# Patient Record
Sex: Female | Born: 1960 | Race: Black or African American | Hispanic: No | Smoking: Current every day smoker
Health system: Southern US, Community
[De-identification: ages and names within clinical notes are randomized; demographics above are authoritative.]

## PROBLEM LIST (undated history)

## (undated) DIAGNOSIS — R002 Palpitations: Secondary | ICD-10-CM

## (undated) DIAGNOSIS — Z9221 Personal history of antineoplastic chemotherapy: Secondary | ICD-10-CM

## (undated) DIAGNOSIS — F1290 Cannabis use, unspecified, uncomplicated: Secondary | ICD-10-CM

## (undated) DIAGNOSIS — G4733 Obstructive sleep apnea (adult) (pediatric): Secondary | ICD-10-CM

## (undated) DIAGNOSIS — G5603 Carpal tunnel syndrome, bilateral upper limbs: Secondary | ICD-10-CM

## (undated) DIAGNOSIS — Z9889 Other specified postprocedural states: Secondary | ICD-10-CM

## (undated) DIAGNOSIS — I209 Angina pectoris, unspecified: Secondary | ICD-10-CM

## (undated) DIAGNOSIS — R413 Other amnesia: Secondary | ICD-10-CM

## (undated) DIAGNOSIS — G5602 Carpal tunnel syndrome, left upper limb: Secondary | ICD-10-CM

## (undated) DIAGNOSIS — G935 Compression of brain: Secondary | ICD-10-CM

## (undated) DIAGNOSIS — Z7982 Long term (current) use of aspirin: Secondary | ICD-10-CM

## (undated) DIAGNOSIS — E559 Vitamin D deficiency, unspecified: Secondary | ICD-10-CM

## (undated) DIAGNOSIS — E119 Type 2 diabetes mellitus without complications: Secondary | ICD-10-CM

## (undated) DIAGNOSIS — C801 Malignant (primary) neoplasm, unspecified: Secondary | ICD-10-CM

## (undated) DIAGNOSIS — C50919 Malignant neoplasm of unspecified site of unspecified female breast: Secondary | ICD-10-CM

## (undated) DIAGNOSIS — R519 Headache, unspecified: Secondary | ICD-10-CM

## (undated) DIAGNOSIS — I2510 Atherosclerotic heart disease of native coronary artery without angina pectoris: Secondary | ICD-10-CM

## (undated) DIAGNOSIS — I700 Atherosclerosis of aorta: Secondary | ICD-10-CM

## (undated) DIAGNOSIS — I951 Orthostatic hypotension: Secondary | ICD-10-CM

## (undated) DIAGNOSIS — D72829 Elevated white blood cell count, unspecified: Secondary | ICD-10-CM

## (undated) DIAGNOSIS — E785 Hyperlipidemia, unspecified: Secondary | ICD-10-CM

## (undated) DIAGNOSIS — A599 Trichomoniasis, unspecified: Secondary | ICD-10-CM

## (undated) DIAGNOSIS — F17200 Nicotine dependence, unspecified, uncomplicated: Secondary | ICD-10-CM

## (undated) DIAGNOSIS — R011 Cardiac murmur, unspecified: Secondary | ICD-10-CM

## (undated) DIAGNOSIS — F32A Depression, unspecified: Secondary | ICD-10-CM

## (undated) DIAGNOSIS — A048 Other specified bacterial intestinal infections: Secondary | ICD-10-CM

## (undated) DIAGNOSIS — M810 Age-related osteoporosis without current pathological fracture: Secondary | ICD-10-CM

## (undated) DIAGNOSIS — M8580 Other specified disorders of bone density and structure, unspecified site: Secondary | ICD-10-CM

## (undated) DIAGNOSIS — M1990 Unspecified osteoarthritis, unspecified site: Secondary | ICD-10-CM

## (undated) DIAGNOSIS — D582 Other hemoglobinopathies: Secondary | ICD-10-CM

## (undated) DIAGNOSIS — I10 Essential (primary) hypertension: Secondary | ICD-10-CM

## (undated) DIAGNOSIS — J449 Chronic obstructive pulmonary disease, unspecified: Secondary | ICD-10-CM

## (undated) DIAGNOSIS — M160 Bilateral primary osteoarthritis of hip: Secondary | ICD-10-CM

## (undated) DIAGNOSIS — M5416 Radiculopathy, lumbar region: Secondary | ICD-10-CM

## (undated) DIAGNOSIS — F331 Major depressive disorder, recurrent, moderate: Secondary | ICD-10-CM

## (undated) DIAGNOSIS — Z972 Presence of dental prosthetic device (complete) (partial): Secondary | ICD-10-CM

## (undated) DIAGNOSIS — R001 Bradycardia, unspecified: Secondary | ICD-10-CM

## (undated) DIAGNOSIS — C50912 Malignant neoplasm of unspecified site of left female breast: Secondary | ICD-10-CM

## (undated) DIAGNOSIS — D649 Anemia, unspecified: Secondary | ICD-10-CM

## (undated) DIAGNOSIS — J8410 Pulmonary fibrosis, unspecified: Secondary | ICD-10-CM

## (undated) DIAGNOSIS — I739 Peripheral vascular disease, unspecified: Secondary | ICD-10-CM

## (undated) DIAGNOSIS — Z8669 Personal history of other diseases of the nervous system and sense organs: Secondary | ICD-10-CM

## (undated) HISTORY — PX: ANKLE FRACTURE SURGERY: SHX122

## (undated) HISTORY — PX: EYE SURGERY: SHX253

## (undated) HISTORY — PX: WISDOM TOOTH EXTRACTION: SHX21

## (undated) HISTORY — PX: ABDOMINAL HYSTERECTOMY: SHX81

## (undated) HISTORY — PX: OTHER SURGICAL HISTORY: SHX169

## (undated) HISTORY — DX: Essential (primary) hypertension: I10

---

## 2004-06-11 ENCOUNTER — Ambulatory Visit: Payer: Self-pay | Admitting: Family Medicine

## 2004-06-18 ENCOUNTER — Ambulatory Visit: Payer: Self-pay | Admitting: Family Medicine

## 2004-07-23 ENCOUNTER — Ambulatory Visit: Payer: Self-pay | Admitting: Obstetrics and Gynecology

## 2004-07-29 ENCOUNTER — Inpatient Hospital Stay: Payer: Self-pay | Admitting: Obstetrics and Gynecology

## 2004-11-23 ENCOUNTER — Other Ambulatory Visit: Payer: Self-pay

## 2004-11-23 ENCOUNTER — Emergency Department: Payer: Self-pay | Admitting: Emergency Medicine

## 2005-04-10 ENCOUNTER — Emergency Department: Payer: Self-pay | Admitting: Emergency Medicine

## 2005-04-11 ENCOUNTER — Emergency Department: Payer: Self-pay | Admitting: General Practice

## 2005-11-13 ENCOUNTER — Ambulatory Visit: Payer: Self-pay | Admitting: Family Medicine

## 2006-05-26 DIAGNOSIS — C50919 Malignant neoplasm of unspecified site of unspecified female breast: Secondary | ICD-10-CM

## 2006-05-26 DIAGNOSIS — C50912 Malignant neoplasm of unspecified site of left female breast: Secondary | ICD-10-CM

## 2006-05-26 HISTORY — DX: Malignant neoplasm of unspecified site of unspecified female breast: C50.919

## 2006-05-26 HISTORY — DX: Malignant neoplasm of unspecified site of left female breast: C50.912

## 2006-05-26 HISTORY — PX: MASTECTOMY: SHX3

## 2006-11-25 ENCOUNTER — Ambulatory Visit: Payer: Self-pay | Admitting: Family Medicine

## 2007-01-19 ENCOUNTER — Other Ambulatory Visit: Payer: Self-pay

## 2007-01-19 ENCOUNTER — Ambulatory Visit: Payer: Self-pay | Admitting: General Surgery

## 2007-01-19 IMAGING — CR DG CHEST 2V
1 series · 2 of 2 positions shown · non-contrast
Comparison: none

REASON FOR EXAM: htn,cardiac disease
COMMENTS:

PROCEDURE:     DXR - DXR CHEST PA (OR AP) AND LATERAL  - [DATE] [DATE]
RESULT:     Comparison is made to the prior exam of [DATE]. The lung
fields are clear. The heart, mediastinal and osseous structures are normal
in appearance.

[Series 1: view not recorded · 0.17mm/px · 2 of 2 slices shown]
[im 1/2]
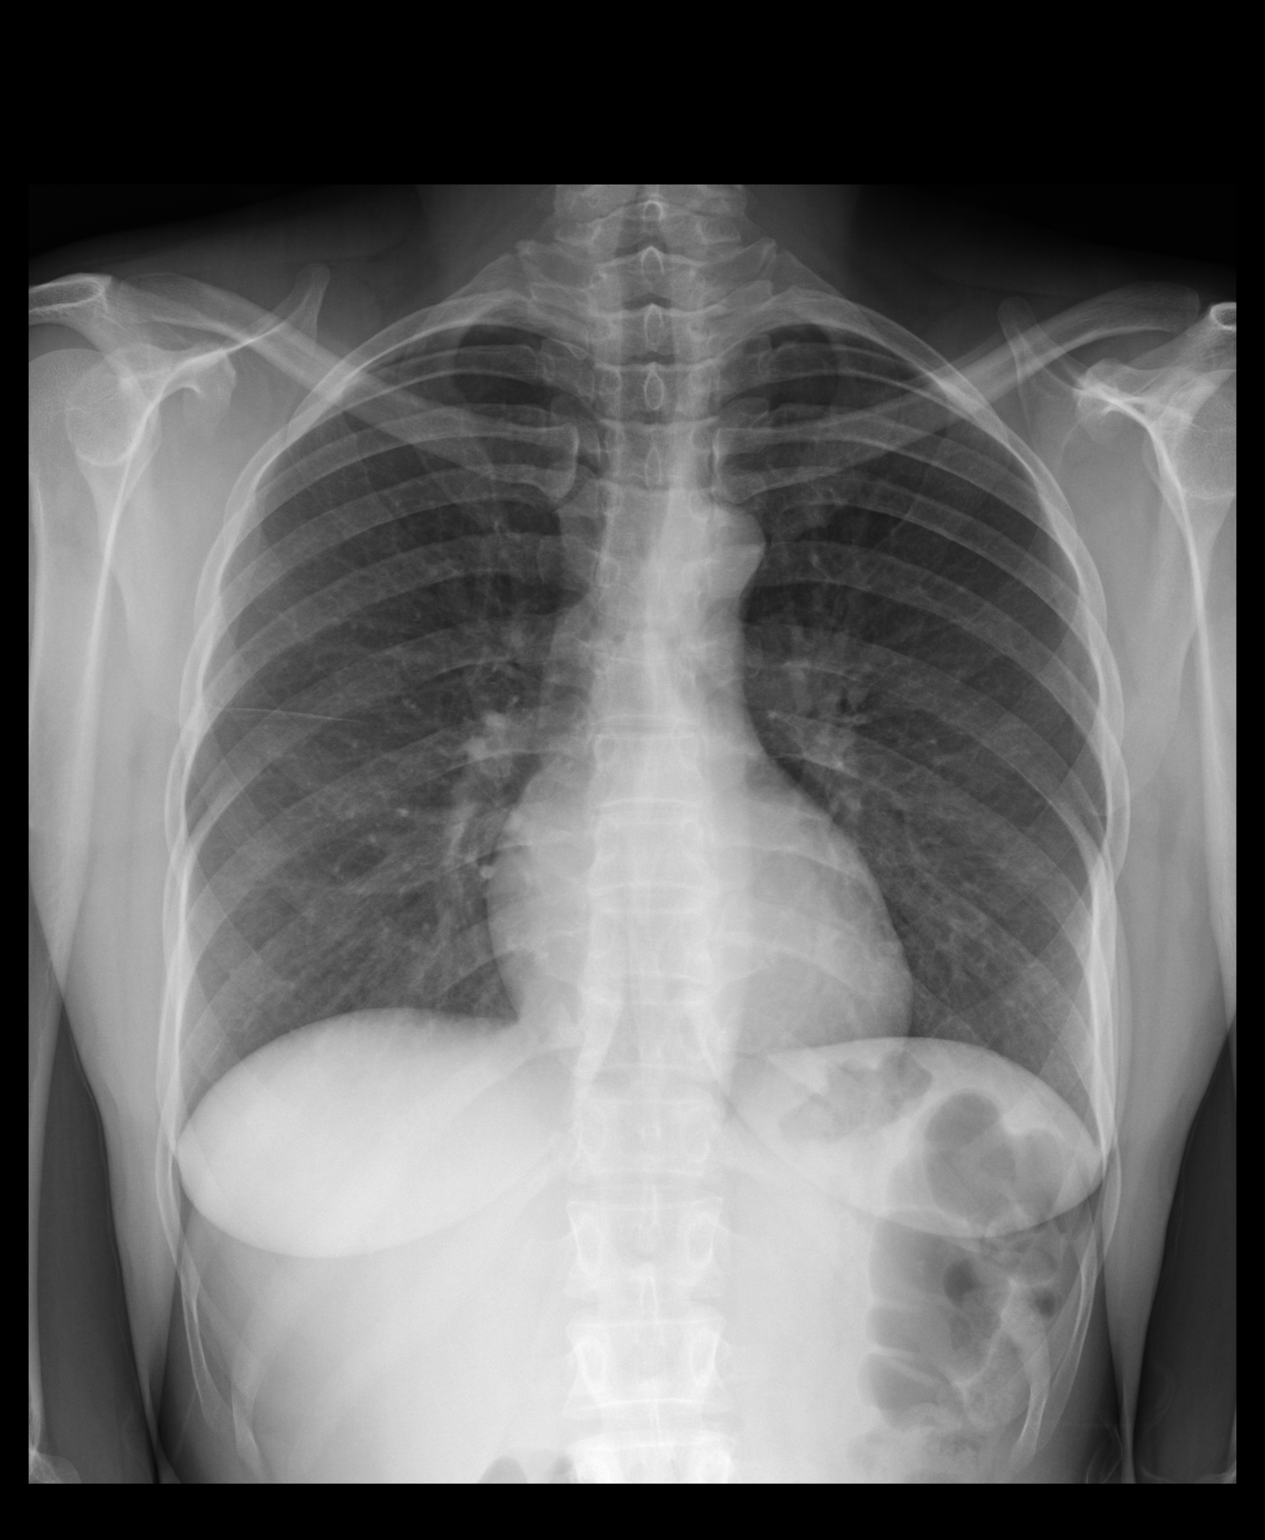
[im 2/2]
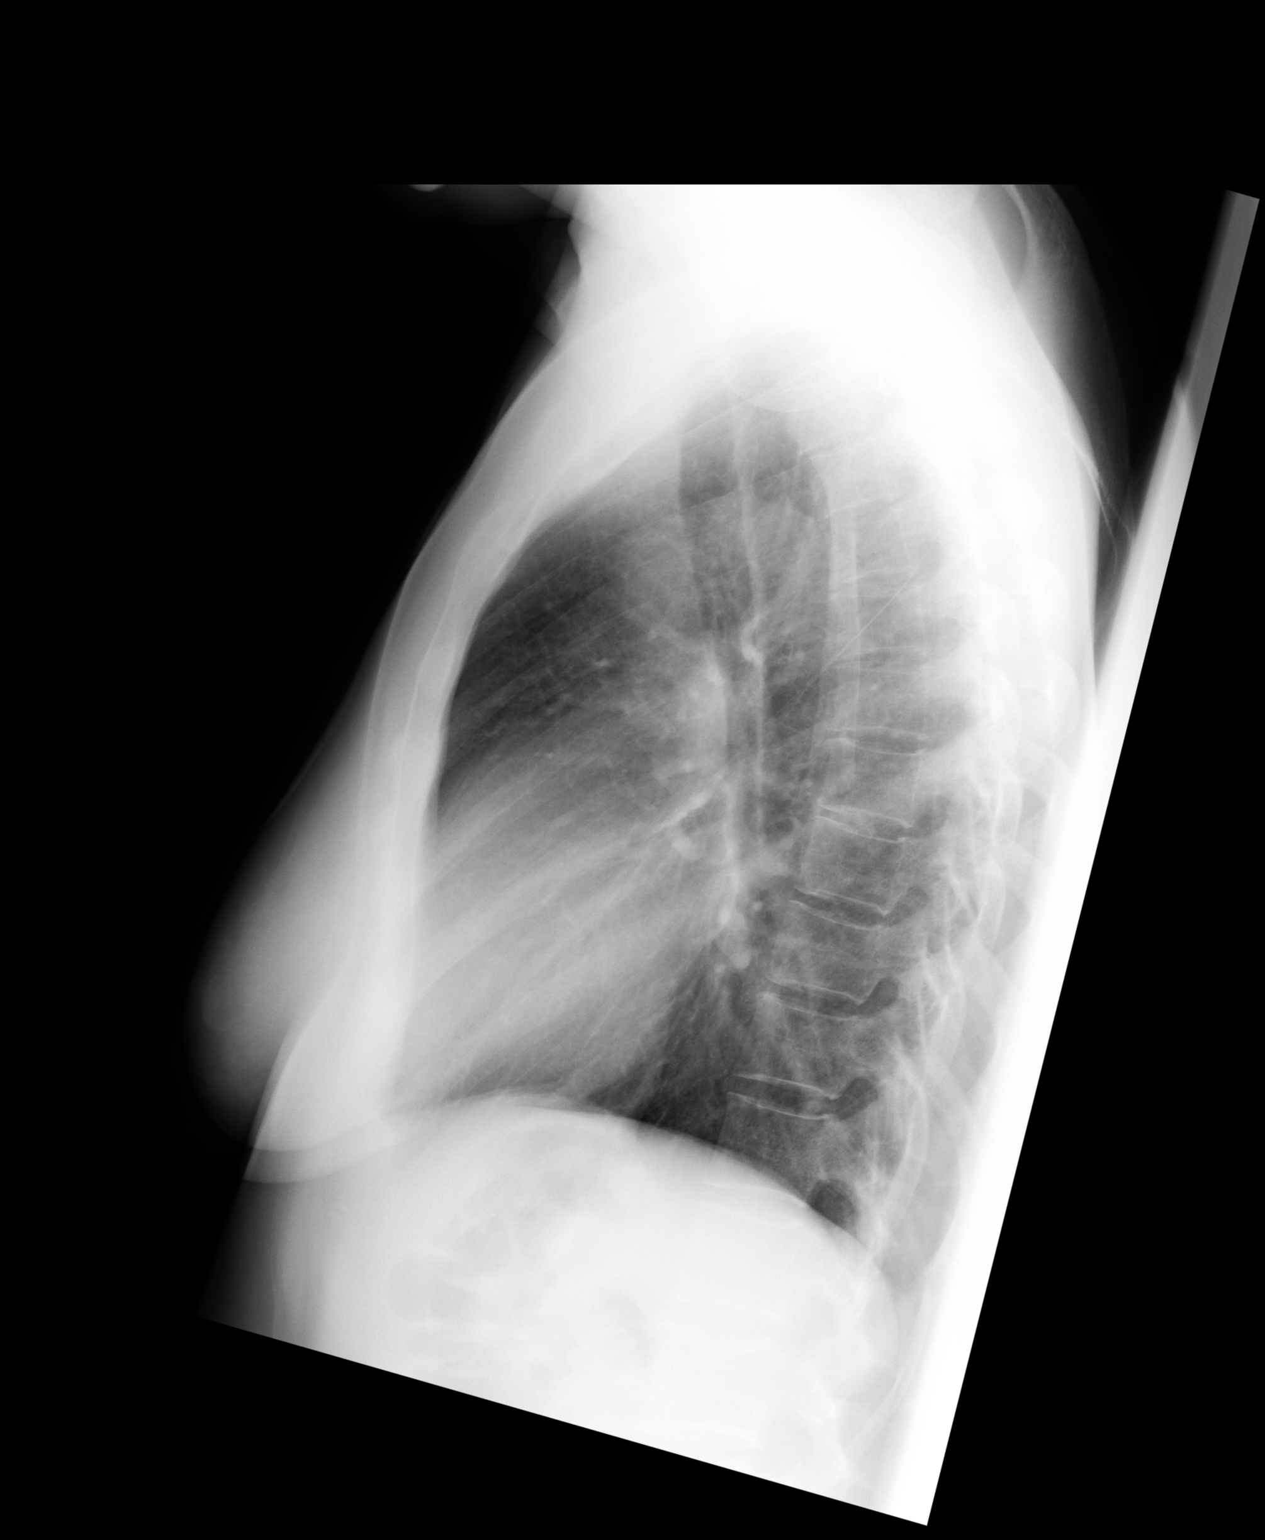

[2 of 2 positions shown; findings below may reference images not displayed]

IMPRESSION: No significant abnormalities are noted.

## 2007-01-25 ENCOUNTER — Ambulatory Visit: Payer: Self-pay | Admitting: Oncology

## 2007-01-26 ENCOUNTER — Inpatient Hospital Stay: Payer: Self-pay | Admitting: General Surgery

## 2007-02-16 ENCOUNTER — Ambulatory Visit: Payer: Self-pay | Admitting: Oncology

## 2007-02-23 ENCOUNTER — Ambulatory Visit: Payer: Self-pay | Admitting: General Surgery

## 2007-02-24 ENCOUNTER — Ambulatory Visit: Payer: Self-pay | Admitting: Oncology

## 2007-03-27 ENCOUNTER — Ambulatory Visit: Payer: Self-pay | Admitting: Oncology

## 2007-04-26 ENCOUNTER — Ambulatory Visit: Payer: Self-pay | Admitting: Oncology

## 2007-05-27 ENCOUNTER — Ambulatory Visit: Payer: Self-pay | Admitting: Oncology

## 2007-06-27 ENCOUNTER — Ambulatory Visit: Payer: Self-pay | Admitting: Oncology

## 2007-07-25 ENCOUNTER — Ambulatory Visit: Payer: Self-pay | Admitting: Oncology

## 2007-08-02 ENCOUNTER — Inpatient Hospital Stay: Payer: Self-pay | Admitting: Oncology

## 2007-08-02 ENCOUNTER — Other Ambulatory Visit: Payer: Self-pay

## 2007-08-03 ENCOUNTER — Other Ambulatory Visit: Payer: Self-pay

## 2007-08-09 ENCOUNTER — Inpatient Hospital Stay: Payer: Self-pay | Admitting: *Deleted

## 2007-08-09 ENCOUNTER — Other Ambulatory Visit: Payer: Self-pay

## 2007-08-09 DIAGNOSIS — I2111 ST elevation (STEMI) myocardial infarction involving right coronary artery: Secondary | ICD-10-CM

## 2007-08-09 HISTORY — PX: CORONARY ANGIOPLASTY WITH STENT PLACEMENT: SHX49

## 2007-08-09 HISTORY — DX: ST elevation (STEMI) myocardial infarction involving right coronary artery: I21.11

## 2007-08-25 ENCOUNTER — Ambulatory Visit: Payer: Self-pay | Admitting: Oncology

## 2007-09-24 ENCOUNTER — Ambulatory Visit: Payer: Self-pay | Admitting: Oncology

## 2007-10-25 ENCOUNTER — Ambulatory Visit: Payer: Self-pay | Admitting: Oncology

## 2007-11-02 ENCOUNTER — Ambulatory Visit: Payer: Self-pay | Admitting: Oncology

## 2007-11-24 ENCOUNTER — Ambulatory Visit: Payer: Self-pay | Admitting: Oncology

## 2007-12-01 ENCOUNTER — Ambulatory Visit: Payer: Self-pay | Admitting: General Surgery

## 2007-12-09 ENCOUNTER — Ambulatory Visit: Payer: Self-pay | Admitting: Oncology

## 2007-12-25 ENCOUNTER — Ambulatory Visit: Payer: Self-pay | Admitting: Oncology

## 2007-12-30 ENCOUNTER — Other Ambulatory Visit: Payer: Self-pay

## 2007-12-30 ENCOUNTER — Ambulatory Visit: Payer: Self-pay | Admitting: Orthopedic Surgery

## 2008-01-07 ENCOUNTER — Ambulatory Visit: Payer: Self-pay | Admitting: Orthopedic Surgery

## 2008-01-25 ENCOUNTER — Ambulatory Visit: Payer: Self-pay | Admitting: Oncology

## 2008-02-24 ENCOUNTER — Ambulatory Visit: Payer: Self-pay | Admitting: Oncology

## 2008-03-09 ENCOUNTER — Ambulatory Visit: Payer: Self-pay | Admitting: Oncology

## 2008-03-26 ENCOUNTER — Ambulatory Visit: Payer: Self-pay | Admitting: Oncology

## 2008-04-25 ENCOUNTER — Ambulatory Visit: Payer: Self-pay | Admitting: Oncology

## 2008-05-26 ENCOUNTER — Ambulatory Visit: Payer: Self-pay | Admitting: Oncology

## 2008-05-26 DIAGNOSIS — I219 Acute myocardial infarction, unspecified: Secondary | ICD-10-CM

## 2008-05-26 HISTORY — DX: Acute myocardial infarction, unspecified: I21.9

## 2008-06-08 ENCOUNTER — Ambulatory Visit: Payer: Self-pay | Admitting: Oncology

## 2008-06-26 ENCOUNTER — Ambulatory Visit: Payer: Self-pay | Admitting: Oncology

## 2008-08-24 ENCOUNTER — Ambulatory Visit: Payer: Self-pay | Admitting: Oncology

## 2008-09-07 ENCOUNTER — Ambulatory Visit: Payer: Self-pay | Admitting: Oncology

## 2008-09-23 ENCOUNTER — Ambulatory Visit: Payer: Self-pay | Admitting: Oncology

## 2008-11-08 ENCOUNTER — Ambulatory Visit: Payer: Self-pay | Admitting: Gastroenterology

## 2008-11-23 ENCOUNTER — Ambulatory Visit: Payer: Self-pay | Admitting: Oncology

## 2008-12-07 ENCOUNTER — Ambulatory Visit: Payer: Self-pay | Admitting: Oncology

## 2008-12-24 ENCOUNTER — Ambulatory Visit: Payer: Self-pay | Admitting: Oncology

## 2009-03-26 ENCOUNTER — Ambulatory Visit: Payer: Self-pay | Admitting: Oncology

## 2009-04-12 ENCOUNTER — Ambulatory Visit: Payer: Self-pay | Admitting: Oncology

## 2009-04-25 ENCOUNTER — Ambulatory Visit: Payer: Self-pay | Admitting: Oncology

## 2009-06-12 ENCOUNTER — Ambulatory Visit: Payer: Self-pay | Admitting: General Surgery

## 2009-07-03 ENCOUNTER — Ambulatory Visit: Payer: Self-pay | Admitting: General Surgery

## 2009-09-03 ENCOUNTER — Ambulatory Visit: Payer: Self-pay | Admitting: Family Medicine

## 2009-09-23 ENCOUNTER — Ambulatory Visit: Payer: Self-pay | Admitting: Oncology

## 2009-10-11 ENCOUNTER — Ambulatory Visit: Payer: Self-pay | Admitting: Oncology

## 2009-10-24 ENCOUNTER — Ambulatory Visit: Payer: Self-pay | Admitting: Oncology

## 2009-12-04 ENCOUNTER — Ambulatory Visit: Payer: Self-pay | Admitting: Oncology

## 2010-04-16 ENCOUNTER — Ambulatory Visit: Payer: Self-pay | Admitting: Oncology

## 2010-04-25 ENCOUNTER — Ambulatory Visit: Payer: Self-pay | Admitting: Oncology

## 2010-06-06 ENCOUNTER — Inpatient Hospital Stay: Payer: Self-pay | Admitting: Internal Medicine

## 2010-06-11 ENCOUNTER — Inpatient Hospital Stay: Payer: Self-pay | Admitting: Internal Medicine

## 2010-06-12 HISTORY — PX: LEFT HEART CATH AND CORONARY ANGIOGRAPHY: CATH118249

## 2010-07-04 ENCOUNTER — Ambulatory Visit: Payer: Self-pay | Admitting: Internal Medicine

## 2010-07-04 ENCOUNTER — Ambulatory Visit: Payer: Self-pay | Admitting: General Surgery

## 2010-10-15 ENCOUNTER — Ambulatory Visit: Payer: Self-pay | Admitting: Oncology

## 2010-10-25 ENCOUNTER — Ambulatory Visit: Payer: Self-pay | Admitting: Oncology

## 2010-11-01 ENCOUNTER — Emergency Department: Payer: Self-pay | Admitting: Emergency Medicine

## 2010-11-26 ENCOUNTER — Ambulatory Visit: Payer: Self-pay | Admitting: Family Medicine

## 2011-02-18 ENCOUNTER — Emergency Department: Payer: Self-pay | Admitting: Emergency Medicine

## 2011-02-27 ENCOUNTER — Ambulatory Visit: Payer: Self-pay | Admitting: Family Medicine

## 2011-04-21 ENCOUNTER — Ambulatory Visit: Payer: Self-pay | Admitting: Oncology

## 2011-04-26 ENCOUNTER — Ambulatory Visit: Payer: Self-pay | Admitting: Oncology

## 2011-05-27 ENCOUNTER — Ambulatory Visit: Payer: Self-pay | Admitting: Oncology

## 2011-07-07 ENCOUNTER — Ambulatory Visit: Payer: Self-pay | Admitting: Oncology

## 2011-10-27 ENCOUNTER — Ambulatory Visit: Payer: Self-pay | Admitting: Oncology

## 2011-10-28 LAB — CANCER ANTIGEN 27.29: CA 27.29: 18 U/mL (ref 0.0–38.6)

## 2011-11-24 ENCOUNTER — Ambulatory Visit: Payer: Self-pay | Admitting: Oncology

## 2012-07-06 ENCOUNTER — Ambulatory Visit: Payer: Self-pay | Admitting: Oncology

## 2012-08-20 ENCOUNTER — Ambulatory Visit: Payer: Self-pay | Admitting: Oncology

## 2012-08-23 LAB — CANCER ANTIGEN 27.29: CA 27.29: 18.9 U/mL (ref 0.0–38.6)

## 2012-08-24 ENCOUNTER — Ambulatory Visit: Payer: Self-pay | Admitting: Oncology

## 2012-09-23 ENCOUNTER — Ambulatory Visit: Payer: Self-pay | Admitting: Oncology

## 2012-10-24 ENCOUNTER — Ambulatory Visit: Payer: Self-pay | Admitting: Oncology

## 2012-11-01 ENCOUNTER — Ambulatory Visit: Payer: Self-pay | Admitting: Family Medicine

## 2013-04-08 ENCOUNTER — Ambulatory Visit: Payer: Self-pay | Admitting: Oncology

## 2013-04-09 LAB — CANCER ANTIGEN 27.29: CA 27.29: 17.5 U/mL (ref 0.0–38.6)

## 2013-04-25 ENCOUNTER — Ambulatory Visit: Payer: Self-pay | Admitting: Oncology

## 2013-09-27 ENCOUNTER — Ambulatory Visit: Payer: Self-pay | Admitting: Family Medicine

## 2013-11-02 ENCOUNTER — Ambulatory Visit: Payer: Self-pay | Admitting: Oncology

## 2014-04-25 ENCOUNTER — Ambulatory Visit: Payer: Self-pay | Admitting: Oncology

## 2014-05-05 LAB — CANCER ANTIGEN 27.29: CA 27.29: 21.2 U/mL (ref 0.0–38.6)

## 2014-05-26 ENCOUNTER — Ambulatory Visit: Payer: Self-pay | Admitting: Oncology

## 2014-09-08 ENCOUNTER — Other Ambulatory Visit: Payer: Self-pay | Admitting: Oncology

## 2014-09-08 DIAGNOSIS — Z853 Personal history of malignant neoplasm of breast: Secondary | ICD-10-CM

## 2015-02-28 ENCOUNTER — Ambulatory Visit
Admission: RE | Admit: 2015-02-28 | Discharge: 2015-02-28 | Disposition: A | Discharge status: Home / Self Care | Payer: Medicare Other | Source: Ambulatory Visit | Attending: Oncology | Admitting: Oncology

## 2015-02-28 ENCOUNTER — Other Ambulatory Visit: Payer: Self-pay | Admitting: Oncology

## 2015-02-28 DIAGNOSIS — Z853 Personal history of malignant neoplasm of breast: Secondary | ICD-10-CM

## 2015-02-28 DIAGNOSIS — Z1231 Encounter for screening mammogram for malignant neoplasm of breast: Secondary | ICD-10-CM | POA: Insufficient documentation

## 2015-02-28 HISTORY — DX: Malignant (primary) neoplasm, unspecified: C80.1

## 2015-02-28 HISTORY — DX: Malignant neoplasm of unspecified site of unspecified female breast: C50.919

## 2015-05-04 ENCOUNTER — Other Ambulatory Visit: Payer: Self-pay | Admitting: *Deleted

## 2015-05-04 DIAGNOSIS — C50919 Malignant neoplasm of unspecified site of unspecified female breast: Secondary | ICD-10-CM

## 2015-05-07 ENCOUNTER — Inpatient Hospital Stay: Payer: Medicare Other

## 2015-05-07 ENCOUNTER — Inpatient Hospital Stay: Payer: Medicare Other | Admitting: Oncology

## 2015-05-08 ENCOUNTER — Inpatient Hospital Stay: Payer: Medicare Other

## 2015-05-08 ENCOUNTER — Inpatient Hospital Stay: Payer: Medicare Other | Admitting: Oncology

## 2015-05-22 ENCOUNTER — Inpatient Hospital Stay: Payer: Medicare Other | Admitting: Oncology

## 2015-05-22 ENCOUNTER — Inpatient Hospital Stay: Payer: Medicare Other

## 2015-09-09 ENCOUNTER — Emergency Department
Admission: EM | Admit: 2015-09-09 | Discharge: 2015-09-09 | Disposition: A | Discharge status: Home / Self Care | Payer: Medicare Other | Attending: Emergency Medicine | Admitting: Emergency Medicine

## 2015-09-09 ENCOUNTER — Encounter: Payer: Self-pay | Admitting: Emergency Medicine

## 2015-09-09 DIAGNOSIS — H9202 Otalgia, left ear: Secondary | ICD-10-CM | POA: Diagnosis present

## 2015-09-09 DIAGNOSIS — Z853 Personal history of malignant neoplasm of breast: Secondary | ICD-10-CM | POA: Diagnosis not present

## 2015-09-09 DIAGNOSIS — H6092 Unspecified otitis externa, left ear: Secondary | ICD-10-CM | POA: Insufficient documentation

## 2015-09-09 DIAGNOSIS — F1290 Cannabis use, unspecified, uncomplicated: Secondary | ICD-10-CM | POA: Insufficient documentation

## 2015-09-09 DIAGNOSIS — H6692 Otitis media, unspecified, left ear: Secondary | ICD-10-CM | POA: Diagnosis not present

## 2015-09-09 DIAGNOSIS — F17210 Nicotine dependence, cigarettes, uncomplicated: Secondary | ICD-10-CM | POA: Insufficient documentation

## 2015-09-09 DIAGNOSIS — J029 Acute pharyngitis, unspecified: Secondary | ICD-10-CM | POA: Insufficient documentation

## 2015-09-09 LAB — POCT RAPID STREP A: Streptococcus, Group A Screen (Direct): NEGATIVE

## 2015-09-09 MED ORDER — TRAMADOL HCL 50 MG PO TABS: 50.0000 mg | ORAL_TABLET | Freq: Four times a day (QID) | ORAL | Status: DC | PRN

## 2015-09-09 MED ORDER — AMOXICILLIN 500 MG PO CAPS
500.0000 mg | ORAL_CAPSULE | Freq: Once | ORAL | Status: AC
  Administered 2015-09-09: 500 mg via ORAL

## 2015-09-09 MED ORDER — OFLOXACIN 0.3 % OT SOLN: 5.0000 [drp] | Freq: Two times a day (BID) | OTIC | Status: DC

## 2015-09-09 MED ORDER — GI COCKTAIL ~~LOC~~
30.0000 mL | Freq: Once | ORAL | Status: AC
  Administered 2015-09-09: 30 mL via ORAL
  Filled 2015-09-09: qty 30

## 2015-09-09 MED ORDER — AMOXICILLIN 500 MG PO CAPS
ORAL_CAPSULE | ORAL | Status: AC
  Filled 2015-09-09: qty 1

## 2015-09-09 MED ORDER — AMOXICILLIN 500 MG PO CAPS
ORAL_CAPSULE | ORAL | Status: AC
  Administered 2015-09-09: 500 mg via ORAL
  Filled 2015-09-09: qty 1

## 2015-09-09 MED ORDER — TRAMADOL HCL 50 MG PO TABS
50.0000 mg | ORAL_TABLET | Freq: Once | ORAL | Status: AC
Start: 2015-09-09 — End: 2015-09-09
  Administered 2015-09-09: 50 mg via ORAL
  Filled 2015-09-09: qty 1

## 2015-09-09 MED ORDER — AMOXICILLIN 500 MG PO TABS: 500.0000 mg | ORAL_TABLET | Freq: Two times a day (BID) | ORAL | Status: DC

## 2015-09-09 NOTE — ED Provider Notes (Signed)
[facility] Emergency Department Provider Note  Time seen: 9:56 PM  I have reviewed the triage vital signs and the nursing notes.   HISTORY  Chief Complaint Sore Throat; Otalgia; and Ingrown Toenail    HPI Tonya Keith is a 55 y.o. female with a past medical history of breast cancer presents the emergency department with a sore throat and left ear pain since Friday. According to the patient for the past 2 days she has had significant pain in her left ear as well as a left side of her throat. States low-grade fevers, denies vomiting. Denies headache. States she has had a mild amount of drainage from the left ear so she has been using a cotton ball in the ear for discomfort. Denies cough. Denies chest pain or abdominal pain. Patient states it's painful to swallow but no trouble breathing.     Past Medical History  Diagnosis Date  . Cancer [facility])     breast  . Breast cancer ([place]) 2008    chemo    There are no active problems to display for this patient.   Past Surgical History  Procedure Laterality Date  . Mastectomy Left 2008    No current outpatient prescriptions on file.  Allergies Pollen extract and Latex  Family History  Problem Relation Age of Onset  . Breast cancer Maternal Aunt     Social History Social History  Substance Use Topics  . Smoking status: Current Every Day Smoker -- 0.50 packs/day    Types: Cigarettes  . Smokeless tobacco: None  . Alcohol Use: Yes     Comment: occassional     Review of Systems Constitutional: Low-grade fever ENT: Left ear pain and drainage Cardiovascular: Negative for chest pain. Respiratory: Negative for shortness of breath. Negative for cough Gastrointestinal: Negative for abdominal pain Skin: Negative for rash. Neurological: Negative for headache 10-point ROS otherwise negative.  ____________________________________________   PHYSICAL EXAM:  VITAL SIGNS: ED Triage Vitals  Enc Vitals  Group     BP 09/09/15 2139 143/88 mmHg     Pulse Rate 09/09/15 2139 95     Resp 09/09/15 2139 20     Temp 09/09/15 2139 99.6 F (37.6 C)     Temp Source 09/09/15 2139 Oral     SpO2 09/09/15 2139 96 %     Weight 09/09/15 2139 112 lb (50.803 kg)     Height 09/09/15 2139 5\' 1"  (1.549 m)     Head Cir --      Peak Flow --      Pain Score 09/09/15 2140 10     Pain Loc --      Pain Edu? --      Excl. in [place]? --     Constitutional: Alert and oriented. Well appearing and in no distress. Eyes: Normal exam ENT   Head: Normocephalic and atraumatic.   Nose: No congestion/rhinnorhea.   Mouth/Throat: Mucous membranes are moist. Moderate erythema of the pharynx, no exudate or unilateral swelling, no uvular deviation. anterior cervical adenopathy left greater than right. Moderate erythema of left tympanic membrane with mild swelling of the left external auditory canal. Cardiovascular: Normal rate, regular rhythm. No murmurs, rubs, or gallops. Respiratory: Normal respiratory effort without tachypnea nor retractions. Breath sounds are clear and equal bilaterally. No wheezes/rales/rhonchi. Gastrointestinal: Soft and nontender. No distention. Musculoskeletal: Normal range of motion Neurologic:  Normal speech and language. No gross focal neurologic deficits Skin:  Skin is warm, dry and intact.  Psychiatric: Mood  and affect are normal. Speech and behavior are normal.   ____________________________________________    INITIAL IMPRESSION / ASSESSMENT AND PLAN / ED COURSE  Pertinent labs & imaging results that were available during my care of the patient were reviewed by me and considered in my medical decision making (see chart for details).  Exam consistent with pharyngitis with left otitis media/externa. We will obtain a strep swab, we'll place the patient on antibiotics amoxicillin as well as ofloxacin drops. Patient has moderate throat discomfort when swallowing, we will treat with a GI  cocktail the emergency department. I discussed supportive care with the patient including Tylenol/Motrin as well as Chloraseptic and increasing fluids. Patient agreeable.  Rapid strep negative.  ____________________________________________   FINAL CLINICAL IMPRESSION(S) / ED DIAGNOSES  Pharyngitis Left otitis media Left otitis externa   Harvest Dark, MD 09/09/15 2209

## 2015-09-09 NOTE — ED Notes (Signed)
Patient vomited Amoxicillin capsule back up.  MD ordered another capsule to try again.

## 2015-09-09 NOTE — Discharge Instructions (Signed)
Otitis Externa Otitis externa is a germ infection in the outer ear. The outer ear is the area from the eardrum to the outside of the ear. Otitis externa is sometimes called "swimmer's ear." HOME CARE  Put drops in the ear as told by your doctor.  Only take medicine as told by your doctor.  If you have diabetes, your doctor may give you more directions. Follow your doctor's directions.  Keep all doctor visits as told. To avoid another infection:  Keep your ear dry. Use the corner of a towel to dry your ear after swimming or bathing.  Avoid scratching or putting things inside your ear.  Avoid swimming in lakes, dirty water, or pools that use a chemical called chlorine poorly.  You may use ear drops after swimming. Combine equal amounts of white vinegar and alcohol in a bottle. Put 3 or 4 drops in each ear. GET HELP IF:   You have a fever.  Your ear is still red, puffy (swollen), or painful after 3 days.  You still have yellowish-white fluid (pus) coming from the ear after 3 days.  Your redness, puffiness, or pain gets worse.  You have a really bad headache.  You have redness, puffiness, pain, or tenderness behind your ear. MAKE SURE YOU:   Understand these instructions.  Will watch your condition.  Will get help right away if you are not doing well or get worse.   This information is not intended to replace advice given to you by your health care provider. Make sure you discuss any questions you have with your health care provider.   Document Released: 10/29/2007 Document Revised: 06/02/2014 Document Reviewed: 05/29/2011 Elsevier Interactive Patient Education 2016 [employer].  Otitis Media, Adult Otitis media is redness, soreness, and inflammation of the middle ear. Otitis media may be caused by allergies or, most commonly, by infection. Often it occurs as a complication of the common cold. SIGNS AND SYMPTOMS Symptoms of otitis media may  include:  Earache.  Fever.  Ringing in your ear.  Headache.  Leakage of fluid from the ear. DIAGNOSIS To diagnose otitis media, your health care provider will examine your ear with an otoscope. This is an instrument that allows your health care provider to see into your ear in order to examine your eardrum. Your health care provider also will ask you questions about your symptoms. TREATMENT  Typically, otitis media resolves on its own within 3-5 days. Your health care provider may prescribe medicine to ease your symptoms of pain. If otitis media does not resolve within 5 days or is recurrent, your health care provider may prescribe antibiotic medicines if he or she suspects that a bacterial infection is the cause. HOME CARE INSTRUCTIONS   If you were prescribed an antibiotic medicine, finish it all even if you start to feel better.  Take medicines only as directed by your health care provider.  Keep all follow-up visits as directed by your health care provider. SEEK MEDICAL CARE IF:  You have otitis media only in one ear, or bleeding from your nose, or both.  You notice a lump on your neck.  You are not getting better in 3-5 days.  You feel worse instead of better. SEEK IMMEDIATE MEDICAL CARE IF:   You have pain that is not controlled with medicine.  You have swelling, redness, or pain around your ear or stiffness in your neck.  You notice that part of your face is paralyzed.  You notice that the bone  behind your ear (mastoid) is tender when you touch it. MAKE SURE YOU:   Understand these instructions.  Will watch your condition.  Will get help right away if you are not doing well or get worse.   This information is not intended to replace advice given to you by your health care provider. Make sure you discuss any questions you have with your health care provider.   Document Released: 02/15/2004 Document Revised: 06/02/2014 Document Reviewed: 12/07/2012 Elsevier  Interactive Patient Education 2016 Elsevier Inc.  Pharyngitis Pharyngitis is redness, pain, and swelling (inflammation) of your pharynx.  CAUSES  Pharyngitis is usually caused by infection. Most of the time, these infections are from viruses (viral) and are part of a cold. However, sometimes pharyngitis is caused by bacteria (bacterial). Pharyngitis can also be caused by allergies. Viral pharyngitis may be spread from person to person by coughing, sneezing, and personal items or utensils (cups, forks, spoons, toothbrushes). Bacterial pharyngitis may be spread from person to person by more intimate contact, such as kissing.  SIGNS AND SYMPTOMS  Symptoms of pharyngitis include:   Sore throat.   Tiredness (fatigue).   Low-grade fever.   Headache.  Joint pain and muscle aches.  Skin rashes.  Swollen lymph nodes.  Plaque-like film on throat or tonsils (often seen with bacterial pharyngitis). DIAGNOSIS  Your health care provider will ask you questions about your illness and your symptoms. Your medical history, along with a physical exam, is often all that is needed to diagnose pharyngitis. Sometimes, a rapid strep test is done. Other lab tests may also be done, depending on the suspected cause.  TREATMENT  Viral pharyngitis will usually get better in 3-4 days without the use of medicine. Bacterial pharyngitis is treated with medicines that kill germs (antibiotics).  HOME CARE INSTRUCTIONS   Drink enough water and fluids to keep your urine clear or pale yellow.   Only take over-the-counter or prescription medicines as directed by your health care provider:   If you are prescribed antibiotics, make sure you finish them even if you start to feel better.   Do not take aspirin.   Get lots of rest.   Gargle with 8 oz of salt water ( tsp of salt per 1 qt of water) as often as every 1-2 hours to soothe your throat.   Throat lozenges (if you are not at risk for choking) or  sprays may be used to soothe your throat. SEEK MEDICAL CARE IF:   You have large, tender lumps in your neck.  You have a rash.  You cough up green, yellow-brown, or bloody spit. SEEK IMMEDIATE MEDICAL CARE IF:   Your neck becomes stiff.  You drool or are unable to swallow liquids.  You vomit or are unable to keep medicines or liquids down.  You have severe pain that does not go away with the use of recommended medicines.  You have trouble breathing (not caused by a stuffy nose). MAKE SURE YOU:   Understand these instructions.  Will watch your condition.  Will get help right away if you are not doing well or get worse.   This information is not intended to replace advice given to you by your health care provider. Make sure you discuss any questions you have with your health care provider.   Document Released: 05/12/2005 Document Revised: 03/02/2013 Document Reviewed: 01/17/2013 Elsevier Interactive Patient Education [employer].

## 2015-09-09 NOTE — ED Notes (Signed)
Pt in with complaints of sore throat, left ear pain since Friday; pt reports difficulty swallowing due to pain.  Pt reports nausea/vomiting x2 today.  Pt reports fever 101 last night.  Pt A/Ox4, temp 99.6, other vitals WDL.

## 2015-09-11 LAB — CULTURE, GROUP A STREP (THRC)

## 2016-01-29 ENCOUNTER — Other Ambulatory Visit: Payer: Self-pay | Admitting: Family Medicine

## 2016-01-29 DIAGNOSIS — Z1231 Encounter for screening mammogram for malignant neoplasm of breast: Secondary | ICD-10-CM

## 2016-03-05 ENCOUNTER — Ambulatory Visit
Admission: RE | Admit: 2016-03-05 | Discharge: 2016-03-05 | Disposition: A | Discharge status: Home / Self Care | Payer: Medicare Other | Source: Ambulatory Visit | Attending: Family Medicine | Admitting: Family Medicine

## 2016-03-05 ENCOUNTER — Other Ambulatory Visit: Payer: Self-pay | Admitting: Family Medicine

## 2016-03-05 DIAGNOSIS — Z1231 Encounter for screening mammogram for malignant neoplasm of breast: Secondary | ICD-10-CM

## 2016-03-05 DIAGNOSIS — Z9012 Acquired absence of left breast and nipple: Secondary | ICD-10-CM | POA: Insufficient documentation

## 2016-09-30 ENCOUNTER — Other Ambulatory Visit: Payer: Self-pay | Admitting: Family Medicine

## 2016-09-30 DIAGNOSIS — M810 Age-related osteoporosis without current pathological fracture: Secondary | ICD-10-CM

## 2016-10-31 ENCOUNTER — Ambulatory Visit
Admission: RE | Admit: 2016-10-31 | Discharge: 2016-10-31 | Disposition: A | Discharge status: Home / Self Care | Payer: Medicare Other | Source: Ambulatory Visit | Attending: Family Medicine | Admitting: Family Medicine

## 2016-10-31 ENCOUNTER — Other Ambulatory Visit: Payer: Self-pay | Admitting: Family Medicine

## 2016-10-31 DIAGNOSIS — M25561 Pain in right knee: Secondary | ICD-10-CM

## 2016-11-27 ENCOUNTER — Other Ambulatory Visit: Payer: Medicare Other

## 2017-01-28 ENCOUNTER — Other Ambulatory Visit: Payer: Self-pay | Admitting: Family Medicine

## 2017-01-28 DIAGNOSIS — Z1231 Encounter for screening mammogram for malignant neoplasm of breast: Secondary | ICD-10-CM

## 2017-03-10 ENCOUNTER — Ambulatory Visit
Admission: RE | Admit: 2017-03-10 | Discharge: 2017-03-10 | Disposition: A | Discharge status: Home / Self Care | Payer: Medicare Other | Source: Ambulatory Visit | Attending: Family Medicine | Admitting: Family Medicine

## 2017-03-10 DIAGNOSIS — Z1231 Encounter for screening mammogram for malignant neoplasm of breast: Secondary | ICD-10-CM | POA: Diagnosis not present

## 2017-11-17 ENCOUNTER — Ambulatory Visit (INDEPENDENT_AMBULATORY_CARE_PROVIDER_SITE_OTHER): Payer: Medicare HMO | Admitting: Vascular Surgery

## 2017-11-17 ENCOUNTER — Encounter (INDEPENDENT_AMBULATORY_CARE_PROVIDER_SITE_OTHER): Payer: Self-pay | Admitting: Vascular Surgery

## 2017-11-17 VITALS — BP 111/71 | HR 71 | Resp 13 | Ht 60.0 in | Wt 122.0 lb

## 2017-11-17 DIAGNOSIS — M79605 Pain in left leg: Secondary | ICD-10-CM | POA: Diagnosis not present

## 2017-11-17 DIAGNOSIS — E785 Hyperlipidemia, unspecified: Secondary | ICD-10-CM

## 2017-11-17 DIAGNOSIS — M79604 Pain in right leg: Secondary | ICD-10-CM

## 2017-11-17 DIAGNOSIS — E7800 Pure hypercholesterolemia, unspecified: Secondary | ICD-10-CM | POA: Insufficient documentation

## 2017-11-17 DIAGNOSIS — R600 Localized edema: Secondary | ICD-10-CM | POA: Diagnosis not present

## 2017-11-17 DIAGNOSIS — F17200 Nicotine dependence, unspecified, uncomplicated: Secondary | ICD-10-CM | POA: Insufficient documentation

## 2017-11-17 DIAGNOSIS — I10 Essential (primary) hypertension: Secondary | ICD-10-CM | POA: Insufficient documentation

## 2017-11-17 DIAGNOSIS — G8929 Other chronic pain: Secondary | ICD-10-CM | POA: Insufficient documentation

## 2017-11-17 DIAGNOSIS — Z720 Tobacco use: Secondary | ICD-10-CM | POA: Insufficient documentation

## 2017-11-17 NOTE — Progress Notes (Signed)
Subjective:    Patient ID: Tonya Keith, female    DOB: 08-04-1960, 57 y.o.   MRN: [id number] Chief Complaint  Patient presents with  . New Patient (Initial Visit)    Claudication   Presents as a new patient referred by Dr. Clide Deutscher for evaluation of bilateral lower extremity pain.  The patient endorses a one-time episode of bilateral calf pain associated with some swelling which occurred approximately 3 weeks ago.  The patient denies any cramping to the buttocks, the thigh or the calves.  Describes his discomfort as a "pain" in her calves with activity.  The patient also notes that her bilateral ankles were "swollen".  The patient notes that she does elevate her legs on a daily basis.  The patient does not wear medical grade 1 compression socks.  The patient denies any continued claudication-like symptoms, rest pain or ulceration to the bilateral lower extremity.  Patient denies any recent surgery or trauma to the bilateral lower extremity.  Patient notes that she is very active and exercises a lot.  Denies any erythema to the bilateral lower extremity.  Patient denies any fever, nausea vomiting.  Review of Systems  Constitutional: Negative.   HENT: Negative.   Eyes: Negative.   Respiratory: Negative.   Cardiovascular: Positive for leg swelling.       Bilateral calf pain  Gastrointestinal: Negative.   Endocrine: Negative.   Genitourinary: Negative.   Musculoskeletal: Negative.   Skin: Negative.   Allergic/Immunologic: Negative.   Neurological: Negative.   Hematological: Negative.   Psychiatric/Behavioral: Negative.       Objective:   Physical Exam  Constitutional: She is oriented to person, place, and time. She appears well-developed and well-nourished. No distress.  HENT:  Head: Normocephalic and atraumatic.  Right Ear: External ear normal.  Left Ear: External ear normal.  Eyes: Pupils are equal, round, and reactive to light. Conjunctivae and EOM are normal.  Neck: Normal range  of motion.  Cardiovascular: Normal rate, regular rhythm, normal heart sounds and intact distal pulses.  Pulses:      Radial pulses are 2+ on the right side, and 2+ on the left side.       Dorsalis pedis pulses are 1+ on the right side, and 1+ on the left side.       Posterior tibial pulses are 1+ on the right side, and 1+ on the left side.  Pulmonary/Chest: Effort normal and breath sounds normal.  Musculoskeletal: Normal range of motion. She exhibits edema (Mild nonpitting edema noted to the bilateral ankles).  Neurological: She is alert and oriented to person, place, and time.  Skin: Skin is warm and dry. She is not diaphoretic.  There is no stasis dermatitis, fibrosis, cellulitis or ulcerations noted to the bilateral lower extremity.  There are no varicosities noted.  Psychiatric: She has a normal mood and affect. Her behavior is normal. Judgment and thought content normal.  Vitals reviewed.  BP 111/71 (BP Location: Right Arm, Patient Position: Sitting)   Pulse 71   Resp 13   Ht 5' (1.524 m)   Wt 122 lb (55.3 kg)   BMI 23.83 kg/m   Past Medical History:  Diagnosis Date  . Breast cancer ([place]) 2008   chemo mastectomy left  . Cancer [facility])    breast  . Hypertension    Social History   Socioeconomic History  . Marital status: Divorced    Spouse name: Not on file  . Number of children: Not on file  .  Years of education: Not on file  . Highest education level: Not on file  Occupational History  . Not on file  Social Needs  . Financial resource strain: Not on file  . Food insecurity:    Worry: Not on file    Inability: Not on file  . Transportation needs:    Medical: Not on file    Non-medical: Not on file  Tobacco Use  . Smoking status: Current Every Day Smoker    Packs/day: 0.50    Types: Cigarettes  . Smokeless tobacco: Never Used  Substance and Sexual Activity  . Alcohol use: Yes    Comment: occassional   . Drug use: Yes    Types: Marijuana    Comment: "every  once in a while"  . Sexual activity: Not on file  Lifestyle  . Physical activity:    Days per week: Not on file    Minutes per session: Not on file  . Stress: Not on file  Relationships  . Social connections:    Talks on phone: Not on file    Gets together: Not on file    Attends religious service: Not on file    Active member of club or organization: Not on file    Attends meetings of clubs or organizations: Not on file    Relationship status: Not on file  . Intimate partner violence:    Fear of current or ex partner: Not on file    Emotionally abused: Not on file    Physically abused: Not on file    Forced sexual activity: Not on file  Other Topics Concern  . Not on file  Social History Narrative  . Not on file   Past Surgical History:  Procedure Laterality Date  . ABDOMINAL HYSTERECTOMY    . MASTECTOMY Left 2008   Family History  Problem Relation Age of Onset  . Breast cancer Maternal Aunt   . Heart disease Father   . Hypertension Father   . Cancer Sister    Allergies  Allergen Reactions  . Pollen Extract   . Latex Itching and Rash      Assessment & Plan:  Presents as a new patient referred by Dr. Clide Deutscher for evaluation of bilateral lower extremity pain.  The patient endorses a one-time episode of bilateral calf pain associated with some swelling which occurred approximately 3 weeks ago.  The patient denies any cramping to the buttocks, the thigh or the calves.  Describes his discomfort as a "pain" in her calves with activity.  The patient also notes that her bilateral ankles were "swollen".  The patient notes that she does elevate her legs on a daily basis.  The patient does not wear medical grade 1 compression socks.  The patient denies any continued claudication-like symptoms, rest pain or ulceration to the bilateral lower extremity.  Patient denies any recent surgery or trauma to the bilateral lower extremity.  Patient notes that she is very active and exercises a  lot.  Denies any erythema to the bilateral lower extremity.  Patient denies any fever, nausea vomiting.  1. Bilateral lower extremity pain - New Patient has multiple risk factors for peripheral artery disease Presents with bilateral calf pain Faint pedal pulses on exam I will have the patient undergo a bilateral ABI to assess for any contributing peripheral artery disease I have discussed with the patient at length the risk factors for and pathogenesis of atherosclerotic disease and encouraged a healthy diet, regular exercise regimen and  blood pressure / glucose control.  The patient was encouraged to call the office in the interim if he experiences any claudication like symptoms, rest pain or ulcers to his feet / toes.  - US Arterial Seg Multiple; Future  2. Bilateral lower extremity edema - New The patient was encouraged to wear graduated compression stockings (20-30 mmHg) on a daily basis. The patient was instructed to begin wearing the stockings first thing in the morning and removing them in the evening. The patient was instructed specifically not to sleep in the stockings. Prescription given.  In addition, behavioral modification including elevation during the day will be initiated. Anti-inflammatories for pain. The patient will follow up in three months to asses conservative management.  Information on chronic venous insufficiency and compression stockings was given to the patient. The patient was instructed to call the office in the interim if any worsening edema or ulcerations to the legs, feet or toes occurs. The patient expresses their understanding.  3. Tobacco abuse - Stable We had a discussion for approximately 5 minutes regarding the absolute need for smoking cessation due to the deleterious nature of tobacco on the vascular system. We discussed the tobacco use would diminish patency of any intervention, and likely significantly worsen progressio of disease. We discussed multiple  agents for quitting including replacement therapy or medications to reduce cravings such as Chantix. The patient voices their understanding of the importance of smoking cessation.  4. Essential hypertension - Stable Encouraged good control as its slows the progression of atherosclerotic disease  5. Hyperlipidemia, unspecified hyperlipidemia type - Stable Encouraged good control as its slows the progression of atherosclerotic disease  Current Outpatient Medications on File Prior to Visit  Medication Sig Dispense Refill  . amitriptyline (ELAVIL) 25 MG tablet     . amLODipine (NORVASC) 10 MG tablet     . aspirin EC 81 MG tablet Take by mouth.    . cetirizine (ZYRTEC) 10 MG tablet Take by mouth.    . cloNIDine (CATAPRES) 0.2 MG tablet     . losartan-hydrochlorothiazide (HYZAAR) 100-12.5 MG tablet Take by mouth.    . lovastatin (MEVACOR) 40 MG tablet Take by mouth.    . metoprolol succinate (TOPROL-XL) 100 MG 24 hr tablet     . ofloxacin (FLOXIN) 0.3 % otic solution Place 5 drops into the left ear 2 (two) times daily. For 7 days 5 mL 0  . exemestane (AROMASIN) 25 MG tablet Take by mouth.     No current facility-administered medications on file prior to visit.    There are no Patient Instructions on file for this visit. No follow-ups on file.  [employer] A Brailyn Killion, PA-C

## 2017-12-03 ENCOUNTER — Emergency Department: Payer: Medicare Other

## 2017-12-03 ENCOUNTER — Inpatient Hospital Stay
Admission: EM | Admit: 2017-12-03 | Discharge: 2017-12-04 | DRG: 923 | Disposition: A | Discharge status: Home / Self Care | Payer: Medicare Other | Attending: Internal Medicine | Admitting: Internal Medicine

## 2017-12-03 ENCOUNTER — Other Ambulatory Visit: Payer: Self-pay

## 2017-12-03 DIAGNOSIS — F17210 Nicotine dependence, cigarettes, uncomplicated: Secondary | ICD-10-CM | POA: Diagnosis present

## 2017-12-03 DIAGNOSIS — I10 Essential (primary) hypertension: Secondary | ICD-10-CM | POA: Diagnosis not present

## 2017-12-03 DIAGNOSIS — Z9012 Acquired absence of left breast and nipple: Secondary | ICD-10-CM

## 2017-12-03 DIAGNOSIS — T68XXXA Hypothermia, initial encounter: Principal | ICD-10-CM | POA: Diagnosis present

## 2017-12-03 DIAGNOSIS — Z91048 Other nonmedicinal substance allergy status: Secondary | ICD-10-CM

## 2017-12-03 DIAGNOSIS — C50912 Malignant neoplasm of unspecified site of left female breast: Secondary | ICD-10-CM | POA: Diagnosis not present

## 2017-12-03 DIAGNOSIS — Z7982 Long term (current) use of aspirin: Secondary | ICD-10-CM | POA: Diagnosis not present

## 2017-12-03 DIAGNOSIS — Z91040 Latex allergy status: Secondary | ICD-10-CM

## 2017-12-03 DIAGNOSIS — Z79899 Other long term (current) drug therapy: Secondary | ICD-10-CM

## 2017-12-03 DIAGNOSIS — E876 Hypokalemia: Secondary | ICD-10-CM | POA: Diagnosis present

## 2017-12-03 DIAGNOSIS — R42 Dizziness and giddiness: Secondary | ICD-10-CM | POA: Diagnosis present

## 2017-12-03 DIAGNOSIS — Z79811 Long term (current) use of aromatase inhibitors: Secondary | ICD-10-CM | POA: Diagnosis not present

## 2017-12-03 DIAGNOSIS — A419 Sepsis, unspecified organism: Secondary | ICD-10-CM

## 2017-12-03 DIAGNOSIS — Z803 Family history of malignant neoplasm of breast: Secondary | ICD-10-CM | POA: Diagnosis not present

## 2017-12-03 LAB — CBC
HEMATOCRIT: 39.7 % (ref 35.0–47.0)
HEMOGLOBIN: 13.9 g/dL (ref 12.0–16.0)
MCH: 28.4 pg (ref 26.0–34.0)
MCHC: 34.9 g/dL (ref 32.0–36.0)
MCV: 81.4 fL (ref 80.0–100.0)
Platelets: 265 10*3/uL (ref 150–440)
RBC: 4.88 MIL/uL (ref 3.80–5.20)
RDW: 15.3 % — ABNORMAL HIGH (ref 11.5–14.5)
WBC: 12.1 10*3/uL — ABNORMAL HIGH (ref 3.6–11.0)

## 2017-12-03 LAB — URINALYSIS, COMPLETE (UACMP) WITH MICROSCOPIC
Bacteria, UA: NONE SEEN
Bilirubin Urine: NEGATIVE
GLUCOSE, UA: NEGATIVE mg/dL
Hgb urine dipstick: NEGATIVE
Ketones, ur: NEGATIVE mg/dL
Leukocytes, UA: NEGATIVE
Nitrite: NEGATIVE
PH: 7 (ref 5.0–8.0)
Protein, ur: NEGATIVE mg/dL
SPECIFIC GRAVITY, URINE: 1.01 (ref 1.005–1.030)

## 2017-12-03 LAB — URINE DRUG SCREEN, QUALITATIVE (ARMC ONLY)
Amphetamines, Ur Screen: NOT DETECTED
Benzodiazepine, Ur Scrn: NOT DETECTED
COCAINE METABOLITE, UR ~~LOC~~: NOT DETECTED
Cannabinoid 50 Ng, Ur ~~LOC~~: POSITIVE — AB
MDMA (ECSTASY) UR SCREEN: NOT DETECTED
METHADONE SCREEN, URINE: NOT DETECTED
Opiate, Ur Screen: NOT DETECTED
Phencyclidine (PCP) Ur S: NOT DETECTED
TRICYCLIC, UR SCREEN: POSITIVE — AB

## 2017-12-03 LAB — COMPREHENSIVE METABOLIC PANEL
ALK PHOS: 86 U/L (ref 38–126)
ALT: 20 U/L (ref 0–44)
ANION GAP: 9 (ref 5–15)
AST: 23 U/L (ref 15–41)
Albumin: 4.1 g/dL (ref 3.5–5.0)
BUN: 12 mg/dL (ref 6–20)
CALCIUM: 9.6 mg/dL (ref 8.9–10.3)
CO2: 27 mmol/L (ref 22–32)
Chloride: 105 mmol/L (ref 98–111)
Creatinine, Ser: 0.97 mg/dL (ref 0.44–1.00)
GFR calc non Af Amer: >60 mL/min (ref >60)
GLUCOSE: 143 mg/dL — AB (ref 70–99)
Potassium: 4.1 mmol/L (ref 3.5–5.1)
Sodium: 141 mmol/L (ref 135–145)
Total Bilirubin: 0.6 mg/dL (ref 0.3–1.2)
Total Protein: 7.7 g/dL (ref 6.5–8.1)

## 2017-12-03 LAB — LACTIC ACID, PLASMA: Lactic Acid, Venous: 1.7 mmol/L (ref 0.5–1.9)

## 2017-12-03 LAB — TSH: TSH: 0.95 u[IU]/mL (ref 0.350–4.500)

## 2017-12-03 LAB — TROPONIN I

## 2017-12-03 MED ORDER — PROCHLORPERAZINE EDISYLATE 10 MG/2ML IJ SOLN
10.0000 mg | Freq: Once | INTRAMUSCULAR | Status: AC
Start: 2017-12-03 — End: 2017-12-03
  Administered 2017-12-03: 10 mg via INTRAVENOUS
  Filled 2017-12-03: qty 2

## 2017-12-03 MED ORDER — ENOXAPARIN SODIUM 40 MG/0.4ML ~~LOC~~ SOLN
40.0000 mg | SUBCUTANEOUS | Status: DC
  Administered 2017-12-03: 40 mg via SUBCUTANEOUS
  Filled 2017-12-03: qty 0.4

## 2017-12-03 MED ORDER — ONDANSETRON HCL 4 MG PO TABS
4.0000 mg | ORAL_TABLET | Freq: Four times a day (QID) | ORAL | Status: DC | PRN
  Filled 2017-12-03: qty 1

## 2017-12-03 MED ORDER — ONDANSETRON HCL 4 MG/2ML IJ SOLN: 4.0000 mg | Freq: Four times a day (QID) | INTRAMUSCULAR | Status: DC | PRN

## 2017-12-03 MED ORDER — ASPIRIN EC 81 MG PO TBEC
81.0000 mg | DELAYED_RELEASE_TABLET | Freq: Every day | ORAL | Status: DC
  Administered 2017-12-03 – 2017-12-04 (×2): 81 mg via ORAL
  Filled 2017-12-03 (×2): qty 1

## 2017-12-03 MED ORDER — ALBUTEROL SULFATE (2.5 MG/3ML) 0.083% IN NEBU: 2.5000 mg | INHALATION_SOLUTION | RESPIRATORY_TRACT | Status: DC | PRN

## 2017-12-03 MED ORDER — MECLIZINE HCL 25 MG PO TABS
25.0000 mg | ORAL_TABLET | Freq: Once | ORAL | Status: AC
Start: 2017-12-03 — End: 2017-12-03
  Administered 2017-12-03: 25 mg via ORAL
  Filled 2017-12-03: qty 1

## 2017-12-03 MED ORDER — ACETAMINOPHEN 325 MG PO TABS: 650.0000 mg | ORAL_TABLET | Freq: Four times a day (QID) | ORAL | Status: DC | PRN

## 2017-12-03 MED ORDER — VANCOMYCIN HCL IN DEXTROSE 1-5 GM/200ML-% IV SOLN
1000.0000 mg | Freq: Once | INTRAVENOUS | Status: AC
  Administered 2017-12-03: 1000 mg via INTRAVENOUS
  Filled 2017-12-03 (×2): qty 200

## 2017-12-03 MED ORDER — SODIUM CHLORIDE 0.9 % IV BOLUS
1000.0000 mL | Freq: Once | INTRAVENOUS | Status: AC
  Administered 2017-12-03: 1000 mL via INTRAVENOUS

## 2017-12-03 MED ORDER — POLYETHYLENE GLYCOL 3350 17 G PO PACK
17.0000 g | PACK | Freq: Every day | ORAL | Status: DC | PRN
  Filled 2017-12-03: qty 1

## 2017-12-03 MED ORDER — HYDRALAZINE HCL 20 MG/ML IJ SOLN
10.0000 mg | Freq: Four times a day (QID) | INTRAMUSCULAR | Status: DC | PRN
  Filled 2017-12-03: qty 0.5

## 2017-12-03 MED ORDER — IBUPROFEN 400 MG PO TABS: 400.0000 mg | ORAL_TABLET | Freq: Four times a day (QID) | ORAL | Status: DC | PRN

## 2017-12-03 MED ORDER — PIPERACILLIN-TAZOBACTAM 3.375 G IVPB 30 MIN
3.3750 g | Freq: Once | INTRAVENOUS | Status: AC
  Administered 2017-12-03: 3.375 g via INTRAVENOUS
  Filled 2017-12-03 (×2): qty 50

## 2017-12-03 MED ORDER — ACETAMINOPHEN 650 MG RE SUPP
650.0000 mg | Freq: Four times a day (QID) | RECTAL | Status: DC | PRN
  Filled 2017-12-03: qty 1

## 2017-12-03 MED ORDER — SODIUM CHLORIDE 0.9% FLUSH
3.0000 mL | Freq: Two times a day (BID) | INTRAVENOUS | Status: DC
  Administered 2017-12-03 – 2017-12-04 (×2): 3 mL via INTRAVENOUS

## 2017-12-03 MED ORDER — DIPHENHYDRAMINE HCL 50 MG/ML IJ SOLN
25.0000 mg | Freq: Once | INTRAMUSCULAR | Status: AC
  Administered 2017-12-03: 25 mg via INTRAVENOUS
  Filled 2017-12-03: qty 1

## 2017-12-03 NOTE — ED Notes (Signed)
Patient returned from radiology

## 2017-12-03 NOTE — ED Notes (Signed)
Patient transported to X-ray 

## 2017-12-03 NOTE — ED Notes (Signed)
EKG machine wasn't working in triage room. Pt taken to CT at this time. Will obtain EKG when pt returns.

## 2017-12-03 NOTE — Progress Notes (Signed)
CODE SEPSIS - PHARMACY COMMUNICATION  **Broad Spectrum Antibiotics should be administered within 1 hour of Sepsis diagnosis**  Time Code Sepsis Called/Page Received: 1814 1st code sepsis was cancelled due to negative initial work-up but subsequently called in light of further findings Antibiotics Ordered: vancomycin and Zosyn  Time of 1st antibiotic administration: 1824  Additional action taken by pharmacy: in contact with RN providing care  If necessary, Name of Provider/Nurse Contacted: Margaretmary Lombard ,PharmD Clinical Pharmacist  12/03/2017  6:37 PM

## 2017-12-03 NOTE — ED Notes (Signed)
bair hugger placed on pt at this time due to low temp

## 2017-12-03 NOTE — ED Triage Notes (Signed)
Pt states she fell on 7/4. States she hit posterior head. Denies LOC. Denies blood thinners. Alert, oriented, answering questions, speaking in clear complete sentences. States N&V, dizziness. States blurred vision, states both eyes. States "swimmy headed."

## 2017-12-03 NOTE — H&P (Signed)
[facility] [place] NAME: Mellie Buccellato    MR#:  [id number]  DATE OF BIRTH:  02-17-1961  DATE OF ADMISSION:  12/03/2017  PRIMARY CARE PHYSICIAN: [facility]   REQUESTING/REFERRING PHYSICIAN: Dr. Clearnce Hasten  CHIEF COMPLAINT:   Chief Complaint  Patient presents with  . Fall  . Head Injury    HISTORY OF PRESENT ILLNESS:  Shery Wauneka  is a 57 y.o. female with a known history of hypertension, breast cancer presents to the emergency room due to having profuse sweating, lightheaded and dizzy.  Patient here in the emergency room has been found to have rectal temperature of 93.6.  No clear etiology found.  Was given IV antibiotics and is being admitted to the hospitalist service.  She had a fall on July 4 where she hit back of the head.  Had CT scan of the head and was normal.  Today this was repeated and normal without any hemorrhage.  She has not had any recent change in medications.  Was sweating and felt cold to the daughter even out in the hot sun.  No history of thyroid problems.  PAST MEDICAL HISTORY:   Past Medical History:  Diagnosis Date  . Breast cancer ([place]) 2008   chemo mastectomy left  . Cancer ([place])    breast  . Hypertension     PAST SURGICAL HISTORY:   Past Surgical History:  Procedure Laterality Date  . ABDOMINAL HYSTERECTOMY    . MASTECTOMY Left 2008    SOCIAL HISTORY:   Social History   Tobacco Use  . Smoking status: Current Every Day Smoker    Packs/day: 0.50    Types: Cigarettes  . Smokeless tobacco: Never Used  Substance Use Topics  . Alcohol use: Yes    Comment: occassional     FAMILY HISTORY:   Family History  Problem Relation Age of Onset  . Breast cancer Maternal Aunt   . Heart disease Father   . Hypertension Father   . Cancer Sister     DRUG ALLERGIES:   Allergies  Allergen Reactions  . Pollen Extract   . Latex Itching and Rash    REVIEW OF SYSTEMS:   Review of  Systems  Constitutional: Positive for chills and malaise/fatigue. Negative for fever.  HENT: Negative for sore throat.   Eyes: Negative for blurred vision, double vision and pain.  Respiratory: Negative for cough, hemoptysis, shortness of breath and wheezing.   Cardiovascular: Negative for chest pain, palpitations, orthopnea and leg swelling.  Gastrointestinal: Negative for abdominal pain, constipation, diarrhea, heartburn, nausea and vomiting.  Genitourinary: Negative for dysuria and hematuria.  Musculoskeletal: Negative for back pain and joint pain.  Skin: Negative for rash.  Neurological: Positive for dizziness and weakness. Negative for sensory change, speech change, focal weakness and headaches.  Endo/Heme/Allergies: Does not bruise/bleed easily.  Psychiatric/Behavioral: Negative for depression. The patient is not nervous/anxious.     MEDICATIONS AT HOME:   Prior to Admission medications   Medication Sig Start Date End Date Taking? Authorizing Provider  amitriptyline (ELAVIL) 25 MG tablet  05/31/09   [provider]  amLODipine (NORVASC) 10 MG tablet  10/27/17   [provider]  aspirin EC 81 MG tablet Take by mouth.    [provider]  cetirizine (ZYRTEC) 10 MG tablet Take by mouth.    [provider]  cloNIDine (CATAPRES) 0.2 MG tablet  10/27/17   [provider]  exemestane (AROMASIN) 25  MG tablet Take by mouth.    [provider]  losartan-hydrochlorothiazide (HYZAAR) 100-12.5 MG tablet Take by mouth.    [provider]  lovastatin (MEVACOR) 40 MG tablet Take by mouth.    [provider]  metoprolol succinate (TOPROL-XL) 100 MG 24 hr tablet  10/27/17   [provider]  ofloxacin (FLOXIN) 0.3 % otic solution Place 5 drops into the left ear 2 (two) times daily. For 7 days 09/09/15   Harvest Dark, MD     VITAL SIGNS:  Blood pressure 103/70, pulse 74, temperature (!) 93.6 F (34.2 C), temperature  source Rectal, resp. rate 17, height 5' (1.524 m), weight 55.3 kg (122 lb), SpO2 97 %.  PHYSICAL EXAMINATION:  Physical Exam  GENERAL:  57 y.o.-year-old patient lying in the bed , restless EYES: Pupils equal, round, reactive to light and accommodation. No scleral icterus. Extraocular muscles intact.  HEENT: Head atraumatic, normocephalic. Oropharynx and nasopharynx clear. No oropharyngeal erythema, moist oral mucosa  NECK:  Supple, no jugular venous distention. No thyroid enlargement, no tenderness.  LUNGS: Normal breath sounds bilaterally, no wheezing, rales, rhonchi. No use of accessory muscles of respiration.  CARDIOVASCULAR: S1, S2 normal. No murmurs, rubs, or gallops.  ABDOMEN: Soft, nontender, nondistended. Bowel sounds present. No organomegaly or mass.  EXTREMITIES: No pedal edema, cyanosis, or clubbing. + 2 pedal & radial pulses b/l.   NEUROLOGIC: Cranial nerves II through XII are intact. No focal Motor or sensory deficits appreciated b/l PSYCHIATRIC: The patient is alert and oriented x 3.  Anxious SKIN: No obvious rash, lesion, or ulcer.   LABORATORY PANEL:   CBC Recent Labs  Lab 12/03/17 1512  WBC 12.1*  HGB 13.9  HCT 39.7  PLT 265   ------------------------------------------------------------------------------------------------------------------  Chemistries  Recent Labs  Lab 12/03/17 1512  NA 141  K 4.1  CL 105  CO2 27  GLUCOSE 143*  BUN 12  CREATININE 0.97  CALCIUM 9.6  AST 23  ALT 20  ALKPHOS 86  BILITOT 0.6   ------------------------------------------------------------------------------------------------------------------  Cardiac Enzymes Recent Labs  Lab 12/03/17 1512  TROPONINI <0.03   ------------------------------------------------------------------------------------------------------------------  RADIOLOGY:  Dg Chest 2 View  Result Date: 12/03/2017 CLINICAL DATA:  Nausea, vomiting and dizziness. History of breast cancer, hypertension and  smoking. EXAM: CHEST - 2 VIEW COMPARISON:  06/11/2010 FINDINGS: The heart size and mediastinal contours are within normal limits. Increased interstitial lung markings with mild peribronchial thickening consistent with smoking related bronchitic change. No alveolar consolidation or CHF. The visualized skeletal structures are unremarkable. IMPRESSION: Smoking related bronchitic change of the lungs. Electronically Signed   By: Ashley Royalty M.D.   On: 12/03/2017 17:37   Ct Head Wo Contrast  Result Date: 12/03/2017 CLINICAL DATA:  [place] on 11/26/2017, striking the back of the head. Patient denies loss of consciousness. Current symptoms include nausea and vomiting, dizziness and blurred vision. Initial encounter. Personal history of LEFT breast cancer post mastectomy in 2008. EXAM: CT HEAD WITHOUT CONTRAST TECHNIQUE: Contiguous axial images were obtained from the base of the skull through the vertex without intravenous contrast. COMPARISON:  08/05/2007 FINDINGS: Brain: Ventricular system normal in size and appearance for age. No mass lesion. No midline shift. No acute hemorrhage or hematoma. No extra-axial fluid collections. No evidence of acute infarction. No focal brain parenchymal abnormalities. Low-lying cerebellar tonsils as noted previously. Vascular: Minimal LEFT carotid siphon atherosclerosis. No hyperdense vessel. Skull: No skull fracture or other focal osseous abnormality involving the skull. Sinuses/Orbits: Visualized paranasal sinuses, bilateral mastoid air  cells and bilateral middle ear cavities well-aerated. Frontal sinuses and LEFT sphenoid sinus hypoplastic. Visualized orbits and globes normal in appearance. Other: None. IMPRESSION: No acute intracranial abnormality. Electronically Signed   By: Evangeline Dakin M.D.   On: 12/03/2017 15:37     IMPRESSION AND PLAN:   *Hypothermia.  Etiology is unclear.  Patient has normal blood pressure, heart rate.  Hemoglobin stable.  BUN and creatinine and  electrolytes normal.  Will check TSH and cortisol level.  CT scan of the head shows no acute hemorrhage.  Patient has received 1 dose of IV vancomycin and Zosyn.  Will not continue antibiotics at this time.  Cultures pending.  No signs of infection. Bair hugger placed. Telemetry monitoring  *Hypertension.  Hold medications at this time.  Ordered IV hydralazine as needed if blood pressure becomes elevated.  *DVT prophylaxis with Lovenox  All the records are reviewed and case discussed with ED provider. Management plans discussed with the patient, family and they are in agreement.  CODE STATUS: Full code  TOTAL CC TIME TAKING CARE OF THIS PATIENT: 40 minutes.   Neita Carp M.D on 12/03/2017 at 6:47 PM  Between 7am to 6pm - Pager - [phone]  After 6pm go to [email or web address] - password [email or web address] [facility]  Office  [phone]  CC: Primary care physician; [facility]  Note: This dictation was prepared with Diplomatic Services operational officer dictation along with smaller phrase technology. Any transcriptional errors that result from this process are unintentional.

## 2017-12-03 NOTE — ED Provider Notes (Signed)
[facility] Emergency Department Provider Note ____________________________________________   First MD Initiated Contact with Patient 12/03/17 1535     (approximate)  I have reviewed the triage vital signs and the nursing notes.   HISTORY  Chief Complaint Fall and Head Injury   HPI Tonya Keith is a 57 y.o. female with a history of hypertension as well as breast cancer who is presenting to the emergency department today with lightheadedness, dizziness and nausea ever since falling on July 4.  She says that she went to sit in the chair and missed and fell backward, hitting the back of her head.  However, she denies any pain.  She says that the symptoms have been worsening.  She says that when she opens her eyes the dizziness becomes worse.  Says that she has been diaphoretic today.  Denies any abdominal pain.  Says that she is also been very nauseous today as well.  History of migraine headaches but denies any head pain or neck pain at this time.  Past Medical History:  Diagnosis Date  . Breast cancer ([place]) 2008   chemo mastectomy left  . Cancer [facility])    breast  . Hypertension     Patient Active Problem List   Diagnosis Date Noted  . Bilateral lower extremity pain 11/17/2017  . Bilateral lower extremity edema 11/17/2017  . Tobacco abuse 11/17/2017  . Essential hypertension 11/17/2017  . Hyperlipidemia 11/17/2017    Past Surgical History:  Procedure Laterality Date  . ABDOMINAL HYSTERECTOMY    . MASTECTOMY Left 2008    Prior to Admission medications   Medication Sig Start Date End Date Taking? Authorizing Provider  amitriptyline (ELAVIL) 25 MG tablet  05/31/09   [provider]  amLODipine (NORVASC) 10 MG tablet  10/27/17   [provider]  aspirin EC 81 MG tablet Take by mouth.    [provider]  cetirizine (ZYRTEC) 10 MG tablet Take by mouth.    [provider]  cloNIDine (CATAPRES) 0.2 MG tablet  10/27/17    [provider]  exemestane (AROMASIN) 25 MG tablet Take by mouth.    [provider]  losartan-hydrochlorothiazide (HYZAAR) 100-12.5 MG tablet Take by mouth.    [provider]  lovastatin (MEVACOR) 40 MG tablet Take by mouth.    [provider]  metoprolol succinate (TOPROL-XL) 100 MG 24 hr tablet  10/27/17   [provider]  ofloxacin (FLOXIN) 0.3 % otic solution Place 5 drops into the left ear 2 (two) times daily. For 7 days 09/09/15   Harvest Dark, MD    Allergies Pollen extract and Latex  Family History  Problem Relation Age of Onset  . Breast cancer Maternal Aunt   . Heart disease Father   . Hypertension Father   . Cancer Sister     Social History Social History   Tobacco Use  . Smoking status: Current Every Day Smoker    Packs/day: 0.50    Types: Cigarettes  . Smokeless tobacco: Never Used  Substance Use Topics  . Alcohol use: Yes    Comment: occassional   . Drug use: Yes    Types: Marijuana    Comment: "every once in a while"    Review of Systems  Constitutional: No fever/chills Eyes: No visual changes. ENT: No sore throat. Cardiovascular: Denies chest pain. Respiratory: Denies shortness of breath. Gastrointestinal: No abdominal pain.  no vomiting.  No diarrhea.  No constipation. Genitourinary: Negative for dysuria. Musculoskeletal: Negative  for back pain. Skin: Negative for rash. Neurological: Negative for headaches, focal weakness or numbness.   ____________________________________________   PHYSICAL EXAM:  VITAL SIGNS: ED Triage Vitals  Enc Vitals Group     BP 12/03/17 1512 119/73     Pulse Rate 12/03/17 1512 (!) 55     Resp 12/03/17 1512 18     Temp 12/03/17 1636 (!) 93.7 F (34.3 C)     Temp Source 12/03/17 1636 Rectal     SpO2 12/03/17 1512 100 %     Weight 12/03/17 1512 122 lb (55.3 kg)     Height 12/03/17 1512 5' (1.524 m)     Head Circumference --      Peak Flow --      Pain Score  12/03/17 1512 10     Pain Loc --      Pain Edu? --      Excl. in [place]? --     Constitutional: Alert and oriented.  Patient appears uncomfortable.  In the fetal position on the bed.  Tearing and diaphoretic. Eyes: Conjunctivae are normal.  No nystagmus.  Pupils are equal, round and reactive to light. Head: Atraumatic. Nose: No congestion/rhinnorhea. Mouth/Throat: Mucous membranes are moist.  Neck: No stridor.  No tenderness to palpation of midline cervical spine.  No deformity or step-off. Cardiovascular: Normal rate, regular rhythm. Grossly normal heart sounds.   Respiratory: Normal respiratory effort.  No retractions. Lungs CTAB. Gastrointestinal: Soft and nontender. No distention. No CVA tenderness. Musculoskeletal: No lower extremity tenderness nor edema.  No joint effusions. Neurologic:  Normal speech and language. No gross focal neurologic deficits are appreciated. Skin:  Skin is warm, dry and intact. No rash noted. Psychiatric: Mood and affect are normal. Speech and behavior are normal.  ____________________________________________   LABS (all labs ordered are listed, but only abnormal results are displayed)  Labs Reviewed  CBC - Abnormal; Notable for the following components:      Result Value   WBC 12.1 (*)    RDW 15.3 (*)    All other components within normal limits  COMPREHENSIVE METABOLIC PANEL - Abnormal; Notable for the following components:   Glucose, Bld 143 (*)    All other components within normal limits  URINALYSIS, COMPLETE (UACMP) WITH MICROSCOPIC - Abnormal; Notable for the following components:   Color, Urine YELLOW (*)    APPearance CLEAR (*)    All other components within normal limits  CULTURE, BLOOD (ROUTINE X 2)  CULTURE, BLOOD (ROUTINE X 2)  URINE CULTURE  TROPONIN I  LACTIC ACID, PLASMA  LACTIC ACID, PLASMA  TSH  CORTISOL   ____________________________________________  EKG  ED ECG REPORT I, Doran Stabler, the attending physician,  personally viewed and interpreted this ECG.   Date: 12/03/2017  EKG Time: 1530  Rate: 62  Rhythm: normal sinus rhythm  Axis: Normal  Intervals:none  ST&T Change: No ST segment elevation or depression.  No abnormal T wave inversion.  ____________________________________________  RADIOLOGY  CT head without any acute process.  No acute process on the chest x-ray. ____________________________________________   PROCEDURES  Procedure(s) performed:   .Critical Care Performed by: Orbie Pyo, MD Authorized by: Orbie Pyo, MD   Critical care provider statement:    Critical care time (minutes):  35   Critical care time was exclusive of:  Separately billable procedures and treating other patients   Critical care was necessary to treat or prevent imminent or life-threatening deterioration of the following conditions:  Sepsis  Critical care was time spent personally by me on the following activities:  Development of treatment plan with patient or surrogate, discussions with consultants, evaluation of patient's response to treatment, examination of patient, obtaining history from patient or surrogate, ordering and performing treatments and interventions, ordering and review of laboratory studies, ordering and review of radiographic studies, pulse oximetry, re-evaluation of patient's condition and review of old charts    Critical Care performed:   ____________________________________________   INITIAL IMPRESSION / [place] / ED COURSE  Pertinent labs & imaging results that were available during my care of the patient were reviewed by me and considered in my medical decision making (see chart for details).  DDX: Sepsis, UTI, pneumonia, bacteremia, concussion, migraine, hypothermia, hypothyroidism As part of my medical decision making, I reviewed the following data within the electronic MEDICAL RECORD NUMBER Notes from prior ED  visits  ----------------------------------------- 6:23 PM on 12/03/2017 -----------------------------------------  Despite being placed on the bear hugger the patient is still with a low core temperature of 93.  TSH pending.  Possible sepsis of unknown source at this time.  Reassuring chest x-ray as well as urinalysis.  Patient be admitted to the hospital and placed on broad-spectrum antibiotics.  Initial complaint was for lightheadedness.  No headache no neck pain.  Patient ranging head neck freely without meningismus.  Signed out to Dr. Darvin Neighbours.  Patient aware of need for admission the hospital and willing to comply. ____________________________________________   FINAL CLINICAL IMPRESSION(S) / ED DIAGNOSES  Final diagnoses:  Sepsis, due to unspecified organism ([place])  Vertigo.    NEW MEDICATIONS STARTED DURING THIS VISIT:  New Prescriptions   No medications on file     Note:  This document was prepared using Dragon voice recognition software and may include unintentional dictation errors.     Orbie Pyo, MD 12/03/17 [phone]

## 2017-12-03 NOTE — ED Notes (Signed)
Admission MD at bedside.  

## 2017-12-03 NOTE — ED Notes (Signed)
Patient placed under bear hugger.  Patient tolerating this well at this time.

## 2017-12-04 DIAGNOSIS — T68XXXA Hypothermia, initial encounter: Secondary | ICD-10-CM | POA: Diagnosis not present

## 2017-12-04 DIAGNOSIS — R42 Dizziness and giddiness: Secondary | ICD-10-CM | POA: Diagnosis not present

## 2017-12-04 LAB — COMPREHENSIVE METABOLIC PANEL
ALK PHOS: 74 U/L (ref 38–126)
ALT: 16 U/L (ref 0–44)
ANION GAP: 9 (ref 5–15)
AST: 18 U/L (ref 15–41)
Albumin: 3.7 g/dL (ref 3.5–5.0)
BUN: 10 mg/dL (ref 6–20)
CALCIUM: 8.7 mg/dL — AB (ref 8.9–10.3)
CO2: 25 mmol/L (ref 22–32)
Chloride: 106 mmol/L (ref 98–111)
Creatinine, Ser: 0.75 mg/dL (ref 0.44–1.00)
GFR calc non Af Amer: >60 mL/min (ref >60)
Glucose, Bld: 100 mg/dL — ABNORMAL HIGH (ref 70–99)
Potassium: 3.2 mmol/L — ABNORMAL LOW (ref 3.5–5.1)
SODIUM: 140 mmol/L (ref 135–145)
Total Bilirubin: 0.6 mg/dL (ref 0.3–1.2)
Total Protein: 7 g/dL (ref 6.5–8.1)

## 2017-12-04 LAB — CBC
HCT: 34.9 % — ABNORMAL LOW (ref 35.0–47.0)
Hemoglobin: 12.2 g/dL (ref 12.0–16.0)
MCH: 28.6 pg (ref 26.0–34.0)
MCHC: 34.9 g/dL (ref 32.0–36.0)
MCV: 82 fL (ref 80.0–100.0)
PLATELETS: 224 10*3/uL (ref 150–440)
RBC: 4.25 MIL/uL (ref 3.80–5.20)
RDW: 15.7 % — AB (ref 11.5–14.5)
WBC: 11.3 10*3/uL — ABNORMAL HIGH (ref 3.6–11.0)

## 2017-12-04 LAB — CORTISOL: CORTISOL PLASMA: 5.7 ug/dL

## 2017-12-04 MED ORDER — POTASSIUM CHLORIDE CRYS ER 20 MEQ PO TBCR
30.0000 meq | EXTENDED_RELEASE_TABLET | Freq: Once | ORAL | Status: AC
  Administered 2017-12-04: 30 meq via ORAL
  Filled 2017-12-04: qty 2

## 2017-12-04 MED ORDER — NICOTINE 14 MG/24HR TD PT24
14.0000 mg | MEDICATED_PATCH | Freq: Every day | TRANSDERMAL | Status: DC
  Administered 2017-12-04: 11:00:00 14 mg via TRANSDERMAL
  Filled 2017-12-04: qty 1

## 2017-12-04 NOTE — Progress Notes (Signed)
,   Dizziness, sweating symptoms improved,  patient also had hypothermia, hypokalemia all that is improved, infection workup has been negative. Discharged home today instructions on the computer.

## 2017-12-04 NOTE — Plan of Care (Signed)
Pt's temperature has been WNL - no nausea/vomiting.  Gave pt 30 mEq one time of K+Cl for 3.2.  Also gave her a nicotine patch.  Pt d/ced home.  SWOT nurse reviewed d/c instructions and f/u appts. Also removed IV and telemetry.   No new medications given.  Pt is getting a ride home with family.

## 2017-12-05 LAB — HIV ANTIBODY (ROUTINE TESTING W REFLEX): HIV SCREEN 4TH GENERATION: NONREACTIVE

## 2017-12-05 NOTE — Discharge Summary (Signed)
Tonya Keith, is a 57 y.o. female  DOB [birth year]  MRN [id number].  Admission date:  12/03/2017  Admitting Physician  Hillary Bow, MD  Discharge Date:  12/04/2017   Primary MD  [facility] [place]  Recommendations for primary care physician for things to follow:   Follow-up with PCP in one week  Admission Diagnosis  Vertigo [R42] Sepsis, due to unspecified organism [facility]) [A41.9]   Discharge Diagnosis  Vertigo [R42] Sepsis, due to unspecified organism [facility]) [A41.9]   Active Problems:   Hypothermia      Past Medical History:  Diagnosis Date  . Breast cancer ([place]) 2008   chemo mastectomy left  . Cancer [facility])    breast  . Hypertension     Past Surgical History:  Procedure Laterality Date  . ABDOMINAL HYSTERECTOMY    . MASTECTOMY Left 2008       History of present illness and  Hospital Course:     Kindly see H&P for history of present illness and admission details, please review complete Labs, Consult reports and Test reports for all details in brief  HPI  from the history and physical done on the day of admission 57 year old female patient admitted for dizziness, profuse sweating, found to have a rectal temperature of 93.6 Fahrenheit on admission. Admitted for hypothermia, received IV antibiotics in the emergency room.Marland Kitchen  Hospital Course  1. Hypothermia; transient, sepsis  workup has been negative including UA, chest x-ray. Received IV fluids. Patient had a fall on July 4 at home. CT head on admission is negative. Blood cultures have been negative. No sign of infection. Hypothermia has been improved with Bair hgger, temperature improved to 98 Fahrenheit. Patient hemodynamically stable and discharged home she wanted to go home and denied any complaints.  Urine Culture showed only 10,000  colonies of gram-negative rods. Blood cultures have been negative.received a dose of vancomycin and zosyn in ER,did not get any abx after.monitored on  Telemetry monitor,  She felt much better and wanted to go home.  2. Hypokalemia, replaced, discontinue diuretics at discharge.   3. essential hypertension: controlled , continue clonidine, Norvasc, metoprolol.held BP meds at admission.  Discharge Condition:    Follow UP  [place]. Schedule an appointment as soon as possible for a visit in 1 week(s).   Specialty:  General Practice Contact information: [place] [address] [phone]             Discharge Instructions  and  Discharge Medications      Allergies as of 12/04/2017      Reactions   Pollen Extract    Latex Itching, Rash      Medication List    STOP taking these medications   losartan-hydrochlorothiazide 100-12.5 MG tablet Commonly known as:  HYZAAR     TAKE these medications   amitriptyline 25 MG tablet Commonly known as:  ELAVIL Take 25 mg by mouth at bedtime.   amLODipine 10 MG tablet Commonly known as:  NORVASC Take 10 mg by mouth daily.   aspirin EC 81 MG tablet Take 81 mg by mouth daily.   cetirizine 10 MG tablet Commonly known as:  ZYRTEC Take 10 mg by mouth daily.   cloNIDine 0.2 MG tablet Commonly known as:  CATAPRES Take 0.2 mg by mouth daily.   lovastatin 40 MG tablet Commonly known as:  MEVACOR Take 40 mg by mouth daily.   metoprolol  succinate 100 MG 24 hr tablet Commonly known as:  TOPROL-XL Take 100 mg by mouth daily.         Diet and Activity recommendation: See Discharge Instructions above   Consults obtained - none   Major procedures and Radiology Reports - PLEASE review detailed and final reports for all details, in brief -      Dg Chest 2 View  Result Date: 12/03/2017 CLINICAL DATA:  Nausea, vomiting and dizziness.  History of breast cancer, hypertension and smoking. EXAM: CHEST - 2 VIEW COMPARISON:  06/11/2010 FINDINGS: The heart size and mediastinal contours are within normal limits. Increased interstitial lung markings with mild peribronchial thickening consistent with smoking related bronchitic change. No alveolar consolidation or CHF. The visualized skeletal structures are unremarkable. IMPRESSION: Smoking related bronchitic change of the lungs. Electronically Signed   By: Ashley Royalty M.D.   On: 12/03/2017 17:37   Ct Head Wo Contrast  Result Date: 12/03/2017 CLINICAL DATA:  [place] on 11/26/2017, striking the back of the head. Patient denies loss of consciousness. Current symptoms include nausea and vomiting, dizziness and blurred vision. Initial encounter. Personal history of LEFT breast cancer post mastectomy in 2008. EXAM: CT HEAD WITHOUT CONTRAST TECHNIQUE: Contiguous axial images were obtained from the base of the skull through the vertex without intravenous contrast. COMPARISON:  08/05/2007 FINDINGS: Brain: Ventricular system normal in size and appearance for age. No mass lesion. No midline shift. No acute hemorrhage or hematoma. No extra-axial fluid collections. No evidence of acute infarction. No focal brain parenchymal abnormalities. Low-lying cerebellar tonsils as noted previously. Vascular: Minimal LEFT carotid siphon atherosclerosis. No hyperdense vessel. Skull: No skull fracture or other focal osseous abnormality involving the skull. Sinuses/Orbits: Visualized paranasal sinuses, bilateral mastoid air cells and bilateral middle ear cavities well-aerated. Frontal sinuses and LEFT sphenoid sinus hypoplastic. Visualized orbits and globes normal in appearance. Other: None. IMPRESSION: No acute intracranial abnormality. Electronically Signed   By: Evangeline Dakin M.D.   On: 12/03/2017 15:37    Micro Results     Recent Results (from the past 240 hour(s))  Blood Culture (routine x 2)     Status: None  (Preliminary result)   Collection Time: 12/03/17  4:50 PM  Result Value Ref Range Status   Specimen Description BLOOD BLOOD RIGHT WRIST  Final   Special Requests   Final    BOTTLES DRAWN AEROBIC ONLY Blood Culture adequate volume   Culture   Final    NO GROWTH 2 DAYS Performed at [facility], [address] [place], [place] [address]    Report Status PENDING  Incomplete  Urine culture     Status: Abnormal (Preliminary result)   Collection Time: 12/03/17  4:54 PM  Result Value Ref Range Status   Specimen Description   Final    URINE, CLEAN CATCH Performed at [facility], [address], [place], [place] [address]    Special Requests   Final    NONE Performed at [facility], [place], [place] [address]    Culture 10,000 COLONIES/mL GRAM NEGATIVE RODS (A)  Final   Report Status PENDING  Incomplete  Blood Culture (routine x 2)     Status: None (Preliminary result)   Collection Time: 12/03/17  4:58 PM  Result Value Ref Range Status   Specimen Description BLOOD RIGHT ANTECUBITAL  Final   Special Requests   Final    BOTTLES DRAWN AEROBIC AND ANAEROBIC Blood Culture results may not be optimal due to  an excessive volume of blood received in culture bottles   Culture   Final    NO GROWTH 2 DAYS Performed at [facility], [address] [place], [place] [address]    Report Status PENDING  Incomplete       Today   Subjective:   Tonya Keith today has no headache,no chest abdominal pain,no new weakness tingling or numbness, feels much better wants to go home today.  Objective:   Blood pressure 135/82, pulse 71, temperature 98 F (36.7 C), temperature source Oral, resp. rate 20, height 5' (1.524 m), weight 56.5 kg (124 lb 8 oz), SpO2 100 %.  No intake or output data in the 24 hours ending 12/05/17 1322  Exam Awake Alert, Oriented x 3, No new F.N deficits, Normal affect [place].AT,PERRAL Supple Neck,No JVD, No cervical  lymphadenopathy appriciated.  Symmetrical Chest wall movement, Good air movement bilaterally, CTAB RRR,No Gallops,Rubs or new Murmurs, No Parasternal Heave +ve B.Sounds, Abd Soft, Non tender, No organomegaly appriciated, No rebound -guarding or rigidity. No Cyanosis, Clubbing or edema, No new Rash or bruise  Data Review   CBC w Diff:  Lab Results  Component Value Date   WBC 11.3 (H) 12/04/2017   HGB 12.2 12/04/2017   HCT 34.9 (L) 12/04/2017   PLT 224 12/04/2017    CMP:  Lab Results  Component Value Date   NA 140 12/04/2017   K 3.2 (L) 12/04/2017   CL 106 12/04/2017   CO2 25 12/04/2017   BUN 10 12/04/2017   CREATININE 0.75 12/04/2017   PROT 7.0 12/04/2017   ALBUMIN 3.7 12/04/2017   BILITOT 0.6 12/04/2017   ALKPHOS 74 12/04/2017   AST 18 12/04/2017   ALT 16 12/04/2017  .   Total Time in preparing paper work, data evaluation and todays exam - 42 minutes  Epifanio Lesches M.D on 12/04/2017 at 1:22 PM    Note: This dictation was prepared with Dragon dictation along with smaller phrase technology. Any transcriptional errors that result from this process are unintentional.

## 2017-12-06 LAB — URINE CULTURE: Culture: 10000 — AB

## 2017-12-07 ENCOUNTER — Telehealth: Payer: Self-pay

## 2017-12-07 NOTE — Telephone Encounter (Signed)
Flagged on EMMI report for not having a follow up scheduled.  First attempt to reach patient made 12/07/17 at 4:10pm, however unable to reach patient.  Left voicemail encouraging callback. Will attempt at later time.

## 2017-12-08 LAB — CULTURE, BLOOD (ROUTINE X 2)
Culture: NO GROWTH
Culture: NO GROWTH
Special Requests: ADEQUATE

## 2017-12-08 NOTE — Telephone Encounter (Signed)
Reached upon 2nd attempt 12/08/17 at 1:52pm.  Patient mentioned she scheduled her follow up appointment with Princella Ion yesterday.  She was unable to do so Friday afternoon due to office closing at [place] she is doing well and has no questions or concerns.  I thanked her for her time and informed her that she would receive one more automated call checking on her in the next few days.

## 2018-02-19 ENCOUNTER — Other Ambulatory Visit: Payer: Self-pay | Admitting: Family Medicine

## 2018-02-19 DIAGNOSIS — Z1231 Encounter for screening mammogram for malignant neoplasm of breast: Secondary | ICD-10-CM

## 2018-02-19 DIAGNOSIS — M810 Age-related osteoporosis without current pathological fracture: Secondary | ICD-10-CM

## 2018-03-18 ENCOUNTER — Other Ambulatory Visit: Payer: Medicare HMO

## 2018-06-17 ENCOUNTER — Ambulatory Visit
Admission: RE | Admit: 2018-06-17 | Discharge: 2018-06-17 | Disposition: A | Discharge status: Home / Self Care | Payer: Medicare Other | Source: Ambulatory Visit | Attending: Family Medicine | Admitting: Family Medicine

## 2018-06-17 DIAGNOSIS — Z1231 Encounter for screening mammogram for malignant neoplasm of breast: Secondary | ICD-10-CM | POA: Diagnosis present

## 2018-06-17 DIAGNOSIS — M810 Age-related osteoporosis without current pathological fracture: Secondary | ICD-10-CM | POA: Insufficient documentation

## 2018-06-17 HISTORY — DX: Personal history of antineoplastic chemotherapy: Z92.21

## 2018-12-24 ENCOUNTER — Other Ambulatory Visit: Payer: Self-pay | Admitting: Specialist

## 2018-12-24 DIAGNOSIS — J439 Emphysema, unspecified: Secondary | ICD-10-CM

## 2018-12-24 DIAGNOSIS — R05 Cough: Secondary | ICD-10-CM

## 2018-12-24 DIAGNOSIS — R0602 Shortness of breath: Secondary | ICD-10-CM

## 2018-12-24 DIAGNOSIS — R053 Chronic cough: Secondary | ICD-10-CM

## 2018-12-31 ENCOUNTER — Other Ambulatory Visit: Payer: Self-pay

## 2018-12-31 ENCOUNTER — Ambulatory Visit
Admission: RE | Admit: 2018-12-31 | Discharge: 2018-12-31 | Disposition: A | Discharge status: Home / Self Care | Payer: Medicare Other | Source: Ambulatory Visit | Attending: Specialist | Admitting: Specialist

## 2018-12-31 DIAGNOSIS — J439 Emphysema, unspecified: Secondary | ICD-10-CM | POA: Diagnosis present

## 2018-12-31 DIAGNOSIS — R0602 Shortness of breath: Secondary | ICD-10-CM | POA: Insufficient documentation

## 2018-12-31 DIAGNOSIS — R053 Chronic cough: Secondary | ICD-10-CM

## 2018-12-31 DIAGNOSIS — R05 Cough: Secondary | ICD-10-CM | POA: Diagnosis present

## 2019-01-04 ENCOUNTER — Other Ambulatory Visit: Payer: Self-pay

## 2019-01-04 ENCOUNTER — Emergency Department: Payer: Medicare Other

## 2019-01-04 ENCOUNTER — Emergency Department
Admission: EM | Admit: 2019-01-04 | Discharge: 2019-01-04 | Disposition: A | Discharge status: Home / Self Care | Payer: Medicare Other | Attending: Emergency Medicine | Admitting: Emergency Medicine

## 2019-01-04 ENCOUNTER — Encounter: Payer: Self-pay | Admitting: Emergency Medicine

## 2019-01-04 DIAGNOSIS — F17210 Nicotine dependence, cigarettes, uncomplicated: Secondary | ICD-10-CM | POA: Insufficient documentation

## 2019-01-04 DIAGNOSIS — R1032 Left lower quadrant pain: Secondary | ICD-10-CM | POA: Insufficient documentation

## 2019-01-04 DIAGNOSIS — I10 Essential (primary) hypertension: Secondary | ICD-10-CM | POA: Insufficient documentation

## 2019-01-04 DIAGNOSIS — R109 Unspecified abdominal pain: Secondary | ICD-10-CM

## 2019-01-04 DIAGNOSIS — Z79899 Other long term (current) drug therapy: Secondary | ICD-10-CM | POA: Insufficient documentation

## 2019-01-04 DIAGNOSIS — Z7982 Long term (current) use of aspirin: Secondary | ICD-10-CM | POA: Diagnosis not present

## 2019-01-04 LAB — CBC
HCT: 35.8 % — ABNORMAL LOW (ref 36.0–46.0)
Hemoglobin: 12.8 g/dL (ref 12.0–15.0)
MCH: 28.3 pg (ref 26.0–34.0)
MCHC: 35.8 g/dL (ref 30.0–36.0)
MCV: 79.2 fL — ABNORMAL LOW (ref 80.0–100.0)
Platelets: 244 10*3/uL (ref 150–400)
RBC: 4.52 MIL/uL (ref 3.87–5.11)
RDW: 15.3 % (ref 11.5–15.5)
WBC: 11.2 10*3/uL — ABNORMAL HIGH (ref 4.0–10.5)
nRBC: 0 % (ref 0.0–0.2)

## 2019-01-04 LAB — BASIC METABOLIC PANEL
Anion gap: 7 (ref 5–15)
BUN: 14 mg/dL (ref 6–20)
CO2: 25 mmol/L (ref 22–32)
Calcium: 9 mg/dL (ref 8.9–10.3)
Chloride: 105 mmol/L (ref 98–111)
Creatinine, Ser: 1.12 mg/dL — ABNORMAL HIGH (ref 0.44–1.00)
GFR calc Af Amer: >60 mL/min (ref >60)
GFR calc non Af Amer: 53 mL/min — ABNORMAL LOW (ref >60)
Glucose, Bld: 130 mg/dL — ABNORMAL HIGH (ref 70–99)
Potassium: 4.2 mmol/L (ref 3.5–5.1)
Sodium: 137 mmol/L (ref 135–145)

## 2019-01-04 LAB — URINALYSIS, COMPLETE (UACMP) WITH MICROSCOPIC
Bacteria, UA: NONE SEEN
Bilirubin Urine: NEGATIVE
Glucose, UA: NEGATIVE mg/dL
Hgb urine dipstick: NEGATIVE
Ketones, ur: NEGATIVE mg/dL
Nitrite: NEGATIVE
Protein, ur: 30 mg/dL — AB
Specific Gravity, Urine: 1.025 (ref 1.005–1.030)
pH: 5 (ref 5.0–8.0)

## 2019-01-04 MED ORDER — CYCLOBENZAPRINE HCL 10 MG PO TABS: 10.0000 mg | ORAL_TABLET | Freq: Three times a day (TID) | ORAL | 0 refills | Status: DC | PRN

## 2019-01-04 MED ORDER — CEPHALEXIN 500 MG PO CAPS: 500.0000 mg | ORAL_CAPSULE | Freq: Two times a day (BID) | ORAL | 0 refills | Status: AC

## 2019-01-04 NOTE — ED Notes (Signed)
Patient transported to CT 

## 2019-01-04 NOTE — ED Triage Notes (Signed)
Patient reports left-sided flank pain x2 weeks. Denies urinary symptoms. Denies N/V/D.

## 2019-01-04 NOTE — ED Provider Notes (Signed)
[facility] Emergency Department Provider Note   First MD Initiated Contact with Patient 01/04/19 2024     (approximate)  I have reviewed the triage vital signs and the nursing notes.   HISTORY  Chief Complaint Flank Pain    HPI Tonya Keith is a 58 y.o. female with below list of previous medical conditions presents to the emergency department secondary to 2-week history of left flank pain with current pain score of 6 out of 10.  Patient denies any nausea no vomiting or constipation.  Patient denies any fever.  Patient denies any urinary symptoms        Past Medical History:  Diagnosis Date  . Breast cancer ([place]) 2008   chemo mastectomy left  . Cancer ([facility])    breast  . Hypertension   . Personal history of chemotherapy     Patient Active Problem List   Diagnosis Date Noted  . Hypothermia 12/03/2017  . Bilateral lower extremity pain 11/17/2017  . Bilateral lower extremity edema 11/17/2017  . Tobacco abuse 11/17/2017  . Essential hypertension 11/17/2017  . Hyperlipidemia 11/17/2017    Past Surgical History:  Procedure Laterality Date  . ABDOMINAL HYSTERECTOMY    . MASTECTOMY Left 2008    Prior to Admission medications   Medication Sig Start Date End Date Taking? Authorizing Provider  amitriptyline (ELAVIL) 25 MG tablet Take 25 mg by mouth at bedtime.  05/31/09   [provider]  amLODipine (NORVASC) 10 MG tablet Take 10 mg by mouth daily.  10/27/17   [provider]  aspirin EC 81 MG tablet Take 81 mg by mouth daily.     [provider]  cetirizine (ZYRTEC) 10 MG tablet Take 10 mg by mouth daily.     [provider]  cloNIDine (CATAPRES) 0.2 MG tablet Take 0.2 mg by mouth daily.  10/27/17   [provider]  cyclobenzaprine (FLEXERIL) 10 MG tablet Take 1 tablet (10 mg total) by mouth 3 (three) times daily as needed. 01/04/19   Gregor Hams, MD  lovastatin (MEVACOR) 40 MG tablet Take 40 mg by mouth  daily.     [provider]  metoprolol succinate (TOPROL-XL) 100 MG 24 hr tablet Take 100 mg by mouth daily.  10/27/17   [provider]    Allergies Pollen extract and Latex  Family History  Problem Relation Age of Onset  . Breast cancer Maternal Aunt   . Heart disease Father   . Hypertension Father   . Cancer Sister     Social History Social History   Tobacco Use  . Smoking status: Current Every Day Smoker    Packs/day: 0.50    Types: Cigarettes  . Smokeless tobacco: Never Used  Substance Use Topics  . Alcohol use: Yes    Comment: occassional   . Drug use: Yes    Types: Marijuana    Comment: "every once in a while"    Review of Systems Constitutional: No fever/chills Eyes: No visual changes. ENT: No sore throat. Cardiovascular: Denies chest pain. Respiratory: Denies shortness of breath. Gastrointestinal: Positive for left flank pain no nausea, no vomiting.  No diarrhea.  No constipation. Genitourinary: Negative for dysuria. Musculoskeletal: Negative for neck pain.  Negative for back pain. Integumentary: Negative for rash. Neurological: Negative for headaches, focal weakness or numbness.  ____________________________________________   PHYSICAL EXAM:  VITAL SIGNS: ED Triage Vitals  Enc Vitals Group     BP 01/04/19 1813 117/73  Pulse Rate 01/04/19 1813 69     Resp 01/04/19 1813 20     Temp 01/04/19 1813 98.4 F (36.9 C)     Temp Source 01/04/19 1813 Oral     SpO2 01/04/19 1813 96 %     Weight 01/04/19 1813 55.8 kg (123 lb)     Height 01/04/19 1813 1.549 m (5\' 1" )     Head Circumference --      Peak Flow --      Pain Score 01/04/19 1819 6     Pain Loc --      Pain Edu? --      Excl. in [place]? --     Constitutional: Alert and oriented.  Eyes: Conjunctivae are normal.  Mouth/Throat: Mucous membranes are moist. Neck: No stridor.  No meningeal signs.   Cardiovascular: Normal rate, regular rhythm. Good peripheral circulation. Grossly  normal heart sounds. Respiratory: Normal respiratory effort.  No retractions. Gastrointestinal: Soft and nontender. No distention.  Musculoskeletal: No lower extremity tenderness nor edema. No gross deformities of extremities. Neurologic:  Normal speech and language. No gross focal neurologic deficits are appreciated.  Skin:  Skin is warm, dry and intact. Psychiatric: Mood and affect are normal. Speech and behavior are normal.  ____________________________________________   LABS (all labs ordered are listed, but only abnormal results are displayed)  Labs Reviewed  URINALYSIS, COMPLETE (UACMP) WITH MICROSCOPIC - Abnormal; Notable for the following components:      Result Value   Color, Urine YELLOW (*)    APPearance HAZY (*)    Protein, ur 30 (*)    Leukocytes,Ua TRACE (*)    All other components within normal limits  CBC - Abnormal; Notable for the following components:   WBC 11.2 (*)    HCT 35.8 (*)    MCV 79.2 (*)    All other components within normal limits  BASIC METABOLIC PANEL - Abnormal; Notable for the following components:   Glucose, Bld 130 (*)    Creatinine, Ser 1.12 (*)    GFR calc non Af Amer 53 (*)    All other components within normal limits     RADIOLOGY I, [place] Bree Heinzelman, personally viewed and evaluated these images (plain radiographs) as part of my medical decision making, as well as reviewing the written report by the radiologist.  ED MD interpretation: No CT findings to account for the patient's left flank pain per radiologist.  Official radiology report(s): Ct Renal Stone Study  Result Date: 01/04/2019 CLINICAL DATA:  Left flank pain x2 weeks EXAM: CT ABDOMEN AND PELVIS WITHOUT CONTRAST TECHNIQUE: Multidetector CT imaging of the abdomen and pelvis was performed following the standard protocol without IV contrast. COMPARISON:  None. FINDINGS: Lower chest: Lung bases are clear. Hepatobiliary: Unenhanced liver is unremarkable. Gallbladder is unremarkable.  No intrahepatic or extrahepatic duct dilatation. Pancreas: Within normal limits. Spleen: Within normal limits. Adrenals/Urinary Tract: Adrenal glands are within normal limits. Kidneys are within normal limits. No renal, ureteral, or bladder calculi. No hydronephrosis. Bladder is within normal limits. Stomach/Bowel: Stomach is within normal limits. No evidence of bowel obstruction. Normal appendix (series 2/image 48). Vascular/Lymphatic: No evidence of abdominal aortic aneurysm. Atherosclerotic calcifications of the abdominal aorta and branch vessels. No suspicious abdominopelvic lymphadenopathy. Reproductive: Status post hysterectomy. Bilateral ovaries are within normal limits. Other: No abdominopelvic ascites. Musculoskeletal: Visualized osseous structures are within normal limits. IMPRESSION: No renal, ureteral, or bladder calculi.  No hydronephrosis. No CT findings to account for the patient's left flank pain. Electronically Signed  By: Julian Hy M.D.   On: 01/04/2019 21:05      Procedures   ____________________________________________   INITIAL IMPRESSION / MDM / [place] / ED COURSE  As part of my medical decision making, I reviewed the following data within the electronic MEDICAL RECORD NUMBER   58 year old female presented with above-stated history and physical exam secondary to left flank pain.  Considered possibly of ureterolithiasis diverticulitis versus musculoskeletal etiology.  CT renal performed which revealed no CT explanation for the patient's pain per radiologist.  Urinalysis revealed sterile pyuria     ____________________________________________  FINAL CLINICAL IMPRESSION(S) / ED DIAGNOSES  Final diagnoses:  Flank pain     MEDICATIONS GIVEN DURING THIS VISIT:  Medications - No data to display   ED Discharge Orders         Ordered    cyclobenzaprine (FLEXERIL) 10 MG tablet  3 times daily PRN     01/04/19 2139          *Please note:  SHAUNTELL IGLESIA was evaluated in Emergency Department on 01/04/2019 for the symptoms described in the history of present illness. She was evaluated in the context of the global COVID-19 pandemic, which necessitated consideration that the patient might be at risk for infection with the SARS-CoV-2 virus that causes COVID-19. Institutional protocols and algorithms that pertain to the evaluation of patients at risk for COVID-19 are in a state of rapid change based on information released by regulatory bodies including the CDC and federal and state organizations. These policies and algorithms were followed during the patient's care in the ED.  Some ED evaluations and interventions may be delayed as a result of limited staffing during the pandemic.*  Note:  This document was prepared using Dragon voice recognition software and may include unintentional dictation errors.   Gregor Hams, MD 01/04/19 2142

## 2019-01-04 NOTE — ED Notes (Signed)
Patient reports that she "drinks a lot of soda"

## 2019-03-06 DIAGNOSIS — J449 Chronic obstructive pulmonary disease, unspecified: Secondary | ICD-10-CM | POA: Insufficient documentation

## 2019-03-06 DIAGNOSIS — M8580 Other specified disorders of bone density and structure, unspecified site: Secondary | ICD-10-CM | POA: Insufficient documentation

## 2019-03-06 DIAGNOSIS — F331 Major depressive disorder, recurrent, moderate: Secondary | ICD-10-CM | POA: Insufficient documentation

## 2019-03-06 DIAGNOSIS — C50912 Malignant neoplasm of unspecified site of left female breast: Secondary | ICD-10-CM | POA: Insufficient documentation

## 2019-03-06 DIAGNOSIS — E559 Vitamin D deficiency, unspecified: Secondary | ICD-10-CM | POA: Insufficient documentation

## 2019-03-06 DIAGNOSIS — I2510 Atherosclerotic heart disease of native coronary artery without angina pectoris: Secondary | ICD-10-CM | POA: Insufficient documentation

## 2019-03-22 DIAGNOSIS — D582 Other hemoglobinopathies: Secondary | ICD-10-CM | POA: Insufficient documentation

## 2019-05-18 ENCOUNTER — Other Ambulatory Visit: Payer: Self-pay | Admitting: Family Medicine

## 2019-05-18 DIAGNOSIS — Z1231 Encounter for screening mammogram for malignant neoplasm of breast: Secondary | ICD-10-CM

## 2019-06-22 ENCOUNTER — Ambulatory Visit
Admission: RE | Admit: 2019-06-22 | Discharge: 2019-06-22 | Disposition: A | Discharge status: Home / Self Care | Payer: Medicare HMO | Source: Ambulatory Visit | Attending: Family Medicine | Admitting: Family Medicine

## 2019-06-22 DIAGNOSIS — Z1231 Encounter for screening mammogram for malignant neoplasm of breast: Secondary | ICD-10-CM | POA: Diagnosis present

## 2019-08-16 ENCOUNTER — Emergency Department: Payer: Medicare Other

## 2019-08-16 ENCOUNTER — Other Ambulatory Visit: Payer: Self-pay

## 2019-08-16 ENCOUNTER — Emergency Department
Admission: EM | Admit: 2019-08-16 | Discharge: 2019-08-16 | Disposition: A | Discharge status: Home / Self Care | Payer: Medicare Other | Attending: Student in an Organized Health Care Education/Training Program | Admitting: Student in an Organized Health Care Education/Training Program

## 2019-08-16 DIAGNOSIS — F17210 Nicotine dependence, cigarettes, uncomplicated: Secondary | ICD-10-CM | POA: Diagnosis not present

## 2019-08-16 DIAGNOSIS — Z79899 Other long term (current) drug therapy: Secondary | ICD-10-CM | POA: Diagnosis not present

## 2019-08-16 DIAGNOSIS — I10 Essential (primary) hypertension: Secondary | ICD-10-CM | POA: Diagnosis not present

## 2019-08-16 DIAGNOSIS — Z91040 Latex allergy status: Secondary | ICD-10-CM | POA: Insufficient documentation

## 2019-08-16 DIAGNOSIS — R0789 Other chest pain: Secondary | ICD-10-CM | POA: Insufficient documentation

## 2019-08-16 DIAGNOSIS — Z7982 Long term (current) use of aspirin: Secondary | ICD-10-CM | POA: Insufficient documentation

## 2019-08-16 DIAGNOSIS — R079 Chest pain, unspecified: Secondary | ICD-10-CM

## 2019-08-16 LAB — CBC
HCT: 41.1 % (ref 36.0–46.0)
Hemoglobin: 14.5 g/dL (ref 12.0–15.0)
MCH: 27.3 pg (ref 26.0–34.0)
MCHC: 35.3 g/dL (ref 30.0–36.0)
MCV: 77.3 fL — ABNORMAL LOW (ref 80.0–100.0)
Platelets: 276 10*3/uL (ref 150–400)
RBC: 5.32 MIL/uL — ABNORMAL HIGH (ref 3.87–5.11)
RDW: 14.1 % (ref 11.5–15.5)
WBC: 11.9 10*3/uL — ABNORMAL HIGH (ref 4.0–10.5)
nRBC: 0 % (ref 0.0–0.2)

## 2019-08-16 LAB — BASIC METABOLIC PANEL
Anion gap: 8 (ref 5–15)
BUN: 10 mg/dL (ref 8–23)
CO2: 25 mmol/L (ref 22–32)
Calcium: 9.3 mg/dL (ref 8.9–10.3)
Chloride: 106 mmol/L (ref 98–111)
Creatinine, Ser: 0.62 mg/dL (ref 0.44–1.00)
GFR calc Af Amer: >60 mL/min (ref >60)
GFR calc non Af Amer: >60 mL/min (ref >60)
Glucose, Bld: 126 mg/dL — ABNORMAL HIGH (ref 70–99)
Potassium: 4.2 mmol/L (ref 3.5–5.1)
Sodium: 139 mmol/L (ref 135–145)

## 2019-08-16 LAB — TROPONIN I (HIGH SENSITIVITY)
Troponin I (High Sensitivity): <2 ng/L (ref <18)
Troponin I (High Sensitivity): <2 ng/L (ref <18)

## 2019-08-16 LAB — FIBRIN DERIVATIVES D-DIMER (ARMC ONLY): Fibrin derivatives D-dimer (ARMC): 503.85 ng/mL (FEU) — ABNORMAL HIGH (ref 0.00–499.00)

## 2019-08-16 MED ORDER — IOHEXOL 350 MG/ML SOLN
75.0000 mL | Freq: Once | INTRAVENOUS | Status: AC | PRN
  Administered 2019-08-16: 75 mL via INTRAVENOUS
  Filled 2019-08-16: qty 75

## 2019-08-16 MED ORDER — ONDANSETRON HCL 4 MG/2ML IJ SOLN
4.0000 mg | Freq: Once | INTRAMUSCULAR | Status: DC
  Filled 2019-08-16: qty 2

## 2019-08-16 MED ORDER — SODIUM CHLORIDE 0.9% FLUSH: 3.0000 mL | Freq: Once | INTRAVENOUS | Status: DC

## 2019-08-16 MED ORDER — MORPHINE SULFATE (PF) 4 MG/ML IV SOLN
4.0000 mg | INTRAVENOUS | Status: DC | PRN
  Filled 2019-08-16: qty 1

## 2019-08-16 NOTE — ED Provider Notes (Signed)
[facility] Emergency Department Provider Note    First MD Initiated Contact with Patient 08/16/19 1624     (approximate)  I have reviewed the triage vital signs and the nursing notes.   HISTORY  Chief Complaint Chest Pain    HPI Tonya Keith is a 59 y.o. female the below listed past medical history presents to the ER for sharp stabbing intermittent anterior lateral chest pain.  No associated diaphoresis.  Some discomfort with taking deep inspiration.  Does have a history of COPD has not had any worsening shortness of breath or cough.  No measured fevers.  States that last episode of pain was shortly prior to arrival.  States that last several minutes to an hour in nature.  Does not feel any palpitations.  No nausea or vomiting.    Past Medical History:  Diagnosis Date  . Breast cancer ([place]) 2008   chemo mastectomy left  . Cancer ([facility])    breast  . Hypertension   . Personal history of chemotherapy    Family History  Problem Relation Age of Onset  . Breast cancer Maternal Aunt   . Heart disease Father   . Hypertension Father   . Cancer Sister    Past Surgical History:  Procedure Laterality Date  . ABDOMINAL HYSTERECTOMY    . MASTECTOMY Left 2008   Patient Active Problem List   Diagnosis Date Noted  . Hypothermia 12/03/2017  . Bilateral lower extremity pain 11/17/2017  . Bilateral lower extremity edema 11/17/2017  . Tobacco abuse 11/17/2017  . Essential hypertension 11/17/2017  . Hyperlipidemia 11/17/2017      Prior to Admission medications   Medication Sig Start Date End Date Taking? Authorizing Provider  amitriptyline (ELAVIL) 25 MG tablet Take 25 mg by mouth at bedtime.  05/31/09   [provider]  amLODipine (NORVASC) 10 MG tablet Take 10 mg by mouth daily.  10/27/17   [provider]  aspirin EC 81 MG tablet Take 81 mg by mouth daily.     [provider]  cetirizine (ZYRTEC) 10 MG tablet Take 10 mg by mouth  daily.     [provider]  cloNIDine (CATAPRES) 0.2 MG tablet Take 0.2 mg by mouth daily.  10/27/17   [provider]  cyclobenzaprine (FLEXERIL) 10 MG tablet Take 1 tablet (10 mg total) by mouth 3 (three) times daily as needed. 01/04/19   Gregor Hams, MD  lovastatin (MEVACOR) 40 MG tablet Take 40 mg by mouth daily.     [provider]  metoprolol succinate (TOPROL-XL) 100 MG 24 hr tablet Take 100 mg by mouth daily.  10/27/17   [provider]    Allergies Pollen extract and Latex    Social History Social History   Tobacco Use  . Smoking status: Current Every Day Smoker    Packs/day: 0.50    Types: Cigarettes  . Smokeless tobacco: Never Used  Substance Use Topics  . Alcohol use: Yes    Comment: occassional   . Drug use: Yes    Types: Marijuana    Comment: "every once in a while"    Review of Systems Patient denies headaches, rhinorrhea, blurry vision, numbness, shortness of breath, chest pain, edema, cough, abdominal pain, nausea, vomiting, diarrhea, dysuria, fevers, rashes or hallucinations unless otherwise stated above in HPI. ____________________________________________   PHYSICAL EXAM:  VITAL SIGNS: Vitals:   08/16/19 1315 08/16/19 1644  BP: 122/70 (!) 141/72  Pulse: 60 (!) 59  Resp: 16 13  Temp: 98.4 F (36.9 C)   SpO2: 99% 99%    Constitutional: Alert and oriented.  Eyes: Conjunctivae are normal.  Head: Atraumatic. Nose: No congestion/rhinnorhea. Mouth/Throat: Mucous membranes are moist.   Neck: No stridor. Painless ROM.  Cardiovascular: Normal rate, regular rhythm. Grossly normal heart sounds.  Good peripheral circulation. Respiratory: Normal respiratory effort.  No retractions. Lungs CTAB. Gastrointestinal: Soft and nontender. No distention. No abdominal bruits. No CVA tenderness. Genitourinary:  Musculoskeletal: No lower extremity tenderness nor edema.  No joint effusions. Neurologic:  Normal speech and language.  No gross focal neurologic deficits are appreciated. No facial droop Skin:  Skin is warm, dry and intact. No rash noted. Psychiatric: Mood and affect are normal. Speech and behavior are normal.  ____________________________________________   LABS (all labs ordered are listed, but only abnormal results are displayed)  Results for orders placed or performed during the hospital encounter of 08/16/19 (from the past 24 hour(s))  Basic metabolic panel     Status: Abnormal   Collection Time: 08/16/19  1:17 PM  Result Value Ref Range   Sodium 139 135 - 145 mmol/L   Potassium 4.2 3.5 - 5.1 mmol/L   Chloride 106 98 - 111 mmol/L   CO2 25 22 - 32 mmol/L   Glucose, Bld 126 (H) 70 - 99 mg/dL   BUN 10 8 - 23 mg/dL   Creatinine, Ser 0.62 0.44 - 1.00 mg/dL   Calcium 9.3 8.9 - 10.3 mg/dL   GFR calc non Af Amer >60 >60 mL/min   GFR calc Af Amer >60 >60 mL/min   Anion gap 8 5 - 15  CBC     Status: Abnormal   Collection Time: 08/16/19  1:17 PM  Result Value Ref Range   WBC 11.9 (H) 4.0 - 10.5 K/uL   RBC 5.32 (H) 3.87 - 5.11 MIL/uL   Hemoglobin 14.5 12.0 - 15.0 g/dL   HCT 41.1 36.0 - 46.0 %   MCV 77.3 (L) 80.0 - 100.0 fL   MCH 27.3 26.0 - 34.0 pg   MCHC 35.3 30.0 - 36.0 g/dL   RDW 14.1 11.5 - 15.5 %   Platelets 276 150 - 400 K/uL   nRBC 0.0 0.0 - 0.2 %  Troponin I (High Sensitivity)     Status: None   Collection Time: 08/16/19  1:17 PM  Result Value Ref Range   Troponin I (High Sensitivity) <2 <18 ng/L  Fibrin derivatives D-Dimer ([facility] only)     Status: Abnormal   Collection Time: 08/16/19  4:48 PM  Result Value Ref Range   Fibrin derivatives D-dimer ([facility]) 503.85 (H) 0.00 - 499.00 ng/mL (FEU)  Troponin I (High Sensitivity)     Status: None   Collection Time: 08/16/19  5:15 PM  Result Value Ref Range   Troponin I (High Sensitivity) <2 <18 ng/L   ____________________________________________  EKG My review and personal interpretation at Time: 14:05   Indication: chest pain  Rate: 60   Rhythm: sinus Axis: normal Other: lbbb, ocasional pvc, no stemi ____________________________________________  RADIOLOGY  I personally reviewed all radiographic images ordered to evaluate for the above acute complaints and reviewed radiology reports and findings.  These findings were personally discussed with the patient.  Please see medical record for radiology report.  ____________________________________________   PROCEDURES  Procedure(s) performed:  Procedures    Critical Care performed: no ____________________________________________   INITIAL IMPRESSION / ASSESSMENT AND PLAN / ED COURSE  Pertinent labs & imaging results that  were available during my care of the patient were reviewed by me and considered in my medical decision making (see chart for details).   DDX: ACS, pericarditis, esophagitis, boerhaaves, pe, dissection, pna, bronchitis, costochondritis   Renesmae A Mans is a 59 y.o. who presents to the ED with symptoms as described above.  Does have been having intermittent chest discomfort for the past several days.  Initial enzyme is negative and patient does have known coronary disease.  Do not see any evidence of acute ischemia on her ekg and she is currently pain-free given her history D-dimer ordered to evaluate further stratify for PE.  D-dimer is elevated therefore CT imaging ordered.  CTa without evidence of PE.  No wheezing on exam.  Will observe for serial enzyme.  Clinical Course as of Aug 15 2117  Tue Aug 16, 2019  [id number] Patient reassessed.  She feels well.  Denies any persistent discomfort.  Work-up has been reassuring.  At this mildly she is cleared and stable and appropriate for outpatient follow-up.   [PR]    Clinical Course User Index [PR] Merlyn Lot, MD    The patient was evaluated in Emergency Department today for the symptoms described in the history of present illness. He/she was evaluated in the context of the global COVID-19 pandemic, which  necessitated consideration that the patient might be at risk for infection with the SARS-CoV-2 virus that causes COVID-19. Institutional protocols and algorithms that pertain to the evaluation of patients at risk for COVID-19 are in a state of rapid change based on information released by regulatory bodies including the CDC and federal and state organizations. These policies and algorithms were followed during the patient's care in the ED.  As part of my medical decision making, I reviewed the following data within the [facility] notes reviewed and incorporated, Labs reviewed, notes from prior ED visits and [place] Controlled Substance Database   ____________________________________________   FINAL CLINICAL IMPRESSION(S) / ED DIAGNOSES  Final diagnoses:  Chest pain, unspecified type      NEW MEDICATIONS STARTED DURING THIS VISIT:  Discharge Medication List as of 08/16/2019  6:36 PM       Note:  This document was prepared using Dragon voice recognition software and may include unintentional dictation errors.    Merlyn Lot, MD 08/16/19 2119

## 2019-08-16 NOTE — ED Triage Notes (Signed)
Pt in via EMS from home with stabbing like chest pain for the last few days. Pt with hx of MI, HTN. Pt reported to EMS that pain was non radiating. #20g to right AC, Pt had a mastectomy to right side.  bp 129/77, CBG 204, 20RR, 99%RA

## 2019-08-16 NOTE — ED Triage Notes (Signed)
Pt comes via [facility] from home with c/o chest pain left sided that started a couple of weeks ago. Pt states it has since gotten worse. Pt states some SOB as well.  Pt denies any N/V/D. Pt states headache.

## 2019-08-16 NOTE — Discharge Instructions (Addendum)
  IMPRESSION:  1. Negative for acute pulmonary embolus.  2. Chronic Emphysema (ICD10-J43.9) with two tiny new right upper  lobe lung nodules since a CT in August. If the patient is no longer  undergoing regular cancer restaging by CT then repeat Non-contrast  chest CT can be considered in 12 months.  This recommendation follows the consensus statement: Guidelines for  Management of Incidental Pulmonary Nodules Detected on CT Images:  From the Fleischner Society 2017; Radiology 2017; 284:228-243.  3. Calcified coronary artery and Aortic Atherosclerosis  (ICD10-I70.0).        Electronically Signed    By: Genevie Ann M.D.    On: 08/16/2019 18:25

## 2019-08-23 ENCOUNTER — Other Ambulatory Visit
Admission: RE | Admit: 2019-08-23 | Discharge: 2019-08-23 | Disposition: A | Discharge status: Home / Self Care | Payer: Medicare Other | Source: Ambulatory Visit | Attending: Student | Admitting: Student

## 2019-08-23 DIAGNOSIS — Z79899 Other long term (current) drug therapy: Secondary | ICD-10-CM | POA: Diagnosis present

## 2019-08-23 DIAGNOSIS — Z01812 Encounter for preprocedural laboratory examination: Secondary | ICD-10-CM | POA: Insufficient documentation

## 2019-08-23 LAB — BRAIN NATRIURETIC PEPTIDE: B Natriuretic Peptide: 46 pg/mL (ref 0.0–100.0)

## 2019-08-28 DIAGNOSIS — G4709 Other insomnia: Secondary | ICD-10-CM | POA: Insufficient documentation

## 2019-09-02 ENCOUNTER — Other Ambulatory Visit
Admission: RE | Admit: 2019-09-02 | Discharge: 2019-09-02 | Disposition: A | Discharge status: Home / Self Care | Payer: Medicare Other | Source: Ambulatory Visit | Attending: Internal Medicine | Admitting: Internal Medicine

## 2019-09-02 DIAGNOSIS — Z20822 Contact with and (suspected) exposure to covid-19: Secondary | ICD-10-CM | POA: Insufficient documentation

## 2019-09-02 DIAGNOSIS — Z01812 Encounter for preprocedural laboratory examination: Secondary | ICD-10-CM | POA: Insufficient documentation

## 2019-09-02 LAB — SARS CORONAVIRUS 2 (TAT 6-24 HRS): SARS Coronavirus 2: NEGATIVE

## 2019-09-05 ENCOUNTER — Other Ambulatory Visit: Payer: Medicare Other

## 2019-09-07 ENCOUNTER — Encounter: Payer: Self-pay | Admitting: Internal Medicine

## 2019-09-07 ENCOUNTER — Encounter
Admission: RE | Disposition: A | Discharge status: Home / Self Care | Payer: Self-pay | Source: Home / Self Care | Attending: Internal Medicine

## 2019-09-07 ENCOUNTER — Ambulatory Visit
Admission: RE | Admit: 2019-09-07 | Discharge: 2019-09-07 | Disposition: A | Discharge status: Home / Self Care | Payer: Medicare Other | Attending: Internal Medicine | Admitting: Internal Medicine

## 2019-09-07 ENCOUNTER — Other Ambulatory Visit: Payer: Self-pay

## 2019-09-07 DIAGNOSIS — E785 Hyperlipidemia, unspecified: Secondary | ICD-10-CM | POA: Diagnosis not present

## 2019-09-07 DIAGNOSIS — Z955 Presence of coronary angioplasty implant and graft: Secondary | ICD-10-CM | POA: Diagnosis not present

## 2019-09-07 DIAGNOSIS — I25119 Atherosclerotic heart disease of native coronary artery with unspecified angina pectoris: Secondary | ICD-10-CM | POA: Diagnosis present

## 2019-09-07 DIAGNOSIS — Z7982 Long term (current) use of aspirin: Secondary | ICD-10-CM | POA: Diagnosis not present

## 2019-09-07 DIAGNOSIS — F329 Major depressive disorder, single episode, unspecified: Secondary | ICD-10-CM | POA: Insufficient documentation

## 2019-09-07 DIAGNOSIS — I10 Essential (primary) hypertension: Secondary | ICD-10-CM | POA: Insufficient documentation

## 2019-09-07 DIAGNOSIS — E7800 Pure hypercholesterolemia, unspecified: Secondary | ICD-10-CM | POA: Insufficient documentation

## 2019-09-07 DIAGNOSIS — Z853 Personal history of malignant neoplasm of breast: Secondary | ICD-10-CM | POA: Diagnosis not present

## 2019-09-07 DIAGNOSIS — E119 Type 2 diabetes mellitus without complications: Secondary | ICD-10-CM | POA: Insufficient documentation

## 2019-09-07 DIAGNOSIS — F17210 Nicotine dependence, cigarettes, uncomplicated: Secondary | ICD-10-CM | POA: Diagnosis not present

## 2019-09-07 DIAGNOSIS — I209 Angina pectoris, unspecified: Secondary | ICD-10-CM

## 2019-09-07 DIAGNOSIS — J449 Chronic obstructive pulmonary disease, unspecified: Secondary | ICD-10-CM | POA: Insufficient documentation

## 2019-09-07 DIAGNOSIS — Z79899 Other long term (current) drug therapy: Secondary | ICD-10-CM | POA: Diagnosis not present

## 2019-09-07 DIAGNOSIS — I252 Old myocardial infarction: Secondary | ICD-10-CM | POA: Insufficient documentation

## 2019-09-07 HISTORY — DX: Atherosclerotic heart disease of native coronary artery without angina pectoris: I25.10

## 2019-09-07 HISTORY — DX: Angina pectoris, unspecified: I20.9

## 2019-09-07 HISTORY — PX: LEFT HEART CATH AND CORONARY ANGIOGRAPHY: CATH118249

## 2019-09-07 HISTORY — DX: Other specified postprocedural states: Z98.890

## 2019-09-07 SURGERY — LEFT HEART CATH AND CORONARY ANGIOGRAPHY
Anesthesia: Moderate Sedation | Laterality: Left

## 2019-09-07 MED ORDER — MIDAZOLAM HCL 2 MG/2ML IJ SOLN
INTRAMUSCULAR | Status: AC
  Filled 2019-09-07: qty 2

## 2019-09-07 MED ORDER — SODIUM CHLORIDE 0.9% FLUSH: 3.0000 mL | INTRAVENOUS | Status: DC | PRN

## 2019-09-07 MED ORDER — SODIUM CHLORIDE 0.9 % IV SOLN: 250.0000 mL | INTRAVENOUS | Status: DC | PRN

## 2019-09-07 MED ORDER — SODIUM CHLORIDE 0.9 % WEIGHT BASED INFUSION: 1.0000 mL/kg/h | INTRAVENOUS | Status: DC

## 2019-09-07 MED ORDER — ASPIRIN 81 MG PO CHEW
CHEWABLE_TABLET | ORAL | Status: AC
  Filled 2019-09-07: qty 1

## 2019-09-07 MED ORDER — SODIUM CHLORIDE 0.9 % WEIGHT BASED INFUSION
3.0000 mL/kg/h | INTRAVENOUS | Status: AC
  Administered 2019-09-07: 11:00:00 3 mL/kg/h via INTRAVENOUS

## 2019-09-07 MED ORDER — ACETAMINOPHEN 325 MG PO TABS: 650.0000 mg | ORAL_TABLET | ORAL | Status: DC | PRN

## 2019-09-07 MED ORDER — SODIUM CHLORIDE 0.9% FLUSH: 3.0000 mL | Freq: Two times a day (BID) | INTRAVENOUS | Status: DC

## 2019-09-07 MED ORDER — ASPIRIN 81 MG PO CHEW
81.0000 mg | CHEWABLE_TABLET | ORAL | Status: AC
  Administered 2019-09-07: 81 mg via ORAL

## 2019-09-07 MED ORDER — HEPARIN (PORCINE) IN NACL 1000-0.9 UT/500ML-% IV SOLN
INTRAVENOUS | Status: AC
  Filled 2019-09-07: qty 1000

## 2019-09-07 MED ORDER — HEPARIN (PORCINE) IN NACL 1000-0.9 UT/500ML-% IV SOLN
INTRAVENOUS | Status: DC | PRN
  Administered 2019-09-07: 500 mL

## 2019-09-07 MED ORDER — FENTANYL CITRATE (PF) 100 MCG/2ML IJ SOLN
INTRAMUSCULAR | Status: AC
  Filled 2019-09-07: qty 2

## 2019-09-07 MED ORDER — FENTANYL CITRATE (PF) 100 MCG/2ML IJ SOLN
INTRAMUSCULAR | Status: DC | PRN
  Administered 2019-09-07 (×2): 25 ug via INTRAVENOUS

## 2019-09-07 MED ORDER — LABETALOL HCL 5 MG/ML IV SOLN: 10.0000 mg | INTRAVENOUS | Status: DC | PRN

## 2019-09-07 MED ORDER — ONDANSETRON HCL 4 MG/2ML IJ SOLN: 4.0000 mg | Freq: Four times a day (QID) | INTRAMUSCULAR | Status: DC | PRN

## 2019-09-07 MED ORDER — HYDRALAZINE HCL 20 MG/ML IJ SOLN: 10.0000 mg | INTRAMUSCULAR | Status: DC | PRN

## 2019-09-07 MED ORDER — IOHEXOL 300 MG/ML  SOLN
INTRAMUSCULAR | Status: DC | PRN
  Administered 2019-09-07: 70 mL

## 2019-09-07 MED ORDER — MIDAZOLAM HCL 2 MG/2ML IJ SOLN
INTRAMUSCULAR | Status: DC | PRN
  Administered 2019-09-07 (×2): 0.5 mg via INTRAVENOUS

## 2019-09-07 SURGICAL SUPPLY — 9 items
CATH INFINITI 5FR ANG PIGTAIL (CATHETERS) IMPLANT
CATH INFINITI 5FR JL4 (CATHETERS) ×3 IMPLANT
CATH INFINITI JR4 5F (CATHETERS) ×3 IMPLANT
DEVICE CLOSURE MYNXGRIP 5F (Vascular Products) ×3 IMPLANT
KIT MANI 3VAL PERCEP (MISCELLANEOUS) ×3 IMPLANT
NEEDLE PERC 18GX7CM (NEEDLE) ×3 IMPLANT
PACK CARDIAC CATH (CUSTOM PROCEDURE TRAY) ×3 IMPLANT
SHEATH AVANTI 5FR X 11CM (SHEATH) ×3 IMPLANT
WIRE GUIDERIGHT .035X150 (WIRE) ×3 IMPLANT

## 2019-09-08 ENCOUNTER — Encounter: Payer: Self-pay | Admitting: Cardiology

## 2019-10-13 ENCOUNTER — Other Ambulatory Visit: Payer: Self-pay

## 2019-10-13 ENCOUNTER — Ambulatory Visit
Admission: RE | Admit: 2019-10-13 | Discharge: 2019-10-13 | Disposition: A | Discharge status: Home / Self Care | Payer: Medicare Other | Attending: Family Medicine | Admitting: Family Medicine

## 2019-10-13 ENCOUNTER — Other Ambulatory Visit: Payer: Self-pay | Admitting: Family Medicine

## 2019-10-13 ENCOUNTER — Ambulatory Visit
Admission: RE | Admit: 2019-10-13 | Discharge: 2019-10-13 | Disposition: A | Discharge status: Home / Self Care | Payer: Medicare Other | Source: Ambulatory Visit | Attending: Family Medicine | Admitting: Family Medicine

## 2019-10-13 DIAGNOSIS — G8929 Other chronic pain: Secondary | ICD-10-CM | POA: Diagnosis present

## 2019-10-13 DIAGNOSIS — M25562 Pain in left knee: Secondary | ICD-10-CM

## 2019-10-13 DIAGNOSIS — M25561 Pain in right knee: Secondary | ICD-10-CM | POA: Insufficient documentation

## 2019-11-09 ENCOUNTER — Other Ambulatory Visit: Payer: Self-pay | Admitting: Family Medicine

## 2019-11-09 DIAGNOSIS — M79604 Pain in right leg: Secondary | ICD-10-CM

## 2019-11-16 ENCOUNTER — Ambulatory Visit: Admission: RE | Admit: 2019-11-16 | Payer: Medicare Other | Source: Ambulatory Visit

## 2019-11-22 ENCOUNTER — Ambulatory Visit: Payer: Medicare Other

## 2019-12-01 ENCOUNTER — Ambulatory Visit: Payer: Medicare Other

## 2019-12-09 ENCOUNTER — Other Ambulatory Visit: Payer: Self-pay

## 2019-12-09 ENCOUNTER — Ambulatory Visit
Admission: RE | Admit: 2019-12-09 | Discharge: 2019-12-09 | Disposition: A | Discharge status: Home / Self Care | Payer: Medicare Other | Source: Ambulatory Visit | Attending: Family Medicine | Admitting: Family Medicine

## 2019-12-09 DIAGNOSIS — M79604 Pain in right leg: Secondary | ICD-10-CM | POA: Insufficient documentation

## 2019-12-09 DIAGNOSIS — M79605 Pain in left leg: Secondary | ICD-10-CM | POA: Diagnosis present

## 2020-01-13 ENCOUNTER — Emergency Department
Admission: EM | Admit: 2020-01-13 | Discharge: 2020-01-13 | Disposition: A | Discharge status: Home / Self Care | Payer: Medicare Other | Attending: Emergency Medicine | Admitting: Emergency Medicine

## 2020-01-13 ENCOUNTER — Encounter: Payer: Self-pay | Admitting: Emergency Medicine

## 2020-01-13 ENCOUNTER — Emergency Department: Payer: Medicare Other

## 2020-01-13 ENCOUNTER — Other Ambulatory Visit: Payer: Self-pay

## 2020-01-13 DIAGNOSIS — I2510 Atherosclerotic heart disease of native coronary artery without angina pectoris: Secondary | ICD-10-CM | POA: Insufficient documentation

## 2020-01-13 DIAGNOSIS — R42 Dizziness and giddiness: Secondary | ICD-10-CM

## 2020-01-13 DIAGNOSIS — F17210 Nicotine dependence, cigarettes, uncomplicated: Secondary | ICD-10-CM | POA: Diagnosis not present

## 2020-01-13 DIAGNOSIS — I10 Essential (primary) hypertension: Secondary | ICD-10-CM | POA: Diagnosis not present

## 2020-01-13 DIAGNOSIS — H53149 Visual discomfort, unspecified: Secondary | ICD-10-CM | POA: Diagnosis not present

## 2020-01-13 DIAGNOSIS — Z79899 Other long term (current) drug therapy: Secondary | ICD-10-CM | POA: Diagnosis not present

## 2020-01-13 DIAGNOSIS — G43009 Migraine without aura, not intractable, without status migrainosus: Secondary | ICD-10-CM | POA: Diagnosis not present

## 2020-01-13 DIAGNOSIS — Z7982 Long term (current) use of aspirin: Secondary | ICD-10-CM | POA: Insufficient documentation

## 2020-01-13 DIAGNOSIS — Z91040 Latex allergy status: Secondary | ICD-10-CM | POA: Insufficient documentation

## 2020-01-13 DIAGNOSIS — Z853 Personal history of malignant neoplasm of breast: Secondary | ICD-10-CM | POA: Insufficient documentation

## 2020-01-13 LAB — COMPREHENSIVE METABOLIC PANEL
ALT: 16 U/L (ref 0–44)
AST: 20 U/L (ref 15–41)
Albumin: 4.6 g/dL (ref 3.5–5.0)
Alkaline Phosphatase: 78 U/L (ref 38–126)
Anion gap: 10 (ref 5–15)
BUN: 16 mg/dL (ref 8–23)
CO2: 25 mmol/L (ref 22–32)
Calcium: 9.1 mg/dL (ref 8.9–10.3)
Chloride: 104 mmol/L (ref 98–111)
Creatinine, Ser: 0.85 mg/dL (ref 0.44–1.00)
GFR calc Af Amer: >60 mL/min (ref >60)
GFR calc non Af Amer: >60 mL/min (ref >60)
Glucose, Bld: 167 mg/dL — ABNORMAL HIGH (ref 70–99)
Potassium: 3.9 mmol/L (ref 3.5–5.1)
Sodium: 139 mmol/L (ref 135–145)
Total Bilirubin: 0.7 mg/dL (ref 0.3–1.2)
Total Protein: 8.3 g/dL — ABNORMAL HIGH (ref 6.5–8.1)

## 2020-01-13 LAB — DIFFERENTIAL
Abs Immature Granulocytes: 0.03 10*3/uL (ref 0.00–0.07)
Basophils Absolute: 0.1 10*3/uL (ref 0.0–0.1)
Basophils Relative: 1 %
Eosinophils Absolute: 0.2 10*3/uL (ref 0.0–0.5)
Eosinophils Relative: 1 %
Immature Granulocytes: 0 %
Lymphocytes Relative: 28 %
Lymphs Abs: 3.8 10*3/uL (ref 0.7–4.0)
Monocytes Absolute: 0.8 10*3/uL (ref 0.1–1.0)
Monocytes Relative: 6 %
Neutro Abs: 8.8 10*3/uL — ABNORMAL HIGH (ref 1.7–7.7)
Neutrophils Relative %: 64 %

## 2020-01-13 LAB — CBC
HCT: 40.1 % (ref 36.0–46.0)
Hemoglobin: 14.3 g/dL (ref 12.0–15.0)
MCH: 27.3 pg (ref 26.0–34.0)
MCHC: 35.7 g/dL (ref 30.0–36.0)
MCV: 76.7 fL — ABNORMAL LOW (ref 80.0–100.0)
Platelets: 238 10*3/uL (ref 150–400)
RBC: 5.23 MIL/uL — ABNORMAL HIGH (ref 3.87–5.11)
RDW: 14.2 % (ref 11.5–15.5)
WBC: 13.7 10*3/uL — ABNORMAL HIGH (ref 4.0–10.5)
nRBC: 0 % (ref 0.0–0.2)

## 2020-01-13 LAB — APTT: aPTT: 28 seconds (ref 24–36)

## 2020-01-13 LAB — PROTIME-INR
INR: 0.9 (ref 0.8–1.2)
Prothrombin Time: 11.4 seconds (ref 11.4–15.2)

## 2020-01-13 MED ORDER — DROPERIDOL 2.5 MG/ML IJ SOLN: 2.5000 mg | Freq: Once | INTRAMUSCULAR | Status: DC

## 2020-01-13 MED ORDER — LACTATED RINGERS IV BOLUS
1000.0000 mL | Freq: Once | INTRAVENOUS | Status: AC
  Administered 2020-01-13: 1000 mL via INTRAVENOUS

## 2020-01-13 MED ORDER — ACETAMINOPHEN 500 MG PO TABS
1000.0000 mg | ORAL_TABLET | Freq: Once | ORAL | Status: AC
  Administered 2020-01-13: 1000 mg via ORAL
  Filled 2020-01-13: qty 2

## 2020-01-13 MED ORDER — DROPERIDOL 2.5 MG/ML IJ SOLN
2.5000 mg | Freq: Once | INTRAMUSCULAR | Status: AC
  Administered 2020-01-13: 2.5 mg via INTRAVENOUS
  Filled 2020-01-13: qty 2

## 2020-01-13 MED ORDER — IBUPROFEN 400 MG PO TABS
400.0000 mg | ORAL_TABLET | Freq: Once | ORAL | Status: AC
  Administered 2020-01-13: 400 mg via ORAL
  Filled 2020-01-13: qty 1

## 2020-01-13 MED ORDER — MECLIZINE HCL 25 MG PO TABS
25.0000 mg | ORAL_TABLET | Freq: Once | ORAL | Status: AC
  Administered 2020-01-13: 25 mg via ORAL
  Filled 2020-01-13: qty 1

## 2020-01-13 NOTE — ED Notes (Signed)
Pt reports has felt swimmy headed all day since she woke up. Pt reports was worse this am but is not as bad now.

## 2020-01-13 NOTE — ED Notes (Signed)
See triage note, pt reports headache and dizziness that started this morning. Alert and oriented. Clear speech. NAD noted

## 2020-01-13 NOTE — ED Triage Notes (Addendum)
Pt presents to ED via [facility] with c/o dizziness and "swimmy headed". Pt states symptoms started this morning. Pt states hx of same. Pt also c/o posterior HA at this time. Pt A&O x4. Pt states awoke with these symptoms, LKW, last night before going to bed.

## 2020-01-13 NOTE — Discharge Instructions (Signed)
Please take Tylenol and ibuprofen/Advil for your pain.  It is safe to take them together, or to alternate them every few hours.  Take up to 1000mg  of Tylenol at a time, up to 4 times per day.  Do not take more than 4000 mg of Tylenol in 24 hours.  For ibuprofen, take 400-600 mg, 4-5 times per day.  If you develop any worsening symptoms despite the above medications, any headaches with fevers or passing out, or strokelike symptoms, please return to the ED.

## 2020-01-13 NOTE — ED Notes (Signed)
Pt ambulatory to restroom at this time, steady gait, NAD noted 

## 2020-01-13 NOTE — ED Triage Notes (Signed)
Feeling lightheaded and nausea today. hisory of blockage.  131/74.  ekg unremark.  Sinus at 60 .  Good sat.  Weakness.

## 2020-01-13 NOTE — ED Provider Notes (Signed)
[facility] Emergency Department Provider Note ____________________________________________   First MD Initiated Contact with Patient 01/13/20 1548     (approximate)  I have reviewed the triage vital signs and the nursing notes.  HISTORY  Chief Complaint Dizziness   HPI Tonya Keith is a 59 y.o. femalewho presents to the ED for evaluation of headache and dizziness.   Chart review indicates history of HTN, HLD, GERD, CAD Patient self-reports a history of migrainous headaches.  Patient reports an occipital headache that has been present since this morning.  Patient reports waking up at her normal time, using the restroom and noticed occipital headache and "dizziness."  She reports a constant 6/10 aching to her occiput with associated bitemporal aching of 4/10 intensity, photophobia and nausea.  She reports 3 episodes of nonbloody nonbilious emesis associated with this.  When asked about her dizziness, she denies presyncope, lightheadedness, vertigo or spinning sensation.  She discusses her occipital headache and pain when discussing her dizziness.  She did not take any medications prior to arrival to assist with this pain.  Patient denies any recent illnesses, fevers, syncope, head trauma, vision changes, chest pain, shortness of breath, abdominal pain or diarrhea.  No dysuria.    Past Medical History:  Diagnosis Date  . Anginal pain ([place])   . Breast cancer ([place]) 2008   chemo mastectomy left  . Cancer [facility])    breast  . Coronary artery disease   . History of left heart catheterization    w/ pci to RCA  . Hypertension   . Personal history of chemotherapy     Patient Active Problem List   Diagnosis Date Noted  . Hypothermia 12/03/2017  . Bilateral lower extremity pain 11/17/2017  . Bilateral lower extremity edema 11/17/2017  . Tobacco abuse 11/17/2017  . Essential hypertension 11/17/2017  . Hyperlipidemia 11/17/2017    Past Surgical History:   Procedure Laterality Date  . ABDOMINAL HYSTERECTOMY    . LEFT HEART CATH AND CORONARY ANGIOGRAPHY Left 09/07/2019   Procedure: LEFT HEART CATH AND CORONARY ANGIOGRAPHY;  Surgeon: Yolonda Kida, MD;  Location: [facility] [facility];  Service: Cardiovascular;  Laterality: Left;  Marland Kitchen MASTECTOMY Left 2008    Prior to Admission medications   Medication Sig Start Date End Date Taking? Authorizing Provider  albuterol (VENTOLIN HFA) 108 (90 Base) MCG/ACT inhaler Inhale 2 puffs into the lungs every 6 (six) hours as needed for wheezing or shortness of breath.    [provider]  amLODipine (NORVASC) 10 MG tablet Take 10 mg by mouth daily.  10/27/17   [provider]  aspirin EC 81 MG tablet Take 81 mg by mouth daily.     [provider]  cetirizine (ZYRTEC) 10 MG tablet Take 10 mg by mouth daily.     [provider]  cloNIDine (CATAPRES) 0.2 MG tablet Take 0.2 mg by mouth 2 (two) times daily.  10/27/17   [provider]  escitalopram (LEXAPRO) 20 MG tablet Take 20 mg by mouth daily.    [provider]  ibuprofen (ADVIL) 200 MG tablet Take 200-400 mg by mouth every 6 (six) hours as needed for headache or moderate pain.    [provider]  ipratropium (ATROVENT) 0.03 % nasal spray Place 2 sprays into both nostrils 2 (two) times daily.    [provider]  isosorbide mononitrate (IMDUR) 30 MG 24 hr tablet Take 30 mg by mouth daily.    [provider]  lovastatin (  MEVACOR) 40 MG tablet Take 40 mg by mouth daily.     [provider]  metoprolol succinate (TOPROL-XL) 100 MG 24 hr tablet Take 100 mg by mouth daily.  10/27/17   [provider]  nicotine (NICODERM CQ - DOSED IN MG/24 HOURS) 21 mg/24hr patch Place 21 mg onto the skin daily.    [provider]  omeprazole (PRILOSEC) 20 MG capsule Take 20 mg by mouth daily.    [provider]  ramelteon (ROZEREM) 8 MG tablet Take 8 mg by mouth at bedtime.  08/25/19   [provider]  Vitamin D, Ergocalciferol, (DRISDOL) 1.25 MG (50000 UNIT) CAPS capsule Take 50,000 Units by mouth every Wednesday.    [provider]    Allergies Pollen extract and Latex  Family History  Problem Relation Age of Onset  . Breast cancer Maternal Aunt   . Heart disease Father   . Hypertension Father   . Cancer Sister     Social History Social History   Tobacco Use  . Smoking status: Current Every Day Smoker    Packs/day: 0.50    Types: Cigarettes  . Smokeless tobacco: Never Used  Substance Use Topics  . Alcohol use: Yes    Comment: occassional   . Drug use: Yes    Types: Marijuana    Comment: "every once in a while"    Review of Systems  Constitutional: No fever/chills Eyes: No visual changes. ENT: No sore throat. Cardiovascular: Denies chest pain. Respiratory: Denies shortness of breath. Gastrointestinal: No abdominal pain.    No diarrhea.  No constipation.  Positive for nausea and vomiting. Genitourinary: Negative for dysuria. Musculoskeletal: Negative for back pain. Skin: Negative for rash. Neurological: Negative for , focal weakness or numbness.  Positive for headaches.   ____________________________________________   PHYSICAL EXAM:  VITAL SIGNS: Vitals:   01/13/20 1510 01/13/20 1802  BP: 112/69 121/71  Pulse: 78 66  Resp: 18 20  Temp: 97.6 F (36.4 C) 97.9 F (36.6 C)  SpO2: 98% 97%      Constitutional: Alert and oriented. Well appearing and in no acute distress.  Covered initiate and laying on her left side when I enter the room.  She quickly removes the sheet and is conversational full sentences when I enter the room.  She is able to get up out of the bed independently and ambulate with a normal gait without assistance. Eyes: Conjunctivae are normal. PERRL. EOMI. Head: Atraumatic. Nose: No congestion/rhinnorhea. Mouth/Throat: Mucous membranes are moist.  Oropharynx non-erythematous. Neck: No stridor.  No cervical spine tenderness to palpation. Cardiovascular: Normal rate, regular rhythm. Grossly normal heart sounds.  Good peripheral circulation. Respiratory: Normal respiratory effort.  No retractions. Lungs CTAB. Gastrointestinal: Soft , nondistended, nontender to palpation. No abdominal bruits. No CVA tenderness. Musculoskeletal: No lower extremity tenderness nor edema.  No joint effusions. No signs of acute trauma. Neurologic:  Normal speech and language. No gross focal neurologic deficits are appreciated. No gait instability noted. Cranial nerves II through XII intact 5/5 strength and sensation in all 4 extremities Skin:  Skin is warm, dry and intact. No rash noted. Psychiatric: Mood and affect are normal. Speech and behavior are normal.  ____________________________________________   LABS (all labs ordered are listed, but only abnormal results are displayed)  Labs Reviewed  CBC - Abnormal; Notable for the following components:      Result Value   WBC 13.7 (*)    RBC 5.23 (*)    MCV 76.7 (*)  All other components within normal limits  DIFFERENTIAL - Abnormal; Notable for the following components:   Neutro Abs 8.8 (*)    All other components within normal limits  COMPREHENSIVE METABOLIC PANEL - Abnormal; Notable for the following components:   Glucose, Bld 167 (*)    Total Protein 8.3 (*)    All other components within normal limits  PROTIME-INR  APTT  I-STAT CREATININE, ED  CBG MONITORING, ED   ____________________________________________  12 Lead EKG  Sinus rhythm, rate of 61 bpm.  Normal axis and intervals.  No evidence of acute ischemia. ____________________________________________  RADIOLOGY  ED MD interpretation: CT head reviewed and without evidence of acute intracranial pathology  Official radiology report(s): CT HEAD WO CONTRAST  Result Date: 01/13/2020 CLINICAL DATA:  59 year old female with history of dizziness. Recent posterior headache. EXAM: CT  HEAD WITHOUT CONTRAST TECHNIQUE: Contiguous axial images were obtained from the base of the skull through the vertex without intravenous contrast. COMPARISON:  Head CT 12/03/2017. FINDINGS: Brain: No evidence of acute infarction, hemorrhage, hydrocephalus, extra-axial collection or mass lesion/mass effect. Vascular: No hyperdense vessel or unexpected calcification. Skull: Normal. Negative for fracture or focal lesion. Sinuses/Orbits: No acute finding. Other: None. IMPRESSION: 1. No acute intracranial abnormalities. The appearance of the brain is normal. Electronically Signed   By: Vinnie Langton M.D.   On: 01/13/2020 12:05    ____________________________________________   PROCEDURES and INTERVENTIONS  Procedure(s) performed (including Critical Care):  Procedures  Medications  meclizine (ANTIVERT) tablet 25 mg (25 mg Oral Given 01/13/20 1611)  acetaminophen (TYLENOL) tablet 1,000 mg (1,000 mg Oral Given 01/13/20 1658)  ibuprofen (ADVIL) tablet 400 mg (400 mg Oral Given 01/13/20 1658)  droperidol (INAPSINE) 2.5 MG/ML injection 2.5 mg (2.5 mg Intravenous Given 01/13/20 1659)  lactated ringers bolus 1,000 mL (0 mLs Intravenous Stopped 01/13/20 1803)    ____________________________________________   MDM / ED COURSE  59 year old woman with history of migrainous headaches, presenting with headache and dizziness, most consistent with migraine and amenable to outpatient management.  Normal vital signs on room air.  Reassuring examination without evidence of neurologic deficits, distress, trauma, meningismus or central pathology.  Patient is well-appearing, ambulatory and in no distress.  She is reporting a occipital/bitemporal aching headache with photophobia and nausea consistent with her previous migraines.  Blood work reassuring without evidence of significant acute derangements.  Of note, she does have a mild leukocytosis, but review of previous lab values indicates she typically has a small  leukocytosis with WBC between 11-13.  CT imaging of her head obtained and has no evidence of mass, ICH or CVA as the source of her symptoms.  Patient reports resolution of symptoms after single migraine cocktail.  She continues to look well and has a normal neurologic examination upon reassessment.  I see no evidence of additional pathology to warrant MRI or hospital admission.  We discussed outpatient management of headaches, and return precautions for the ED.  Patient medically stable for discharge home.  Clinical Course as of Jan 13 1939  Fri Jan 13, 2020  1738 Reassessed after migraine cocktail.  Patient reports resolution of symptoms.  We discussed outpatient management of headaches.  We discussed return precautions for the ED.  Patient expresses understanding and agreement.  Requesting discharge.   [DS]    Clinical Course User Index [DS] Vladimir Crofts, MD     ____________________________________________   FINAL CLINICAL IMPRESSION(S) / ED DIAGNOSES  Final diagnoses:  Migraine without aura and without status migrainosus, not intractable  Dizziness     ED Discharge Orders    None       Keigen Caddell Tamala Julian   Note:  This document was prepared using Dragon voice recognition software and may include unintentional dictation errors.   Vladimir Crofts, MD 01/13/20 [phone]

## 2020-03-01 ENCOUNTER — Other Ambulatory Visit: Payer: Self-pay | Admitting: Obstetrics and Gynecology

## 2020-03-01 DIAGNOSIS — Z1231 Encounter for screening mammogram for malignant neoplasm of breast: Secondary | ICD-10-CM

## 2020-03-13 ENCOUNTER — Other Ambulatory Visit (HOSPITAL_COMMUNITY): Payer: Self-pay | Admitting: Neurology

## 2020-03-13 ENCOUNTER — Other Ambulatory Visit: Payer: Self-pay | Admitting: Neurology

## 2020-03-13 DIAGNOSIS — R9089 Other abnormal findings on diagnostic imaging of central nervous system: Secondary | ICD-10-CM

## 2020-03-13 DIAGNOSIS — G501 Atypical facial pain: Secondary | ICD-10-CM

## 2020-03-30 ENCOUNTER — Other Ambulatory Visit: Payer: Self-pay

## 2020-03-30 ENCOUNTER — Ambulatory Visit
Admission: RE | Admit: 2020-03-30 | Discharge: 2020-03-30 | Disposition: A | Discharge status: Home / Self Care | Payer: Medicare Other | Source: Ambulatory Visit | Attending: Neurology | Admitting: Neurology

## 2020-03-30 DIAGNOSIS — G501 Atypical facial pain: Secondary | ICD-10-CM

## 2020-03-30 DIAGNOSIS — R9089 Other abnormal findings on diagnostic imaging of central nervous system: Secondary | ICD-10-CM | POA: Insufficient documentation

## 2020-03-30 MED ORDER — GADOBUTROL 1 MMOL/ML IV SOLN
5.0000 mL | Freq: Once | INTRAVENOUS | Status: AC | PRN
  Administered 2020-03-30: 5 mL via INTRAVENOUS

## 2020-06-22 ENCOUNTER — Other Ambulatory Visit: Payer: Self-pay

## 2020-06-22 ENCOUNTER — Ambulatory Visit
Admission: RE | Admit: 2020-06-22 | Discharge: 2020-06-22 | Disposition: A | Discharge status: Home / Self Care | Payer: Medicare Other | Source: Ambulatory Visit | Attending: Obstetrics and Gynecology | Admitting: Obstetrics and Gynecology

## 2020-06-22 DIAGNOSIS — Z1231 Encounter for screening mammogram for malignant neoplasm of breast: Secondary | ICD-10-CM | POA: Insufficient documentation

## 2020-09-18 ENCOUNTER — Other Ambulatory Visit: Payer: Self-pay | Admitting: Family Medicine

## 2020-09-18 DIAGNOSIS — Z780 Asymptomatic menopausal state: Secondary | ICD-10-CM

## 2020-09-24 ENCOUNTER — Other Ambulatory Visit: Payer: Self-pay

## 2020-09-24 ENCOUNTER — Encounter: Payer: Self-pay | Admitting: Otolaryngology

## 2020-09-24 NOTE — Anesthesia Preprocedure Evaluation (Addendum)
Anesthesia Evaluation  Patient identified by MRN, date of birth, ID band Patient awake    Reviewed: Allergy & Precautions, NPO status , Patient's Chart, lab work & pertinent test results, reviewed documented beta blocker date and time   History of Anesthesia Complications Negative for: history of anesthetic complications  Airway Mallampati: III  TM Distance: >3 FB Neck ROM: Full    Dental  (+) Upper Dentures,    Pulmonary COPD, Current Smoker and Patient abstained from smoking.,    breath sounds clear to auscultation       Cardiovascular hypertension, + angina (Stable) + CAD, + Past MI, + Cardiac Stents and + DOE   Rhythm:Regular Rate:Normal  Cardiac cath 08/2019: Normal left ventricular function Single-vessel coronary disease of around 70% proximal circumflex Widely patent mid RCA stent Recommend medical therapy for now  09/03/2020: 72 hour holter monitor normal   Neuro/Psych    GI/Hepatic neg GERD  ,  Endo/Other    Renal/GU      Musculoskeletal   Abdominal   Peds  Hematology  (+) anemia ,   Anesthesia Other Findings Cardiology visit 06/18/20 Plan  1 coronary artery disease previous myocardial infarction with stable angina continue current medical therapy including Imdur and metoprolol 2 history of myocardial infarction with known coronary disease stable anginal continue current medical therapy including statin beta-blocker aspirin 3 smoking by history advised patient refrain from tobacco abuse 4 GERD recommend omeprazole therapy for reflux type symptoms 5 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 continue inhalers as necessary follow-up with primary physician 6 hypertension reasonably controlled still clonidine Imdur metoprolol amlodipine 7 history of breast cancer in the past still followed by oncology 8 have the patient follow-up in 4 months    Reproductive/Obstetrics                            Anesthesia Physical Anesthesia Plan  ASA: III  Anesthesia Plan: General   Post-op Pain Management:    Induction: Intravenous  PONV Risk Score and Plan: 3 and Treatment may vary due to age or medical condition and Ondansetron  Airway Management Planned: LMA  Additional Equipment:   Intra-op Plan:   Post-operative Plan: Extubation in OR  Informed Consent: I have reviewed the patients History and Physical, chart, labs and discussed the procedure including the risks, benefits and alternatives for the proposed anesthesia with the patient or authorized representative who has indicated his/her understanding and acceptance.       Plan Discussed with: CRNA and Anesthesiologist  Anesthesia Plan Comments:         Anesthesia Quick Evaluation

## 2020-10-01 NOTE — Discharge Instructions (Signed)

## 2020-10-02 ENCOUNTER — Other Ambulatory Visit: Payer: Self-pay

## 2020-10-02 ENCOUNTER — Encounter: Payer: Self-pay | Admitting: Otolaryngology

## 2020-10-02 ENCOUNTER — Ambulatory Visit: Payer: Medicare Other | Admitting: Anesthesiology

## 2020-10-02 ENCOUNTER — Ambulatory Visit
Admission: RE | Admit: 2020-10-02 | Discharge: 2020-10-02 | Disposition: A | Discharge status: Home / Self Care | Payer: Medicare Other | Source: Ambulatory Visit | Attending: Otolaryngology | Admitting: Otolaryngology

## 2020-10-02 ENCOUNTER — Encounter
Admission: RE | Disposition: A | Discharge status: Home / Self Care | Payer: Self-pay | Source: Ambulatory Visit | Attending: Otolaryngology

## 2020-10-02 DIAGNOSIS — F17200 Nicotine dependence, unspecified, uncomplicated: Secondary | ICD-10-CM | POA: Insufficient documentation

## 2020-10-02 DIAGNOSIS — Z7982 Long term (current) use of aspirin: Secondary | ICD-10-CM | POA: Insufficient documentation

## 2020-10-02 DIAGNOSIS — Z791 Long term (current) use of non-steroidal anti-inflammatories (NSAID): Secondary | ICD-10-CM | POA: Insufficient documentation

## 2020-10-02 DIAGNOSIS — Z79899 Other long term (current) drug therapy: Secondary | ICD-10-CM | POA: Insufficient documentation

## 2020-10-02 DIAGNOSIS — K130 Diseases of lips: Secondary | ICD-10-CM | POA: Insufficient documentation

## 2020-10-02 DIAGNOSIS — C003 Malignant neoplasm of upper lip, inner aspect: Secondary | ICD-10-CM | POA: Diagnosis not present

## 2020-10-02 DIAGNOSIS — Z853 Personal history of malignant neoplasm of breast: Secondary | ICD-10-CM | POA: Insufficient documentation

## 2020-10-02 HISTORY — PX: MINOR EXCISION OF ORAL LESION: SHX6466

## 2020-10-02 HISTORY — DX: Personal history of other diseases of the nervous system and sense organs: Z86.69

## 2020-10-02 HISTORY — DX: Anemia, unspecified: D64.9

## 2020-10-02 HISTORY — DX: Presence of dental prosthetic device (complete) (partial): Z97.2

## 2020-10-02 HISTORY — DX: Chronic obstructive pulmonary disease, unspecified: J44.9

## 2020-10-02 SURGERY — MINOR EXCISION OF ORAL LESION
Anesthesia: General

## 2020-10-02 MED ORDER — PROMETHAZINE HCL 25 MG/ML IJ SOLN: 6.2500 mg | INTRAMUSCULAR | Status: DC | PRN

## 2020-10-02 MED ORDER — FENTANYL CITRATE (PF) 100 MCG/2ML IJ SOLN: 25.0000 ug | INTRAMUSCULAR | Status: DC | PRN

## 2020-10-02 MED ORDER — LIDOCAINE HCL (CARDIAC) PF 100 MG/5ML IV SOSY
PREFILLED_SYRINGE | INTRAVENOUS | Status: DC | PRN
  Administered 2020-10-02: 40 mg via INTRATRACHEAL

## 2020-10-02 MED ORDER — FENTANYL CITRATE (PF) 100 MCG/2ML IJ SOLN
INTRAMUSCULAR | Status: DC | PRN
  Administered 2020-10-02: 25 ug via INTRAVENOUS

## 2020-10-02 MED ORDER — GLYCOPYRROLATE 0.2 MG/ML IJ SOLN
INTRAMUSCULAR | Status: DC | PRN
  Administered 2020-10-02: .1 mg via INTRAVENOUS

## 2020-10-02 MED ORDER — EPHEDRINE SULFATE 50 MG/ML IJ SOLN
INTRAMUSCULAR | Status: DC | PRN
  Administered 2020-10-02 (×3): 5 mg via INTRAVENOUS

## 2020-10-02 MED ORDER — LACTATED RINGERS IV SOLN: INTRAVENOUS | Status: DC

## 2020-10-02 MED ORDER — ONDANSETRON HCL 4 MG/2ML IJ SOLN
INTRAMUSCULAR | Status: DC | PRN
  Administered 2020-10-02: 4 mg via INTRAVENOUS

## 2020-10-02 MED ORDER — LIDOCAINE-EPINEPHRINE (PF) 1 %-1:200000 IJ SOLN
INTRAMUSCULAR | Status: DC | PRN
  Administered 2020-10-02: 1 mL

## 2020-10-02 MED ORDER — PROPOFOL 10 MG/ML IV BOLUS
INTRAVENOUS | Status: DC | PRN
  Administered 2020-10-02: 80 mg via INTRAVENOUS

## 2020-10-02 MED ORDER — MIDAZOLAM HCL 5 MG/5ML IJ SOLN
INTRAMUSCULAR | Status: DC | PRN
  Administered 2020-10-02: 1 mg via INTRAVENOUS

## 2020-10-02 MED ORDER — OXYCODONE HCL 5 MG PO TABS: 5.0000 mg | ORAL_TABLET | Freq: Once | ORAL | Status: DC | PRN

## 2020-10-02 MED ORDER — OXYCODONE HCL 5 MG/5ML PO SOLN
5.0000 mg | Freq: Once | ORAL | Status: DC | PRN
Start: 2020-10-02 — End: 2020-10-02

## 2020-10-02 MED ORDER — MEPERIDINE HCL 25 MG/ML IJ SOLN: 6.2500 mg | INTRAMUSCULAR | Status: DC | PRN

## 2020-10-02 MED ORDER — DEXAMETHASONE SODIUM PHOSPHATE 4 MG/ML IJ SOLN
INTRAMUSCULAR | Status: DC | PRN
  Administered 2020-10-02: 8 mg via INTRAVENOUS

## 2020-10-02 MED ORDER — AMOXICILLIN 875 MG PO TABS: 875.0000 mg | ORAL_TABLET | Freq: Two times a day (BID) | ORAL | 0 refills | Status: DC

## 2020-10-02 SURGICAL SUPPLY — 16 items
BLADE SURG 15 STRL LF DISP TIS (BLADE) ×1 IMPLANT
BLADE SURG 15 STRL SS (BLADE) ×2
CANISTER SUCT 1200ML W/VALVE (MISCELLANEOUS) ×2 IMPLANT
COAG SUCT 10F 3.5MM HAND CTRL (MISCELLANEOUS) ×2 IMPLANT
ELECT CAUTERY BLADE TIP 2.5 (TIP) ×2
ELECTRODE CAUTERY BLDE TIP 2.5 (TIP) ×1 IMPLANT
GAUZE SPONGE 4X4 12PLY STRL (GAUZE/BANDAGES/DRESSINGS) ×2 IMPLANT
GLOVE SURG ENC MOIS LTX SZ7.5 (GLOVE) ×2 IMPLANT
GOWN STRL REUS W/ TWL LRG LVL3 (GOWN DISPOSABLE) ×2 IMPLANT
GOWN STRL REUS W/TWL LRG LVL3 (GOWN DISPOSABLE) ×4
NS IRRIG 500ML POUR BTL (IV SOLUTION) ×2 IMPLANT
PACK ENT CUSTOM (PACKS) ×2 IMPLANT
SUCTION FRAZIER HANDLE 10FR (MISCELLANEOUS) ×1
SUCTION TUBE FRAZIER 10FR DISP (MISCELLANEOUS) ×1 IMPLANT
SUT CHROMIC 5 0 RB 1 27 (SUTURE) ×2 IMPLANT
SYR EAR/ULCER 2OZ (SYRINGE) ×2 IMPLANT

## 2020-10-02 NOTE — Transfer of Care (Signed)
Immediate Anesthesia Transfer of Care Note  Patient: Tonya Keith  Procedure(s) Performed: excision of right upper lip mass, intraoral (N/A )  Patient Location: PACU  Anesthesia Type: General  Level of Consciousness: awake, alert  and patient cooperative  Airway and Oxygen Therapy: Patient Spontanous Breathing and Patient connected to supplemental oxygen  Post-op Assessment: Post-op Vital signs reviewed, Patient's Cardiovascular Status Stable, Respiratory Function Stable, Patent Airway and No signs of Nausea or vomiting  Post-op Vital Signs: Reviewed and stable  Complications: No complications documented.

## 2020-10-02 NOTE — Op Note (Signed)
10/02/2020  9:58 AM    Cindy Hazy  [id number]   Pre-Op Diagnosis:  tender mass inside right upper lip, embedded foreign body  Post-op Diagnosis: SAME  Procedure: Excision of submucosal right upper lip mass, removal of embedded foreign body, with closure  Surgeon: Riley Nearing., MD  Anesthesia:  General endotracheal  EBL:  Less than 25 cc  Complications:  None  Findings: 1 cm submucosal mass consistent with lymph node. Small embedded piece of calcified material under the gingiva right upper premaxilla area.   Procedure: The patient was taken to the Operating Room and placed in the supine position.  After induction of general endotracheal anesthesia, the patient was draped in the usual fashion with the eyes protected.  The mucosa over the mass in the right upper lip near the gingivo-buccal sulcus was injected with 1% lidocaine with epinephrine 1:200,000. Tenotomies were then used to dissect around the submucosal mass, which appeared to be a lymph node. The mass was resected completely and sent as specimen. The wound bed was then inspected for the foreign material that had been noted on CT. This was difficult to locate, but eventually was found in the premaxillary soft tissues immediately adjacent to the gingival ride, and appeared to be calcified material, possibly a retained piece of bone or tooth from prior dental extraction. This was removed and sent as a separate specimen.   The wound was irrigated with saline and closed with 5-0 chromic gut suture in an interrupted fashion. Bleeding was minimal. The patient was then returned to the anesthesiologist for awakening, and was taken to the Recovery Room in stable condition.  Cultures:  None.  Specimens:  right upper lip submucosal mass, foreign material   Disposition:   PACU to home  Plan: Soft, bland diet and push fluids. Take Amoxil as prescribed. Tylenol and ice as needed for pain/swelling Follow-up in 2 weeks.  Riley Nearing 10/02/2020 9:58 AM

## 2020-10-02 NOTE — H&P (Signed)
History and physical reviewed and will be scanned in later. No change in medical status reported by the patient or family, appears stable for surgery. All questions regarding the procedure answered, and patient (or family if a child) expressed understanding of the procedure. ? ?Tonya Keith S Yanissa Michalsky ?@TODAY@ ?

## 2020-10-02 NOTE — Anesthesia Postprocedure Evaluation (Signed)
Anesthesia Post Note  Patient: Tonya Keith  Procedure(s) Performed: excision of right upper lip mass, intraoral (N/A )     Patient location during evaluation: PACU Anesthesia Type: General Level of consciousness: awake and alert Pain management: pain level controlled Vital Signs Assessment: post-procedure vital signs reviewed and stable Respiratory status: spontaneous breathing, nonlabored ventilation, respiratory function stable and patient connected to nasal cannula oxygen Cardiovascular status: blood pressure returned to baseline and stable Postop Assessment: no apparent nausea or vomiting Anesthetic complications: no   No complications documented.  Yona Stansbury A  Ames Hoban

## 2020-10-02 NOTE — Anesthesia Procedure Notes (Signed)
Procedure Name: LMA Insertion Date/Time: 10/02/2020 8:58 AM Performed by: Mayme Genta, CRNA Pre-anesthesia Checklist: Patient identified, Emergency Drugs available, Suction available, Timeout performed and Patient being monitored Patient Re-evaluated:Patient Re-evaluated prior to induction Oxygen Delivery Method: Circle system utilized Preoxygenation: Pre-oxygenation with 100% oxygen Induction Type: IV induction LMA: LMA inserted LMA Size: 3.0 Number of attempts: 1 Placement Confirmation: positive ETCO2 and breath sounds checked- equal and bilateral Tube secured with: Tape

## 2020-10-03 ENCOUNTER — Encounter: Payer: Self-pay | Admitting: Otolaryngology

## 2020-11-09 ENCOUNTER — Other Ambulatory Visit: Payer: Self-pay | Admitting: Pathology

## 2020-11-09 LAB — SURGICAL PATHOLOGY

## 2020-11-15 ENCOUNTER — Other Ambulatory Visit: Payer: Medicare Other

## 2020-11-16 NOTE — Progress Notes (Signed)
Tumor Board Documentation  Tonya Keith was presented by Dr Richardson Landry at our Tumor Board on 11/15/2020, which included representatives from radiation oncology, internal medicine, navigation, pathology, radiology, medical oncology, surgical, pharmacy, genetics, research, palliative care, pulmonology.  Tonya Keith currently presents as an external consult, for [place], for new positive pathology with history of the following treatments: surgical intervention(s).  Additionally, we reviewed previous medical and familial history, history of present illness, and recent lab results along with all available histopathologic and imaging studies. The tumor board considered available treatment options and made the following recommendations: Active surveillance Can offer Targeted Therapy at later date as indicated  The following procedures/referrals were also placed: No orders of the defined types were placed in this encounter.   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  Tumor board is a meeting of clinicians from various specialty areas who evaluate and discuss patients for whom a multidisciplinary approach is being considered. Final determinations in the plan of care are those of the provider(s). The responsibility for follow up of recommendations given during tumor board is that of the provider.   Today's extended care, comprehensive team conference, Tonya Keith was not present for the discussion and was not examined.   Multidisciplinary Tumor Board is a multidisciplinary case peer review process.  Decisions discussed in the Multidisciplinary Tumor Board reflect the opinions of the specialists present at the conference without having examined the patient.  Ultimately, treatment and diagnostic decisions rest with the primary provider(s)  and the patient.

## 2020-12-03 NOTE — Anesthesia Preprocedure Evaluation (Addendum)
Anesthesia Evaluation  Patient identified by MRN, date of birth, ID band Patient awake    Reviewed: Allergy & Precautions, NPO status , Patient's Chart, lab work & pertinent test results  History of Anesthesia Complications Negative for: history of anesthetic complications  Airway Mallampati: III   Neck ROM: Full    Dental   Missing all upper teeth, lower right tooth chipped:   Pulmonary COPD, Current Smoker (3 cigarettes per day, attempting to quit)Patient did not abstain from smoking.,    Pulmonary exam normal breath sounds clear to auscultation       Cardiovascular hypertension, + CAD (s/p MI and stents)  Normal cardiovascular exam Rhythm:Regular Rate:Normal     Neuro/Psych  Headaches, Marijuana use, last intake 12/03/20    GI/Hepatic GERD  ,  Endo/Other  negative endocrine ROS  Renal/GU negative Renal ROS     Musculoskeletal   Abdominal   Peds  Hematology  (+) Blood dyscrasia, anemia , Breast CA   Anesthesia Other Findings Cardiology note 10/17/20:  Plan   1. Chronic dyspnea with worsening, likely due to underlying COPD and current tobacco use, fairly stable -We will schedule echocardiogram for further evaluation of LV function EF. -Recommend continuing inhaler therapy.  2. Angina at rest, intermittent, recurrent -Most recent cardiac catheterization from 08/2019 unremarkable for proximal circumflex lesion but intervention was deferred and medical therapy was recommended -Patient unable to tolerate Imdur at this time. -We will schedule ETT/Myoview for further evaluation of possible myocardial ischemia.  3. CAD with stable angina, stable, single-vessel coronary artery disease noted in small circumflex confirmed by cardiac cath -We will continue continue aspirin, metoprolol and lovastatin therapy. -We will proceed with scheduling noninvasive cardiac diagnostic testing.  4. MI s/p PCI with DES stent to RCA  (2010) stent confirmed to be widely patent by Cardiac Cath -We will continue aspirin therapy.  5. Murmur, chronic, stable -previous Echocardiogram reveals mild to moderate MR. -We we will proceed with repeat echocardiogram for further evaluation of LV function and EF.  6. Dizziness, recurrent, stable -Most recent Holter monitor was benign. -We will obtain orthostatic blood pressure on today. -We will obtain CMP on today for further evaluation of electrolytes. -Education reinforced to the patient on the importance of finding a safe place to rest in the event that dizziness occurs in order to prevent any falls. She verbalized understanding of all information. -Recommend maintaining adequate water intake daily to prevent dehydration and eating at least 3 meals daily with snacks in between to prevent hypoglycemia.  7. Migraines, resolved at this time with the holding of isosorbide therapy -We will discontinue isosorbide at this time.  -Patient previously referred to neurology.  8. Hypertension, reasonably controlled, patient normotensive today.  -We will continue management with amlodipine, clonidine and metoprolol therapy.  -Recommend following DASH diet and monitoring blood pressure daily at home.   9. Hypercholesterolemia, reasonably controlled -We will continue management with lovastatin -Recommend yearly lipid profile with regularly scheduled labs via PCP.  10. COPD, reasonably stable -Management per pulmonary with Flonase and Spiriva therapy; recommend continuing. -Recommend following-up with pulmonology as scheduled  11. Mechanical fall per patient report, fairly stable -Recommend following fall precautions.  12. Bilateral ankle edema per patient report, intermittent, fairly stable  --Recommend decreasing sodium intake, elevating the BLE and utilizing compression socks.  13. Hyperkalemia, potassium level from 07/18/2020 was elevated at 5.6, patient remains asymptomatic -We will  schedule repeat CMP on today to evaluate potassium levels.  Return in about 4 months (around  02/17/2021). Discussed return precautions with patient for acute and chronic cardiovascular issues.   Cardiovascular Studies:  72 hour Holter: (09/03/2020) Indication is dizziness vertigo Patient wore the Holter from August 23 January 19, 2020 This is a 3-day Holter  Total beats 179,094 Minimum rate 53 maximum 87 average 65 Rare PACs Rare PVCs No runs No pauses No high-grade block No ST segment changes No diary submitted No evidence of atrial fibrillation  Conclusion Benign Holter  Cardiac Catheterization: (08/2019) Narrative  Prox Cx lesion is 70% stenosed.   Widely patent right coronary artery stent   No significant disease in LAD   Preserved left ventricular function around 55%   Prox RCA to Mid RCA lesion is 10% stenosed.   Conclusion  Normal left ventricular function  Single-vessel coronary disease of around 70% proximal circumflex  Widely patent mid RCA stent  Recommend medical therapy for now  Echocardiogram: (11/2018) INTERPRETATION NORMAL LEFT VENTRICULAR SYSTOLIC FUNCTION NORMAL RIGHT VENTRICULAR SYSTOLIC FUNCTION MILD VALVULAR REGURGITATION (See above) NO VALVULAR STENOSIS MILD to MODERATE MR MILD TR, PR EF 50%  NM Myocardial perfusion SPECT multiple (stress and rest): (11/2018) IMPRESSION: Normal myocardial perfusion scan no evidence of stress-induced myocardial ischemia ejection fraction 57% conclusion negative scan  Reproductive/Obstetrics                            Anesthesia Physical Anesthesia Plan  ASA: 3  Anesthesia Plan: General   Post-op Pain Management:    Induction: Intravenous  PONV Risk Score and Plan: 2 and Ondansetron, Dexamethasone and Treatment may vary due to age or medical condition  Airway Management Planned: LMA  Additional Equipment:   Intra-op Plan:   Post-operative Plan: Extubation in  OR  Informed Consent: I have reviewed the patients History and Physical, chart, labs and discussed the procedure including the risks, benefits and alternatives for the proposed anesthesia with the patient or authorized representative who has indicated his/her understanding and acceptance.       Plan Discussed with: CRNA  Anesthesia Plan Comments:        Anesthesia Quick Evaluation

## 2020-12-04 ENCOUNTER — Other Ambulatory Visit: Payer: Self-pay

## 2020-12-04 ENCOUNTER — Encounter: Payer: Self-pay | Admitting: Otolaryngology

## 2020-12-04 ENCOUNTER — Ambulatory Visit: Payer: Medicare Other | Admitting: Anesthesiology

## 2020-12-04 ENCOUNTER — Ambulatory Visit
Admission: RE | Admit: 2020-12-04 | Discharge: 2020-12-04 | Disposition: A | Discharge status: Home / Self Care | Payer: Medicare Other | Attending: Otolaryngology | Admitting: Otolaryngology

## 2020-12-04 ENCOUNTER — Encounter
Admission: RE | Disposition: A | Discharge status: Home / Self Care | Payer: Self-pay | Source: Home / Self Care | Attending: Otolaryngology

## 2020-12-04 DIAGNOSIS — I2510 Atherosclerotic heart disease of native coronary artery without angina pectoris: Secondary | ICD-10-CM | POA: Insufficient documentation

## 2020-12-04 DIAGNOSIS — Z79899 Other long term (current) drug therapy: Secondary | ICD-10-CM | POA: Diagnosis not present

## 2020-12-04 DIAGNOSIS — F17210 Nicotine dependence, cigarettes, uncomplicated: Secondary | ICD-10-CM | POA: Insufficient documentation

## 2020-12-04 DIAGNOSIS — J449 Chronic obstructive pulmonary disease, unspecified: Secondary | ICD-10-CM | POA: Diagnosis not present

## 2020-12-04 DIAGNOSIS — C003 Malignant neoplasm of upper lip, inner aspect: Secondary | ICD-10-CM | POA: Diagnosis present

## 2020-12-04 HISTORY — PX: LESION EXCISION WITH COMPLEX REPAIR: SHX6700

## 2020-12-04 SURGERY — LESION EXCISION WITH COMPLEX REPAIR
Anesthesia: General | Site: Mouth | Laterality: Right

## 2020-12-04 MED ORDER — DEXAMETHASONE SODIUM PHOSPHATE 4 MG/ML IJ SOLN
INTRAMUSCULAR | Status: DC | PRN
  Administered 2020-12-04: 8 mg via INTRAVENOUS

## 2020-12-04 MED ORDER — LIDOCAINE-EPINEPHRINE 1 %-1:100000 IJ SOLN
INTRAMUSCULAR | Status: DC | PRN
  Administered 2020-12-04: 2 mL

## 2020-12-04 MED ORDER — MIDAZOLAM HCL 5 MG/5ML IJ SOLN
INTRAMUSCULAR | Status: DC | PRN
  Administered 2020-12-04: 1 mg via INTRAVENOUS

## 2020-12-04 MED ORDER — LACTATED RINGERS IV SOLN: INTRAVENOUS | Status: DC

## 2020-12-04 MED ORDER — GLYCOPYRROLATE 0.2 MG/ML IJ SOLN
INTRAMUSCULAR | Status: DC | PRN
  Administered 2020-12-04: .1 mg via INTRAVENOUS

## 2020-12-04 MED ORDER — LIDOCAINE HCL (CARDIAC) PF 100 MG/5ML IV SOSY
PREFILLED_SYRINGE | INTRAVENOUS | Status: DC | PRN
  Administered 2020-12-04: 20 mg via INTRATRACHEAL

## 2020-12-04 MED ORDER — PROPOFOL 10 MG/ML IV BOLUS
INTRAVENOUS | Status: DC | PRN
  Administered 2020-12-04: 80 mg via INTRAVENOUS

## 2020-12-04 MED ORDER — AMOXICILLIN-POT CLAVULANATE 875-125 MG PO TABS: 1.0000 | ORAL_TABLET | Freq: Two times a day (BID) | ORAL | 0 refills | Status: DC

## 2020-12-04 MED ORDER — ONDANSETRON HCL 4 MG/2ML IJ SOLN
INTRAMUSCULAR | Status: DC | PRN
  Administered 2020-12-04: 4 mg via INTRAVENOUS

## 2020-12-04 MED ORDER — HYDROCODONE-ACETAMINOPHEN 5-325 MG PO TABS: 1.0000 | ORAL_TABLET | Freq: Four times a day (QID) | ORAL | 0 refills | Status: DC | PRN

## 2020-12-04 MED ORDER — FENTANYL CITRATE (PF) 100 MCG/2ML IJ SOLN
INTRAMUSCULAR | Status: DC | PRN
  Administered 2020-12-04: 25 ug via INTRAVENOUS

## 2020-12-04 MED ORDER — EPHEDRINE SULFATE 50 MG/ML IJ SOLN
INTRAMUSCULAR | Status: DC | PRN
  Administered 2020-12-04: 10 mg via INTRAVENOUS
  Administered 2020-12-04 (×2): 5 mg via INTRAVENOUS

## 2020-12-04 SURGICAL SUPPLY — 22 items
BLADE SURG 15 STRL LF DISP TIS (BLADE) ×1 IMPLANT
BLADE SURG 15 STRL SS (BLADE) ×2
CANISTER SUCT 1200ML W/VALVE (MISCELLANEOUS) ×2 IMPLANT
CORD BIP STRL DISP 12FT (MISCELLANEOUS) ×2 IMPLANT
DECANTER SPIKE VIAL GLASS SM (MISCELLANEOUS) ×2 IMPLANT
GAUZE SPONGE 4X4 12PLY STRL (GAUZE/BANDAGES/DRESSINGS) ×2 IMPLANT
GLOVE SURG ENC MOIS LTX SZ7.5 (GLOVE) ×2 IMPLANT
GOWN STRL REUS W/ TWL LRG LVL3 (GOWN DISPOSABLE) ×2 IMPLANT
GOWN STRL REUS W/TWL LRG LVL3 (GOWN DISPOSABLE) ×4
KIT TURNOVER KIT A (KITS) ×2 IMPLANT
NEEDLE HYPO 25GX1X1/2 BEV (NEEDLE) ×2 IMPLANT
PACK HEAD/NECK (MISCELLANEOUS) ×2 IMPLANT
SPONGE KITTNER 5P (MISCELLANEOUS) ×2 IMPLANT
SUCTION FRAZIER HANDLE 10FR (MISCELLANEOUS) ×1
SUCTION TUBE FRAZIER 10FR DISP (MISCELLANEOUS) ×1 IMPLANT
SUT CHROMIC 4 0 PS 2 18 (SUTURE) ×2 IMPLANT
SUT ETHILON 4-0 (SUTURE) ×2
SUT ETHILON 4-0 FS2 18XMFL BLK (SUTURE) ×1
SUT VIC AB 4-0 RB1 27 (SUTURE) ×2
SUT VIC AB 4-0 RB1 27X BRD (SUTURE) ×1 IMPLANT
SUTURE ETHLN 4-0 FS2 18XMF BLK (SUTURE) ×1 IMPLANT
SYR 10ML LL (SYRINGE) ×2 IMPLANT

## 2020-12-04 NOTE — Anesthesia Procedure Notes (Signed)
Procedure Name: LMA Insertion Date/Time: 12/04/2020 9:50 AM Performed by: Mayme Genta, CRNA Pre-anesthesia Checklist: Patient identified, Emergency Drugs available, Suction available, Timeout performed and Patient being monitored Patient Re-evaluated:Patient Re-evaluated prior to induction Oxygen Delivery Method: Circle system utilized Preoxygenation: Pre-oxygenation with 100% oxygen Induction Type: IV induction LMA: LMA inserted LMA Size: 3.0 Number of attempts: 1 Placement Confirmation: positive ETCO2 and breath sounds checked- equal and bilateral Tube secured with: Tape

## 2020-12-04 NOTE — Op Note (Signed)
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  After induction of general endotracheal anesthesia, the patient was prepped and draped in the usual sterile fashion. A proper time-out was performed, confirming the operative site and procedure.  1% lidocaine with epinephrine 1-100,000 was injected to the mucosa of the right upper lip intra-orally. An [employer] retractor was inserted into the oral cavity to retract the lip. The area of prior excision was palpated with scar tissue palpable deep the the scar line. The scar was excised in an elipse using the 15 blade and tenotomies then used to excise the underlying scar tissue with an envelope of normal fatty tissue, with roughly a 96mm margin.   AThe wound was irrigated with saline and closed with 4-0 Vicryl suture for the deep closure followed by 5-0 chromic gut suture for the mucosa.  The patient was then returned to the anesthesiologist in good condition for awakening. The patient was awakened and taken to the recovery room in good condition.   Disposition:   PACU then discharged home  Plan: Follow-up in 2 weeks for wound check.  Tylenol with hydrocodone as needed for pain.  May apply ice to area as needed for swelling.  Riley Nearing 12/04/2020 10:37 AM  12/04/2020  10:40 AM

## 2020-12-04 NOTE — H&P (Signed)
History and physical reviewed and will be scanned in later. No change in medical status reported by the patient or family, appears stable for surgery. All questions regarding the procedure answered, and patient (or family if a child) expressed understanding of the procedure. ? ?Tonya Keith S Tonya Keith ?@TODAY@ ?

## 2020-12-04 NOTE — Transfer of Care (Signed)
Immediate Anesthesia Transfer of Care Note  Patient: Tonya Keith  Procedure(s) Performed: Excision right upper lip intraoral lesion (Right: Mouth)  Patient Location: PACU  Anesthesia Type: General  Level of Consciousness: awake, alert  and patient cooperative  Airway and Oxygen Therapy: Patient Spontanous Breathing and Patient connected to supplemental oxygen  Post-op Assessment: Post-op Vital signs reviewed, Patient's Cardiovascular Status Stable, Respiratory Function Stable, Patent Airway and No signs of Nausea or vomiting  Post-op Vital Signs: Reviewed and stable  Complications: No notable events documented.

## 2020-12-04 NOTE — Anesthesia Postprocedure Evaluation (Signed)
Anesthesia Post Note  Patient: Tonya Keith  Procedure(s) Performed: Excision right upper lip intraoral lesion (Right: Mouth)     Patient location during evaluation: PACU Anesthesia Type: General Level of consciousness: awake and alert, oriented and patient cooperative Pain management: pain level controlled Vital Signs Assessment: post-procedure vital signs reviewed and stable Respiratory status: spontaneous breathing, nonlabored ventilation and respiratory function stable Cardiovascular status: blood pressure returned to baseline and stable Postop Assessment: adequate PO intake Anesthetic complications: no   No notable events documented.  Darrin Nipper

## 2020-12-06 ENCOUNTER — Encounter: Payer: Self-pay | Admitting: Otolaryngology

## 2020-12-06 LAB — SURGICAL PATHOLOGY

## 2021-01-24 ENCOUNTER — Other Ambulatory Visit: Payer: Self-pay | Admitting: Neurology

## 2021-01-24 DIAGNOSIS — G43019 Migraine without aura, intractable, without status migrainosus: Secondary | ICD-10-CM

## 2021-01-31 ENCOUNTER — Other Ambulatory Visit: Payer: Self-pay

## 2021-01-31 ENCOUNTER — Ambulatory Visit
Admission: RE | Admit: 2021-01-31 | Discharge: 2021-01-31 | Disposition: A | Discharge status: Home / Self Care | Payer: Medicare Other | Source: Ambulatory Visit | Attending: Neurology | Admitting: Neurology

## 2021-01-31 DIAGNOSIS — G43019 Migraine without aura, intractable, without status migrainosus: Secondary | ICD-10-CM | POA: Insufficient documentation

## 2021-01-31 MED ORDER — GADOBUTROL 1 MMOL/ML IV SOLN
4.0000 mL | Freq: Once | INTRAVENOUS | Status: AC | PRN
  Administered 2021-01-31: 4 mL via INTRAVENOUS

## 2021-04-08 ENCOUNTER — Other Ambulatory Visit (HOSPITAL_COMMUNITY): Payer: Self-pay | Admitting: Otolaryngology

## 2021-04-08 ENCOUNTER — Other Ambulatory Visit: Payer: Self-pay | Admitting: Otolaryngology

## 2021-04-08 DIAGNOSIS — G501 Atypical facial pain: Secondary | ICD-10-CM

## 2021-04-23 ENCOUNTER — Ambulatory Visit: Admission: RE | Admit: 2021-04-23 | Payer: Medicare Other | Source: Ambulatory Visit

## 2021-05-06 ENCOUNTER — Ambulatory Visit
Admission: RE | Admit: 2021-05-06 | Discharge: 2021-05-06 | Disposition: A | Discharge status: Home / Self Care | Payer: Medicare Other | Source: Ambulatory Visit | Attending: Otolaryngology | Admitting: Otolaryngology

## 2021-05-06 ENCOUNTER — Other Ambulatory Visit: Payer: Self-pay

## 2021-05-06 DIAGNOSIS — Z85818 Personal history of malignant neoplasm of other sites of lip, oral cavity, and pharynx: Secondary | ICD-10-CM | POA: Diagnosis not present

## 2021-05-06 DIAGNOSIS — R519 Headache, unspecified: Secondary | ICD-10-CM | POA: Diagnosis not present

## 2021-05-06 DIAGNOSIS — G501 Atypical facial pain: Secondary | ICD-10-CM | POA: Insufficient documentation

## 2021-05-06 DIAGNOSIS — Z8500 Personal history of malignant neoplasm of unspecified digestive organ: Secondary | ICD-10-CM | POA: Insufficient documentation

## 2021-05-06 MED ORDER — GADOBUTROL 1 MMOL/ML IV SOLN
4.0000 mL | Freq: Once | INTRAVENOUS | Status: AC | PRN
  Administered 2021-05-06: 4 mL via INTRAVENOUS

## 2021-08-07 ENCOUNTER — Other Ambulatory Visit: Payer: Self-pay | Admitting: Obstetrics and Gynecology

## 2021-08-07 DIAGNOSIS — Z1231 Encounter for screening mammogram for malignant neoplasm of breast: Secondary | ICD-10-CM

## 2021-09-13 ENCOUNTER — Ambulatory Visit
Admission: RE | Admit: 2021-09-13 | Discharge: 2021-09-13 | Disposition: A | Discharge status: Home / Self Care | Payer: Medicare Other | Source: Ambulatory Visit | Attending: Obstetrics and Gynecology | Admitting: Obstetrics and Gynecology

## 2021-09-13 DIAGNOSIS — Z1231 Encounter for screening mammogram for malignant neoplasm of breast: Secondary | ICD-10-CM | POA: Diagnosis present

## 2022-02-04 ENCOUNTER — Other Ambulatory Visit: Payer: Self-pay | Admitting: Family Medicine

## 2022-02-04 DIAGNOSIS — Z780 Asymptomatic menopausal state: Secondary | ICD-10-CM

## 2022-02-04 DIAGNOSIS — E119 Type 2 diabetes mellitus without complications: Secondary | ICD-10-CM | POA: Insufficient documentation

## 2022-02-05 ENCOUNTER — Other Ambulatory Visit: Payer: Self-pay | Admitting: Family Medicine

## 2022-02-05 DIAGNOSIS — R229 Localized swelling, mass and lump, unspecified: Secondary | ICD-10-CM

## 2022-02-10 ENCOUNTER — Ambulatory Visit: Admission: RE | Admit: 2022-02-10 | Payer: Medicare Other | Source: Ambulatory Visit

## 2022-02-12 ENCOUNTER — Ambulatory Visit
Admission: RE | Admit: 2022-02-12 | Discharge: 2022-02-12 | Disposition: A | Discharge status: Home / Self Care | Payer: Medicare Other | Source: Ambulatory Visit | Attending: Family Medicine | Admitting: Family Medicine

## 2022-02-12 DIAGNOSIS — R229 Localized swelling, mass and lump, unspecified: Secondary | ICD-10-CM | POA: Insufficient documentation

## 2022-02-24 ENCOUNTER — Other Ambulatory Visit (HOSPITAL_COMMUNITY): Payer: Self-pay | Admitting: Student

## 2022-02-24 ENCOUNTER — Other Ambulatory Visit: Payer: Self-pay | Admitting: Student

## 2022-02-24 DIAGNOSIS — R413 Other amnesia: Secondary | ICD-10-CM

## 2022-02-24 DIAGNOSIS — G43119 Migraine with aura, intractable, without status migrainosus: Secondary | ICD-10-CM

## 2022-03-14 ENCOUNTER — Ambulatory Visit (HOSPITAL_COMMUNITY)
Admission: RE | Admit: 2022-03-14 | Discharge: 2022-03-14 | Disposition: A | Discharge status: Home / Self Care | Payer: Medicare Other | Source: Ambulatory Visit | Attending: Student | Admitting: Student

## 2022-03-14 DIAGNOSIS — G43119 Migraine with aura, intractable, without status migrainosus: Secondary | ICD-10-CM | POA: Insufficient documentation

## 2022-03-14 DIAGNOSIS — R413 Other amnesia: Secondary | ICD-10-CM | POA: Insufficient documentation

## 2022-03-27 ENCOUNTER — Other Ambulatory Visit: Payer: Self-pay | Admitting: Student

## 2022-03-27 DIAGNOSIS — M542 Cervicalgia: Secondary | ICD-10-CM

## 2022-04-07 ENCOUNTER — Ambulatory Visit
Admission: RE | Admit: 2022-04-07 | Discharge: 2022-04-07 | Disposition: A | Discharge status: Home / Self Care | Payer: Medicare Other | Source: Ambulatory Visit | Attending: Student | Admitting: Student

## 2022-04-07 DIAGNOSIS — M542 Cervicalgia: Secondary | ICD-10-CM | POA: Diagnosis not present

## 2022-04-08 ENCOUNTER — Encounter: Payer: Self-pay | Admitting: Physical Therapy

## 2022-04-08 ENCOUNTER — Ambulatory Visit: Payer: Medicare Other | Attending: Neurology | Admitting: Speech Pathology

## 2022-04-08 ENCOUNTER — Ambulatory Visit: Payer: Medicare Other | Admitting: Physical Therapy

## 2022-04-08 DIAGNOSIS — R2681 Unsteadiness on feet: Secondary | ICD-10-CM

## 2022-04-08 DIAGNOSIS — M6281 Muscle weakness (generalized): Secondary | ICD-10-CM | POA: Diagnosis present

## 2022-04-08 DIAGNOSIS — R4189 Other symptoms and signs involving cognitive functions and awareness: Secondary | ICD-10-CM | POA: Diagnosis present

## 2022-04-08 DIAGNOSIS — R413 Other amnesia: Secondary | ICD-10-CM | POA: Insufficient documentation

## 2022-04-08 DIAGNOSIS — R2689 Other abnormalities of gait and mobility: Secondary | ICD-10-CM

## 2022-04-08 DIAGNOSIS — R41841 Cognitive communication deficit: Secondary | ICD-10-CM | POA: Insufficient documentation

## 2022-04-08 NOTE — Therapy (Signed)
OUTPATIENT PHYSICAL THERAPY NEURO EVALUATION   Patient Name: Tonya Keith MRN: [id number] DOB:Sep 25, 1960, 61 y.o., female Today's Date: 04/08/2022   PCP: Sharyne Peach, MD  REFERRING PROVIDER: Vladimir Crofts, MD    PT End of Session - 04/08/22 1653     Visit Number 1    Number of Visits 16    Date for PT Re-Evaluation 06/03/22    Progress Note Due on Visit 10    PT Start Time 8546    PT Stop Time 1600    PT Time Calculation (min) 45 min    Equipment Utilized During Treatment Gait belt    Activity Tolerance Patient tolerated treatment well;Patient limited by pain             Past Medical History:  Diagnosis Date   Anemia    Anginal pain ([place])    Breast cancer ([place]) 2008   chemo mastectomy left   Cancer ([place])    breast   COPD (chronic obstructive pulmonary disease) ([place])    Coronary artery disease    History of left heart catheterization    w/ pci to RCA   Hx of migraine headaches    Hypertension    Myocardial infarction [facility]) 2010   Personal history of chemotherapy    Wears dentures    full upper (currently unable to wear)   Past Surgical History:  Procedure Laterality Date   ABDOMINAL HYSTERECTOMY     LEFT HEART CATH AND CORONARY ANGIOGRAPHY Left 09/07/2019   Procedure: LEFT HEART CATH AND CORONARY ANGIOGRAPHY;  Surgeon: Yolonda Kida, MD;  Location: [place] [facility];  Service: Cardiovascular;  Laterality: Left;   LESION EXCISION WITH COMPLEX REPAIR Right 12/04/2020   Procedure: Excision right upper lip intraoral lesion;  Surgeon: Clyde Canterbury, MD;  Location: [place];  Service: ENT;  Laterality: Right;  Latex   MASTECTOMY Left 2008   MINOR EXCISION OF ORAL LESION N/A 10/02/2020   Procedure: excision of right upper lip mass, intraoral;  Surgeon: Clyde Canterbury, MD;  Location: [place];  Service: ENT;  Laterality: N/A;  Latex   Patient Active Problem List   Diagnosis Date Noted   Hypothermia 12/03/2017   Bilateral lower  extremity pain 11/17/2017   Bilateral lower extremity edema 11/17/2017   Tobacco abuse 11/17/2017   Essential hypertension 11/17/2017   Hyperlipidemia 11/17/2017    ONSET DATE: 10/24/20  REFERRING DIAG: R26.89 (ICD-10-CM) - Imbalance   THERAPY DIAG:  Unsteadiness on feet  Other abnormalities of gait and mobility  Muscle weakness (generalized)  Rationale for Evaluation and Treatment: Rehabilitation  SUBJECTIVE:  SUBJECTIVE STATEMENT: Pt reports imbalance that has started in the middle part of last year. Pt reports she has had 1 fall within the last 12 months where she fell and hit her head on the wall. She used to do a lot of walking but now she is afraid she will lose her balance and fall in the road if she attempts to go for a walk. Pt want to ensure she has the strength to take care of herself as she gets older. Pt wants to improve her balance with therapy and also wants to improve her strength so she can get stronger.  Pt accompanied by: self  PERTINENT HISTORY: Pt reports imbalance that has started in the middle part of last year. Pt reports she has had 1 fall within the last 12 months where she fell and hit her head on the wall. She used to do a lot of walking but now she is afraid she will lose her balance and fall in the road if she attempts to go for a walk. Pt want to ensure she has the strength to take care of herself as she gets older. Pt wants to improve her balance with therapy and also wants to improve her strength so she can get stronger.   PAIN:  Are you having pain? Yes: NPRS scale: 7/10 Pain location: legs R > L with hip being primary pain site, pt does report generalized pain in her Les bilaterally  Pain description: Achy, burning Aggravating factors: sleeping, walking prolonged  distances, standing too long Relieving factors: rest, movement, elevation   PRECAUTIONS: Fall  WEIGHT BEARING RESTRICTIONS: No  FALLS: Has patient fallen in last 6 months? Yes. Number of falls 1  LIVING ENVIRONMENT: Lives with: lives with their family Lives in: House/apartment Stairs: No Has following equipment at home: None  PLOF: Independent  PATIENT GOALS: improve balance, mobility and strength   OBJECTIVE:   DIAGNOSTIC FINDINGS:  MRI Brain impression. Age appropriate volume loss without lobar predominance.   COGNITION: Overall cognitive status: History of cognitive impairments - at baseline- memory therapy with [facility] currently   SENSATION: WFL  COORDINATION: Pin with heel to shin usign R LE, no s/s of incoordination  EDEMA:  None   MPOSTURE: No Significant postural limitations  LOWER EXTREMITY ROM:   R hip limited external rotaion although no measured, other joints WNL in LE   Active  Right Eval Left Eval  Hip flexion    Hip extension    Hip abduction    Hip adduction    Hip internal rotation    Hip external rotation    Knee flexion    Knee extension    Ankle dorsiflexion    Ankle plantarflexion    Ankle inversion    Ankle eversion     (Blank rows = not tested)  LOWER EXTREMITY MMT:    MMT Right Eval Left Eval  Hip flexion 3+* 4  Hip extension    Hip abduction 3+ 4  Hip adduction 3+ 4  Hip internal rotation    Hip external rotation    Knee flexion 4- 4  Knee extension 3+* 4  Ankle dorsiflexion 3+ 4  Ankle plantarflexion 4+ 4+  Ankle inversion    Ankle eversion    (Blank rows = not tested)  BED MOBILITY:  Pt has pain in her right hip with pain mobility.   TRANSFERS: Assistive device utilized: None  Sit to stand: Modified independence Stand to sit: Complete Independence Chair to chair: Modified  independence Floor:  not tested   GAIT: Gait pattern: decreased step length- Left, decreased stance time- Right, scissoring, and  antalgic Distance walked: 180 feet  Assistive device utilized: None Level of assistance: Modified independence Comments: intermittent scissoring gait, fatigued quickly with limitation by R hip pain.   FUNCTIONAL TESTS:  5 times sit to stand: 27.98 sec  6 minute walk test: unable to copmlete secondary to hip pain; 2 minute walk test 190 feet  10 meter walk test: .603 Berg Balance Scale: 41  PATIENT SURVEYS:  FOTO 43 risk adjusted goal of 52   TODAY'S TREATMENT:                                                                                                                              DATE: Eval Only     PATIENT EDUCATION: Education details: POC Person educated: Patient Education method: Explanation Education comprehension: verbalized understanding  HOME EXERCISE PROGRAM: Begin Visit 2   GOALS: Goals reviewed with patient? Yes  SHORT TERM GOALS: Target date: 05/06/2022     Patient will be independent in home exercise program to improve strength/mobility for better functional independence with ADLs. Baseline: No HEP currently  Goal status: INITIAL  LONG TERM GOALS: Target date: 07/01/2022    1.  Patient (> 32 years old) will complete five times sit to stand test in < 15 seconds indicating an increased LE strength and improved balance. Baseline: 27.98sec Goal status: INITIAL  2.  Patient will increase FOTO score to equal to or greater than  52   to demonstrate statistically significant improvement in mobility and quality of life.  Baseline: 43 Goal status: INITIAL   3.  Patient will increase Berg Balance score by > 6 points to demonstrate decreased fall risk during functional activities. Baseline: 41 Goal status: INITIAL  4.   Patient will increase 10 meter walk test to >.36ms as to improve gait speed for better community ambulation and to reduce fall risk. Baseline: .603 m/s Goal status: INITIAL  5.   Patient will increase 2 minute walk test distance to >260  feet for progression to community ambulator and improve gait ability Baseline: 190 ft Goal status: INITIAL   ASSESSMENT:  CLINICAL IMPRESSION: Patient is a 61y.o. Female  who was seen today for physical therapy evaluation and treatment for imbalance.  Patient presents with deficits in lower extremity strength with increased deficits noted on the right lower extremity compared to the left.  Patient also has noted generalized lower extremity weakness as evidenced by 5 times sit to stand edition her 5 times sit to stand score also places her at increased risk for falling.  Patient also presents with increased risk of falls as evidenced by BMerrilee Janskybalance scale and her 10 m walk test.  In addition patient's overall mobility is impaired as evidenced by decreased distance with 2-minute walk test.  Patient will benefit from physical therapy interventions to improve her lower extremity  strength, improve her balance, decrease her risk of falls.  OBJECTIVE IMPAIRMENTS: Abnormal gait, decreased activity tolerance, decreased balance, decreased endurance, decreased mobility, difficulty walking, decreased strength, and hypomobility.   ACTIVITY LIMITATIONS: standing, squatting, bed mobility, and locomotion level  PARTICIPATION LIMITATIONS: cleaning, laundry, shopping, community activity, and yard work  PERSONAL FACTORS: 3+ comorbidities: HTN, MI, Bilateral LE pain  are also affecting patient's functional outcome.   REHAB POTENTIAL: Good  CLINICAL DECISION MAKING: Stable/uncomplicated  EVALUATION COMPLEXITY: Low  PLAN:  PT FREQUENCY: 1-2x/week  PT DURATION: 8 weeks  PLANNED INTERVENTIONS: Therapeutic exercises, Therapeutic activity, Neuromuscular re-education, Balance training, Gait training, Patient/Family education, Self Care, Joint mobilization, and Stair training  PLAN FOR NEXT SESSION: Begin POC, provide and educate regarding HEP and importance of completion    Particia Lather,  PT 04/08/2022, 4:54 PM

## 2022-04-09 NOTE — Therapy (Signed)
OUTPATIENT SPEECH LANGUAGE PATHOLOGY  COGNITION EVALUATION   Patient Name: Tonya Keith MRN: [id number] DOB:16-Aug-1960, 61 y.o., female Today's Date: 04/09/2022  PCP: Salome Holmes, MD REFERRING PROVIDER: Jennings Books, MD   End of Session - 04/09/22 1234     Visit Number 1    Number of Visits 17    Date for SLP Re-Evaluation 06/03/22    Authorization Type [employer] Medicare    Authorization Time Period 04/08/2022 thru 06/03/2022    Progress Note Due on Visit 10    SLP Start Time 1345    SLP Stop Time  1445    SLP Time Calculation (min) 60 min    Activity Tolerance Patient tolerated treatment well             Past Medical History:  Diagnosis Date   Anemia    Anginal pain ([place])    Breast cancer ([place]) 2008   chemo mastectomy left   Cancer ([place])    breast   COPD (chronic obstructive pulmonary disease) ([place])    Coronary artery disease    History of left heart catheterization    w/ pci to RCA   Hx of migraine headaches    Hypertension    Myocardial infarction [facility]) 2010   Personal history of chemotherapy    Wears dentures    full upper (currently unable to wear)   Past Surgical History:  Procedure Laterality Date   ABDOMINAL HYSTERECTOMY     LEFT HEART CATH AND CORONARY ANGIOGRAPHY Left 09/07/2019   Procedure: LEFT HEART CATH AND CORONARY ANGIOGRAPHY;  Surgeon: Yolonda Kida, MD;  Location: [place] [facility];  Service: Cardiovascular;  Laterality: Left;   LESION EXCISION WITH COMPLEX REPAIR Right 12/04/2020   Procedure: Excision right upper lip intraoral lesion;  Surgeon: Clyde Canterbury, MD;  Location: [place];  Service: ENT;  Laterality: Right;  Latex   MASTECTOMY Left 2008   MINOR EXCISION OF ORAL LESION N/A 10/02/2020   Procedure: excision of right upper lip mass, intraoral;  Surgeon: Clyde Canterbury, MD;  Location: [place];  Service: ENT;  Laterality: N/A;  Latex   Patient Active Problem List   Diagnosis Date Noted   Hypothermia  12/03/2017   Bilateral lower extremity pain 11/17/2017   Bilateral lower extremity edema 11/17/2017   Tobacco abuse 11/17/2017   Essential hypertension 11/17/2017   Hyperlipidemia 11/17/2017    ONSET DATE: referral date 02/18/2022; memory loss dating back to 2022  REFERRING DIAG: R41.3 (ICD-10-CM) - Memory loss or impairment   THERAPY DIAG:  Cognitive communication deficit  Cognitive impairment  R41.3 (ICD-10-CM) - Memory loss or impairment   Rationale for Evaluation and Treatment Rehabilitation  SUBJECTIVE:   SUBJECTIVE STATEMENT: "I loss my train of thought" Pt accompanied by: self  PERTINENT HISTORY:  Memory loss -  Onset has been gradual since 2022 Headaches - in a patient with history of migraines since early age years with increased headache in past several years + occasional pulsatile tinnitus, valsalva induced headache + partially empty sella -concerning for component of intracranial hypertension   DIAGNOSTIC FINDINGS:   MRV Head Without Contrast 03/14/2022 -  1. Age appropriate volume loss without lobar predominance. See  neuroquant analysis for additional findings.  2. Partially empty sella and mild tonsillar ectopia measuring up to  7 mm. Correlate with symptoms of idiopathic intracranial  hypertension.  3. Left sphenoid sinusitis.  4. No evidence of dural venous sinus thromobosis.   Cerebellar Tonsillar Ectopia 3m   SLUMS -  02/13/2022 was 20/30 Difficulty with mental math problem solving; clock drawing and attention (digits backwards)  PAIN:  Are you having pain? No   FALLS: Has patient fallen in last 6 months?  No  LIVING ENVIRONMENT: Lives with: lives alone Lives in: House/apartment  PLOF:  Level of assistance: Independent with ADLs, Independent with IADLs Employment: On disability   PATIENT GOALS pt unable to state at this time  OBJECTIVE:   COGNITIVE COMMUNICATION Overall cognitive status: No family/caregiver present to determine  baseline cognitive functioning During this assessment pt obtained a score of 14 out of 30 on the SLUMS. This is a decline of 6 points with significant difficulty with clock drawing (although pt reports this to be baseline), recall of digits backwards, generative naming as well as answering comprehension questions after listening to a short story.   ORAL MOTOR EXAMINATION Facial : WFL Lingual: WFL Velum: WFL Mandible: WFL Cough: WFL Voice: WFL   STANDARDIZED ASSESSMENTS: The "Alice Mental Status" (SLUMS) Examination was administered. Pt scored 14/30, raising concern for the presence of a neurocognitive disorder. Further testing would be beneficial, as deficits of attention, memory, oriantion, problem solving, and executive functions identified today may negatively impact pt safety with independent living.       PATIENT REPORTED OUTCOME MEASURES (PROM): Informal - pt reports overall dissatisfaction with memory abilities  TODAY'S TREATMENT:  N/A    PATIENT EDUCATION: Education details: results of this evaluation and ST POC Person educated: Patient Education method: Explanation Education comprehension: verbalized understanding and needs further education     GOALS: Goals reviewed with patient? Yes  SHORT TERM GOALS: Target date: 10 sessions  Given a functional problem (ie., social problem, multi-step math word problem, logic puzzle) or scenario from daily life, patient will demonstrate problem solving with > 80% accuracy and rare Min A.  Baseline: Goal status: INITIAL  Pt will use strategies to improve memory for important information with 75% acc. with minimal assistance (ie., white board, daily planner/calendar, Apps on phone).  Baseline: Goal status: INITIAL   LONG TERM GOALS: Target date: 06/03/2022 Given a functional problem a scenario from daily life, patient will demonstrate error awareness and correct errors independently with at least 90% acc.    Baseline:  Goal status: INITIAL  2.  Pt will use strategies to improve memory for important information with 90% acc. Independently(ie., white board, daily planner/calendar, Apps on phone).  Baseline:  Goal status: INITIAL   ASSESSMENT:  CLINICAL IMPRESSION: Patient is a 61 y.o. female who was seen today for a formal cognition evaluation d/t concern for memory loss.  At baseline, pt reports completion of 11th grade with self-reported difficulty in school with math and telling time.  During this assessment, it was difficult to distinguish between educational level, socioeconomic opportunities and cognitive deficits. For example, pt doesn't have a car, so opportunities to interact with others is very limited. She doesn't grocery shop for herself and also reports difficulty affording groceries so she doesn't have the opportunity to perform cognitive tasks surrounding these events. She reports using a written list to keep track of doctor's appts.   She can be in the middle of a conversation and forget what she is talking about. She has trouble understanding conversations. She has difficulty understanding instructions. She still manages her own finances. She still drives. She manages her own medications. She does not require any assistance with Activities of Daily Living. She does notice any mood changes. She has a hearing impairment. Denies confusion.  Denies falls, head injury.    OBJECTIVE IMPAIRMENTS include {SLPOBJIMP:27107}. These impairments are limiting patient from {SLPLIMIT:27108}. Factors affecting potential to achieve goals and functional outcome are {SLP factors:25450}.. Patient will benefit from skilled SLP services to address above impairments and improve overall function.  REHAB POTENTIAL: {rehabpotential:25112}  PLAN: SLP FREQUENCY: {rehab frequency:25116}  SLP DURATION: {rehab duration:25117}  PLANNED INTERVENTIONS: {SLP treatment/interventions:25449}   Lindora Alviar B. Rutherford Nail,  M.S., CCC-SLP, Mining engineer Certified Brain Injury [place]  [place] Office [phone] Ascom [phone] Fax [phone]

## 2022-04-14 NOTE — Therapy (Incomplete)
OUTPATIENT PHYSICAL THERAPY NEURO EVALUATION   Patient Name: Tonya Keith MRN: [id number] DOB:07/27/1960, 61 y.o., female Today's Date: 04/14/2022   PCP: Sharyne Peach, MD  REFERRING PROVIDER: Vladimir Crofts, MD      Past Medical History:  Diagnosis Date   Anemia    Anginal pain ([place])    Breast cancer [facility]) 2008   chemo mastectomy left   Cancer [facility])    breast   COPD (chronic obstructive pulmonary disease) ([place])    Coronary artery disease    History of left heart catheterization    w/ pci to RCA   Hx of migraine headaches    Hypertension    Myocardial infarction [facility]) 2010   Personal history of chemotherapy    Wears dentures    full upper (currently unable to wear)   Past Surgical History:  Procedure Laterality Date   ABDOMINAL HYSTERECTOMY     LEFT HEART CATH AND CORONARY ANGIOGRAPHY Left 09/07/2019   Procedure: LEFT HEART CATH AND CORONARY ANGIOGRAPHY;  Surgeon: Yolonda Kida, MD;  Location: [place] [facility];  Service: Cardiovascular;  Laterality: Left;   LESION EXCISION WITH COMPLEX REPAIR Right 12/04/2020   Procedure: Excision right upper lip intraoral lesion;  Surgeon: Clyde Canterbury, MD;  Location: [place];  Service: ENT;  Laterality: Right;  Latex   MASTECTOMY Left 2008   MINOR EXCISION OF ORAL LESION N/A 10/02/2020   Procedure: excision of right upper lip mass, intraoral;  Surgeon: Clyde Canterbury, MD;  Location: [place];  Service: ENT;  Laterality: N/A;  Latex   Patient Active Problem List   Diagnosis Date Noted   Hypothermia 12/03/2017   Bilateral lower extremity pain 11/17/2017   Bilateral lower extremity edema 11/17/2017   Tobacco abuse 11/17/2017   Essential hypertension 11/17/2017   Hyperlipidemia 11/17/2017    ONSET DATE: 10/24/20  REFERRING DIAG: R26.89 (ICD-10-CM) - Imbalance   THERAPY DIAG:  No diagnosis found.  Rationale for Evaluation and Treatment: Rehabilitation  SUBJECTIVE:                                                                                                                                                                                              SUBJECTIVE STATEMENT: *** Pt accompanied by: self  PERTINENT HISTORY: Pt reports imbalance that has started in the middle part of last year. Pt reports she has had 1 fall within the last 12 months where she fell and hit her head on the wall. She used to do a lot of walking but now she is afraid she will lose her balance and fall in the  road if she attempts to go for a walk. Pt want to ensure she has the strength to take care of herself as she gets older. Pt wants to improve her balance with therapy and also wants to improve her strength so she can get stronger.   PAIN:  Are you having pain? Yes: NPRS scale: 7/10 Pain location: legs R > L with hip being primary pain site, pt does report generalized pain in her Les bilaterally  Pain description: Achy, burning Aggravating factors: sleeping, walking prolonged distances, standing too long Relieving factors: rest, movement, elevation   PRECAUTIONS: Fall  WEIGHT BEARING RESTRICTIONS: No  FALLS: Has patient fallen in last 6 months? Yes. Number of falls 1  LIVING ENVIRONMENT: Lives with: lives with their family Lives in: House/apartment Stairs: No Has following equipment at home: None  PLOF: Independent  PATIENT GOALS: improve balance, mobility and strength   OBJECTIVE:   DIAGNOSTIC FINDINGS:  MRI Brain impression. Age appropriate volume loss without lobar predominance.   COGNITION: Overall cognitive status: History of cognitive impairments - at baseline- memory therapy with [facility] currently   SENSATION: WFL  COORDINATION: Pin with heel to shin usign R LE, no s/s of incoordination   MPOSTURE: No Significant postural limitations  LOWER EXTREMITY ROM:   R hip limited external rotaion although no measured, other joints WNL in LE   LOWER EXTREMITY MMT:    MMT Right Eval  Left Eval  Hip flexion 3+* 4  Hip extension    Hip abduction 3+ 4  Hip adduction 3+ 4  Hip internal rotation    Hip external rotation    Knee flexion 4- 4  Knee extension 3+* 4  Ankle dorsiflexion 3+ 4  Ankle plantarflexion 4+ 4+  Ankle inversion    Ankle eversion    (Blank rows = not tested)  BED MOBILITY:  Pt has pain in her right hip with pain mobility.   TRANSFERS: Assistive device utilized: None  Sit to stand: Modified independence Stand to sit: Complete Independence Chair to chair: Modified independence Floor:  not tested   GAIT: Gait pattern: decreased step length- Left, decreased stance time- Right, scissoring, and antalgic Distance walked: 180 feet  Assistive device utilized: None Level of assistance: Modified independence Comments: intermittent scissoring gait, fatigued quickly with limitation by R hip pain.   FUNCTIONAL TESTS:  5 times sit to stand: 27.98 sec  6 minute walk test: unable to copmlete secondary to hip pain; 2 minute walk test 190 feet  10 meter walk test: .603 Berg Balance Scale: 41  PATIENT SURVEYS:  FOTO 43 risk adjusted goal of 52   TODAY'S TREATMENT:                                                                                                                              DATE: Eval Only     PATIENT EDUCATION: Education details: POC Person educated: Patient Education method: Explanation Education comprehension: verbalized understanding  HOME EXERCISE PROGRAM: Begin Visit 2   GOALS: Goals reviewed with patient? Yes  SHORT TERM GOALS: Target date: 05/06/2022     Patient will be independent in home exercise program to improve strength/mobility for better functional independence with ADLs. Baseline: No HEP currently  Goal status: INITIAL  LONG TERM GOALS: Target date: 07/01/2022    1.  Patient (> 21 years old) will complete five times sit to stand test in < 15 seconds indicating an increased LE strength and improved  balance. Baseline: 27.98sec Goal status: INITIAL  2.  Patient will increase FOTO score to equal to or greater than  52   to demonstrate statistically significant improvement in mobility and quality of life.  Baseline: 43 Goal status: INITIAL   3.  Patient will increase Berg Balance score by > 6 points to demonstrate decreased fall risk during functional activities. Baseline: 41 Goal status: INITIAL  4.   Patient will increase 10 meter walk test to >.37ms as to improve gait speed for better community ambulation and to reduce fall risk. Baseline: .603 m/s Goal status: INITIAL  5.   Patient will increase 2 minute walk test distance to >260 feet for progression to community ambulator and improve gait ability Baseline: 190 ft Goal status: INITIAL   ASSESSMENT:  CLINICAL IMPRESSION: *** Patient is a 61y.o. Female  who was seen today for physical therapy evaluation and treatment for imbalance.  Patient presents with deficits in lower extremity strength with increased deficits noted on the right lower extremity compared to the left.  Patient also has noted generalized lower extremity weakness as evidenced by 5 times sit to stand edition her 5 times sit to stand score also places her at increased risk for falling.  Patient also presents with increased risk of falls as evidenced by BMerrilee Janskybalance scale and her 10 m walk test.  In addition patient's overall mobility is impaired as evidenced by decreased distance with 2-minute walk test.  Patient will benefit from physical therapy interventions to improve her lower extremity strength, improve her balance, decrease her risk of falls.  OBJECTIVE IMPAIRMENTS: Abnormal gait, decreased activity tolerance, decreased balance, decreased endurance, decreased mobility, difficulty walking, decreased strength, and hypomobility.   ACTIVITY LIMITATIONS: standing, squatting, bed mobility, and locomotion level  PARTICIPATION LIMITATIONS: cleaning, laundry,  shopping, community activity, and yard work  PERSONAL FACTORS: 3+ comorbidities: HTN, MI, Bilateral LE pain  are also affecting patient's functional outcome.   REHAB POTENTIAL: Good  CLINICAL DECISION MAKING: Stable/uncomplicated  EVALUATION COMPLEXITY: Low  PLAN:  PT FREQUENCY: 1-2x/week  PT DURATION: 8 weeks  PLANNED INTERVENTIONS: Therapeutic exercises, Therapeutic activity, Neuromuscular re-education, Balance training, Gait training, Patient/Family education, Self Care, Joint mobilization, and Stair training  PLAN FOR NEXT SESSION: Begin POC, provide and educate regarding HEP and importance of completion    MJanna Arch PT 04/14/2022, 3:38 PM

## 2022-04-15 ENCOUNTER — Ambulatory Visit: Payer: Medicare Other

## 2022-04-15 ENCOUNTER — Ambulatory Visit: Payer: Medicare Other | Admitting: Speech Pathology

## 2022-04-16 ENCOUNTER — Other Ambulatory Visit: Payer: Self-pay | Admitting: Specialist

## 2022-04-16 DIAGNOSIS — R911 Solitary pulmonary nodule: Secondary | ICD-10-CM

## 2022-04-23 ENCOUNTER — Ambulatory Visit: Payer: Medicare Other | Admitting: Speech Pathology

## 2022-04-23 ENCOUNTER — Ambulatory Visit: Payer: Medicare Other

## 2022-04-23 DIAGNOSIS — R2681 Unsteadiness on feet: Secondary | ICD-10-CM

## 2022-04-23 DIAGNOSIS — R4189 Other symptoms and signs involving cognitive functions and awareness: Secondary | ICD-10-CM

## 2022-04-23 DIAGNOSIS — R413 Other amnesia: Secondary | ICD-10-CM

## 2022-04-23 DIAGNOSIS — M6281 Muscle weakness (generalized): Secondary | ICD-10-CM

## 2022-04-23 DIAGNOSIS — R41841 Cognitive communication deficit: Secondary | ICD-10-CM | POA: Diagnosis not present

## 2022-04-23 DIAGNOSIS — R2689 Other abnormalities of gait and mobility: Secondary | ICD-10-CM

## 2022-04-23 NOTE — Therapy (Signed)
OUTPATIENT SPEECH LANGUAGE PATHOLOGY TREATMENT NOTE   Patient Name: Tonya Keith MRN: [id number] DOB:12-15-1960, 61 y.o., female Today's Date: 04/23/2022  PCP: Salome Holmes, MD REFERRING PROVIDER: Jennings Books, MD  END OF SESSION:   End of Session - 04/23/22 1416     Visit Number 2    Number of Visits 17    Date for SLP Re-Evaluation 06/03/22    Authorization Type [employer] Medicare    Authorization Time Period 04/08/2022 thru 06/03/2022    Progress Note Due on Visit 10    SLP Start Time 1400    SLP Stop Time  1500    SLP Time Calculation (min) 60 min    Activity Tolerance Patient tolerated treatment well             Past Medical History:  Diagnosis Date   Anemia    Anginal pain ([place])    Breast cancer ([place]) 2008   chemo mastectomy left   Cancer ([place])    breast   COPD (chronic obstructive pulmonary disease) ([place])    Coronary artery disease    History of left heart catheterization    w/ pci to RCA   Hx of migraine headaches    Hypertension    Myocardial infarction [facility]) 2010   Personal history of chemotherapy    Wears dentures    full upper (currently unable to wear)   Past Surgical History:  Procedure Laterality Date   ABDOMINAL HYSTERECTOMY     LEFT HEART CATH AND CORONARY ANGIOGRAPHY Left 09/07/2019   Procedure: LEFT HEART CATH AND CORONARY ANGIOGRAPHY;  Surgeon: Yolonda Kida, MD;  Location: [place] [facility];  Service: Cardiovascular;  Laterality: Left;   LESION EXCISION WITH COMPLEX REPAIR Right 12/04/2020   Procedure: Excision right upper lip intraoral lesion;  Surgeon: Clyde Canterbury, MD;  Location: [place];  Service: ENT;  Laterality: Right;  Latex   MASTECTOMY Left 2008   MINOR EXCISION OF ORAL LESION N/A 10/02/2020   Procedure: excision of right upper lip mass, intraoral;  Surgeon: Clyde Canterbury, MD;  Location: [place];  Service: ENT;  Laterality: N/A;  Latex   Patient Active Problem List   Diagnosis Date Noted   Hypothermia  12/03/2017   Bilateral lower extremity pain 11/17/2017   Bilateral lower extremity edema 11/17/2017   Tobacco abuse 11/17/2017   Essential hypertension 11/17/2017   Hyperlipidemia 11/17/2017    ONSET DATE: referral date 02/18/2022; memory loss dating back to 2022   REFERRING DIAG: R41.3 (ICD-10-CM) - Memory loss or impairment    PERTINENT HISTORY:  Memory loss -  Onset has been gradual since 2022 Headaches - in a patient with history of migraines since early age years with increased headache in past several years + occasional pulsatile tinnitus, valsalva induced headache + partially empty sella -concerning for component of intracranial hypertension     DIAGNOSTIC FINDINGS:    MRV Head Without Contrast 03/14/2022 -  1. Age appropriate volume loss without lobar predominance. See  neuroquant analysis for additional findings.  2. Partially empty sella and mild tonsillar ectopia measuring up to  7 mm. Correlate with symptoms of idiopathic intracranial  hypertension.  3. Left sphenoid sinusitis.  4. No evidence of dural venous sinus thromobosis.    Cerebellar Tonsillar Ectopia 65m    SLUMS -  02/13/2022 was 20/30 Difficulty with mental math problem solving; clock drawing and attention (digits backwards) THERAPY DIAG:  Cognitive communication deficit  Cognitive impairment  Memory loss or impairment  Rationale for Evaluation and Treatment Rehabilitation  SUBJECTIVE: pt pleasant, arrived for PT session late d/t issues with transportation  Pt accompanied by: self  PAIN:  Are you having pain? No  PATIENT GOALS: pt unable to state  OBJECTIVE:   TODAY'S TREATMENT:  Skilled treatment session focused on pt's cognitive impairment, specifically her memory loss. SLP facilitated the session by providing the following activities:  Pt lays her bills on the table and when she receives her check on the 3rd, pt's daughter or her neice come by and pay bills using the telephone.   Pt  shows calendar app on her cell phone with appropriate appointments noted with address as well.   using public transportation without assistance  While pt states that she uses lists to help her remember things, she is inconsistent in writing things down. In collaberation with pt, SLP helped pt identify ways to increase consistency.   In addition, SLP provided pt with list of activities to do during the day to promote cognitive function.     SLP further facilitated session by providing rare   Min A for putting together a puzzle.    PATIENT EDUCATION: Education details: external memory aids Person educated: Patient Education method: Explanation Education comprehension: verbalized understanding and needs further education  HOME EXERCISE PROGRAM:   Complete seasonal word searches  GOALS: Goals reviewed with patient? Yes   SHORT TERM GOALS: Target date: 10 sessions   Given a functional problem (ie., social problem, multi-step math word problem, logic puzzle) or scenario from daily life, patient will demonstrate problem solving with > 80% accuracy and rare Min A.  Baseline: Goal status: INITIAL   Pt will use strategies to improve memory for important information with 75% acc. with minimal assistance (ie., white board, daily planner/calendar, Apps on phone).  Baseline: Goal status: INITIAL     LONG TERM GOALS: Target date: 06/03/2022 Given a functional problem a scenario from daily life, patient will demonstrate error awareness and correct errors independently with at least 90% acc.              Baseline:  Goal status: INITIAL   2.  Pt will use strategies to improve memory for important information with 90% acc. Independently(ie., white board, daily planner/calendar, Apps on phone).  Baseline:  Goal status: INITIAL  ASSESSMENT:  CLINICAL IMPRESSION:  Pt presents with cognitive communication impairments that would benefit from skilled therapy to improve use of internal and  external aids.   OBJECTIVE IMPAIRMENTS include memory and executive functioning. These impairments are limiting patient from managing medications, managing appointments, managing finances, household responsibilities, and ADLs/IADLs. Factors affecting potential to achieve goals and functional outcome are medical prognosis, previous level of function, severity of impairments, financial resources, and family/community support. Patient will benefit from skilled SLP services to address above impairments and improve overall function.  REHAB POTENTIAL: Good  PLAN: SLP FREQUENCY: 1-2x/week  SLP DURATION: 8 weeks  PLANNED INTERVENTIONS: Environmental controls, Cognitive reorganization, Internal/external aids, Functional tasks, SLP instruction and feedback, Compensatory strategies, and Patient/family education   Alvin Diffee B. Rutherford Nail, M.S., CCC-SLP, Mining engineer Certified Brain Injury [place]  [place] Office [phone] Ascom [phone] Fax [phone]

## 2022-04-23 NOTE — Therapy (Signed)
OUTPATIENT PHYSICAL THERAPY NEURO EVALUATION   Patient Name: Tonya Keith MRN: [id number] DOB:May 24, 1961, 61 y.o., female Today's Date: 04/23/2022   PCP: Sharyne Peach, MD  REFERRING PROVIDER: Vladimir Crofts, MD    PT End of Session - 04/23/22 1331     Visit Number 2    Number of Visits 16    Date for PT Re-Evaluation 06/03/22    Progress Note Due on Visit 10    PT Start Time 1331    PT Stop Time 1400    PT Time Calculation (min) 29 min    Equipment Utilized During Treatment Gait belt    Activity Tolerance Patient tolerated treatment well;Patient limited by pain              Past Medical History:  Diagnosis Date   Anemia    Anginal pain ([place])    Breast cancer ([place]) 2008   chemo mastectomy left   Cancer ([place])    breast   COPD (chronic obstructive pulmonary disease) ([place])    Coronary artery disease    History of left heart catheterization    w/ pci to RCA   Hx of migraine headaches    Hypertension    Myocardial infarction [facility]) 2010   Personal history of chemotherapy    Wears dentures    full upper (currently unable to wear)   Past Surgical History:  Procedure Laterality Date   ABDOMINAL HYSTERECTOMY     LEFT HEART CATH AND CORONARY ANGIOGRAPHY Left 09/07/2019   Procedure: LEFT HEART CATH AND CORONARY ANGIOGRAPHY;  Surgeon: Yolonda Kida, MD;  Location: [place] [facility];  Service: Cardiovascular;  Laterality: Left;   LESION EXCISION WITH COMPLEX REPAIR Right 12/04/2020   Procedure: Excision right upper lip intraoral lesion;  Surgeon: Clyde Canterbury, MD;  Location: [place];  Service: ENT;  Laterality: Right;  Latex   MASTECTOMY Left 2008   MINOR EXCISION OF ORAL LESION N/A 10/02/2020   Procedure: excision of right upper lip mass, intraoral;  Surgeon: Clyde Canterbury, MD;  Location: [place];  Service: ENT;  Laterality: N/A;  Latex   Patient Active Problem List   Diagnosis Date Noted   Hypothermia 12/03/2017   Bilateral lower  extremity pain 11/17/2017   Bilateral lower extremity edema 11/17/2017   Tobacco abuse 11/17/2017   Essential hypertension 11/17/2017   Hyperlipidemia 11/17/2017    ONSET DATE: 10/24/20  REFERRING DIAG: R26.89 (ICD-10-CM) - Imbalance   THERAPY DIAG:  Unsteadiness on feet  Other abnormalities of gait and mobility  Muscle weakness (generalized)  Rationale for Evaluation and Treatment: Rehabilitation  SUBJECTIVE:  SUBJECTIVE STATEMENT: Pt reports running late today due to transportation issues. States her right leg is sore and stiff today.   Pt accompanied by: self  PERTINENT HISTORY: Pt reports imbalance that has started in the middle part of last year. Pt reports she has had 1 fall within the last 12 months where she fell and hit her head on the wall. She used to do a lot of walking but now she is afraid she will lose her balance and fall in the road if she attempts to go for a walk. Pt want to ensure she has the strength to take care of herself as she gets older. Pt wants to improve her balance with therapy and also wants to improve her strength so she can get stronger.   PAIN:  Are you having pain? Yes: NPRS scale: 7/10 Pain location: legs R > L with hip being primary pain site, pt does report generalized pain in her Les bilaterally  Pain description: Achy, burning Aggravating factors: sleeping, walking prolonged distances, standing too long Relieving factors: rest, movement, elevation   PRECAUTIONS: Fall  WEIGHT BEARING RESTRICTIONS: No  FALLS: Has patient fallen in last 6 months? Yes. Number of falls 1  LIVING ENVIRONMENT: Lives with: lives with their family Lives in: House/apartment Stairs: No Has following equipment at home: None  PLOF: Independent  PATIENT GOALS: improve balance,  mobility and strength   OBJECTIVE:   DIAGNOSTIC FINDINGS:  MRI Brain impression. Age appropriate volume loss without lobar predominance.   COGNITION: Overall cognitive status: History of cognitive impairments - at baseline- memory therapy with [facility] currently   SENSATION: WFL  COORDINATION: Pin with heel to shin usign R LE, no s/s of incoordination  EDEMA:  None   MPOSTURE: No Significant postural limitations  LOWER EXTREMITY ROM:   R hip limited external rotaion although no measured, other joints WNL in LE   Active  Right Eval Left Eval  Hip flexion    Hip extension    Hip abduction    Hip adduction    Hip internal rotation    Hip external rotation    Knee flexion    Knee extension    Ankle dorsiflexion    Ankle plantarflexion    Ankle inversion    Ankle eversion     (Blank rows = not tested)  LOWER EXTREMITY MMT:    MMT Right Eval Left Eval  Hip flexion 3+* 4  Hip extension    Hip abduction 3+ 4  Hip adduction 3+ 4  Hip internal rotation    Hip external rotation    Knee flexion 4- 4  Knee extension 3+* 4  Ankle dorsiflexion 3+ 4  Ankle plantarflexion 4+ 4+  Ankle inversion    Ankle eversion    (Blank rows = not tested)  BED MOBILITY:  Pt has pain in her right hip with pain mobility.   TRANSFERS: Assistive device utilized: None  Sit to stand: Modified independence Stand to sit: Complete Independence Chair to chair: Modified independence Floor:  not tested   GAIT: Gait pattern: decreased step length- Left, decreased stance time- Right, scissoring, and antalgic Distance walked: 180 feet  Assistive device utilized: None Level of assistance: Modified independence Comments: intermittent scissoring gait, fatigued quickly with limitation by R hip pain.   FUNCTIONAL TESTS:  5 times sit to stand: 27.98 sec  6 minute walk test: unable to copmlete secondary to hip pain; 2 minute walk test 190 feet  10 meter walk test: .603 Merrilee Jansky  Balance Scale:  41  PATIENT SURVEYS:  FOTO 43 risk adjusted goal of 52   TODAY'S TREATMENT:                                                                                                                              DATE:   THEREX:   Right hip flexor stretch- standing - hold 30 sec x 3 Right hamstring standing stretch- hold 30 sec x 3 Sit to stand x 10 with VC for correct technique without UE  Standing calf raises 2 sets of 10 reps with 2 sec hold- VC to perform slowly Standing hip abduction- Alt LE x 10 reps  Added all above therex to a HEP   PATIENT EDUCATION: Education details: POC Person educated: Patient Education method: Explanation Education comprehension: verbalized understanding  HOME EXERCISE PROGRAM: Access Code: [email or web address] URL: [email or web address] Date: 04/23/2022 Prepared by: Sande Brothers  Exercises - Hip Flexor Stretch on Step  - 1 x daily - 7 x weekly - 4 sets - 20-30 sec hold - Standing Hamstring Stretch with Step  - 1 x daily - 7 x weekly - 4 sets - 10 reps - 20-30 hold - Sit to Stand with Arms Crossed  - 1 x daily - 3 x weekly - 3 sets - 10 reps - Standing Heel Raises  - 1 x daily - 3 x weekly - 3 sets - 10 reps - Standing Hip Abduction with Counter Support  - 1 x daily - 7 x weekly - 3 sets - 10 reps  GOALS: Goals reviewed with patient? Yes  SHORT TERM GOALS: Target date: 05/06/2022     Patient will be independent in home exercise program to improve strength/mobility for better functional independence with ADLs. Baseline: No HEP currently  Goal status: INITIAL  LONG TERM GOALS: Target date: 07/01/2022    1.  Patient (> 7 years old) will complete five times sit to stand test in < 15 seconds indicating an increased LE strength and improved balance. Baseline: 27.98sec Goal status: INITIAL  2.  Patient will increase FOTO score to equal to or greater than  52   to demonstrate statistically significant improvement in mobility and quality of  life.  Baseline: 43 Goal status: INITIAL   3.  Patient will increase Berg Balance score by > 6 points to demonstrate decreased fall risk during functional activities. Baseline: 41 Goal status: INITIAL  4.   Patient will increase 10 meter walk test to >.79ms as to improve gait speed for better community ambulation and to reduce fall risk. Baseline: .603 m/s Goal status: INITIAL  5.   Patient will increase 2 minute walk test distance to >260 feet for progression to community ambulator and improve gait ability Baseline: 190 ft Goal status: INITIAL   ASSESSMENT:  CLINICAL IMPRESSION: Treatment limited today due to patient late arrival and having speech therapy appointment immediately following PT. She was instructed in beginner LE stretching due to  increased stiffness in right hip/LE. She responded favorably to stretching and later to some beginner LE Strengthening without report of any increased pain.  Patient will benefit from physical therapy interventions to improve her lower extremity strength, improve her balance, decrease her risk of falls.  OBJECTIVE IMPAIRMENTS: Abnormal gait, decreased activity tolerance, decreased balance, decreased endurance, decreased mobility, difficulty walking, decreased strength, and hypomobility.   ACTIVITY LIMITATIONS: standing, squatting, bed mobility, and locomotion level  PARTICIPATION LIMITATIONS: cleaning, laundry, shopping, community activity, and yard work  PERSONAL FACTORS: 3+ comorbidities: HTN, MI, Bilateral LE pain  are also affecting patient's functional outcome.   REHAB POTENTIAL: Good  CLINICAL DECISION MAKING: Stable/uncomplicated  EVALUATION COMPLEXITY: Low  PLAN:  PT FREQUENCY: 1-2x/week  PT DURATION: 8 weeks  PLANNED INTERVENTIONS: Therapeutic exercises, Therapeutic activity, Neuromuscular re-education, Balance training, Gait training, Patient/Family education, Self Care, Joint mobilization, and Stair training  PLAN FOR  NEXT SESSION: Begin POC, provide and educate regarding HEP and importance of completion    Lewis Moccasin, PT 04/23/2022, 2:15 PM

## 2022-04-24 NOTE — Therapy (Signed)
OUTPATIENT PHYSICAL THERAPY NEURO TREATMENT   Patient Name: Tonya Keith MRN: [id number] DOB:10/28/1960, 61 y.o., female Today's Date: 04/25/2022   PCP: Sharyne Peach, MD  REFERRING PROVIDER: Vladimir Crofts, MD    PT End of Session - 04/25/22 [phone]     Visit Number 3    Number of Visits 16    Date for PT Re-Evaluation 06/03/22    Progress Note Due on Visit 10    PT Start Time 0915    PT Stop Time 0957    PT Time Calculation (min) 42 min    Equipment Utilized During Treatment Gait belt    Activity Tolerance Patient tolerated treatment well;Patient limited by pain               Past Medical History:  Diagnosis Date   Anemia    Anginal pain ([place])    Breast cancer ([place]) 2008   chemo mastectomy left   Cancer ([place])    breast   COPD (chronic obstructive pulmonary disease) ([place])    Coronary artery disease    History of left heart catheterization    w/ pci to RCA   Hx of migraine headaches    Hypertension    Myocardial infarction [facility]) 2010   Personal history of chemotherapy    Wears dentures    full upper (currently unable to wear)   Past Surgical History:  Procedure Laterality Date   ABDOMINAL HYSTERECTOMY     LEFT HEART CATH AND CORONARY ANGIOGRAPHY Left 09/07/2019   Procedure: LEFT HEART CATH AND CORONARY ANGIOGRAPHY;  Surgeon: Yolonda Kida, MD;  Location: [place] [facility];  Service: Cardiovascular;  Laterality: Left;   LESION EXCISION WITH COMPLEX REPAIR Right 12/04/2020   Procedure: Excision right upper lip intraoral lesion;  Surgeon: Clyde Canterbury, MD;  Location: [place];  Service: ENT;  Laterality: Right;  Latex   MASTECTOMY Left 2008   MINOR EXCISION OF ORAL LESION N/A 10/02/2020   Procedure: excision of right upper lip mass, intraoral;  Surgeon: Clyde Canterbury, MD;  Location: [place];  Service: ENT;  Laterality: N/A;  Latex   Patient Active Problem List   Diagnosis Date Noted   Hypothermia 12/03/2017   Bilateral lower  extremity pain 11/17/2017   Bilateral lower extremity edema 11/17/2017   Tobacco abuse 11/17/2017   Essential hypertension 11/17/2017   Hyperlipidemia 11/17/2017    ONSET DATE: 10/24/20  REFERRING DIAG: R26.89 (ICD-10-CM) - Imbalance   THERAPY DIAG:  Memory loss or impairment  Unsteadiness on feet  Muscle weakness (generalized)  Other abnormalities of gait and mobility  Rationale for Evaluation and Treatment: Rehabilitation  SUBJECTIVE:  SUBJECTIVE STATEMENT: Patient reports leg is stiff due to the cold. Otherwise doing okay per patient report.    Pt accompanied by: self  PERTINENT HISTORY: Pt reports imbalance that has started in the middle part of last year. Pt reports she has had 1 fall within the last 12 months where she fell and hit her head on the wall. She used to do a lot of walking but now she is afraid she will lose her balance and fall in the road if she attempts to go for a walk. Pt want to ensure she has the strength to take care of herself as she gets older. Pt wants to improve her balance with therapy and also wants to improve her strength so she can get stronger.   PAIN:  Are you having pain? Yes: NPRS scale: 5/10 Pain location: legs R > L with hip being primary pain site, pt does report generalized pain in her Les bilaterally  Pain description: Achy, burning Aggravating factors: sleeping, walking prolonged distances, standing too long Relieving factors: rest, movement, elevation   PRECAUTIONS: Fall  WEIGHT BEARING RESTRICTIONS: No  FALLS: Has patient fallen in last 6 months? Yes. Number of falls 1  LIVING ENVIRONMENT: Lives with: lives with their family Lives in: House/apartment Stairs: No Has following equipment at home: None  PLOF: Independent  PATIENT GOALS:  improve balance, mobility and strength   OBJECTIVE:   DIAGNOSTIC FINDINGS:  MRI Brain impression. Age appropriate volume loss without lobar predominance.   COGNITION: Overall cognitive status: History of cognitive impairments - at baseline- memory therapy with [facility] currently   SENSATION: WFL  COORDINATION: Pin with heel to shin usign R LE, no s/s of incoordination  EDEMA:  None   MPOSTURE: No Significant postural limitations  LOWER EXTREMITY ROM:   R hip limited external rotaion although no measured, other joints WNL in LE   Active  Right Eval Left Eval  Hip flexion    Hip extension    Hip abduction    Hip adduction    Hip internal rotation    Hip external rotation    Knee flexion    Knee extension    Ankle dorsiflexion    Ankle plantarflexion    Ankle inversion    Ankle eversion     (Blank rows = not tested)  LOWER EXTREMITY MMT:    MMT Right Eval Left Eval  Hip flexion 3+* 4  Hip extension    Hip abduction 3+ 4  Hip adduction 3+ 4  Hip internal rotation    Hip external rotation    Knee flexion 4- 4  Knee extension 3+* 4  Ankle dorsiflexion 3+ 4  Ankle plantarflexion 4+ 4+  Ankle inversion    Ankle eversion    (Blank rows = not tested)  BED MOBILITY:  Pt has pain in her right hip with pain mobility.   TRANSFERS: Assistive device utilized: None  Sit to stand: Modified independence Stand to sit: Complete Independence Chair to chair: Modified independence Floor:  not tested   GAIT: Gait pattern: decreased step length- Left, decreased stance time- Right, scissoring, and antalgic Distance walked: 180 feet  Assistive device utilized: None Level of assistance: Modified independence Comments: intermittent scissoring gait, fatigued quickly with limitation by R hip pain.   FUNCTIONAL TESTS:  5 times sit to stand: 27.98 sec  6 minute walk test: unable to copmlete secondary to hip pain; 2 minute walk test 190 feet  10 meter walk test: .603 Berg  Balance  Scale: 41  PATIENT SURVEYS:  FOTO 43 risk adjusted goal of 52   TODAY'S TREATMENT:                                                                                                                              DATE:   THEREX:  INTERVAL NUSTEP- UE/LE- seat and arm level at 7- resistance ranging from L1- L2 x for muscle strengthening and ROM to decrease stiffness. Total distance= 0.14 mi Step up 2.5# AW on 6" step x 10 reps alt LE  without UE support.  Side step over 1/2 foam (left to right and back) with 2.5# AW x 12 reps each.  Forward step up/over 1/2 foam then backward step with 2.5 AW x 12 reps (no UE support and No LOB)  Sit to stand x 10 with VC for correct technique without UE  Standing calf raisesx 15 reps with 2 sec hold- VC to perform slowly Donkey kick (stand hip ext/knee flexed with 2.5 # AW) x 12 reps each LE Ambulation in clinic around 360 feet without AD and using 2.5# AW- no LOB- short reciprocal steps.    PATIENT EDUCATION: Education details: POC Person educated: Patient Education method: Explanation Education comprehension: verbalized understanding  HOME EXERCISE PROGRAM: Access Code: [email or web address] URL: [email or web address] Date: 04/23/2022 Prepared by: Sande Brothers  Exercises - Hip Flexor Stretch on Step  - 1 x daily - 7 x weekly - 4 sets - 20-30 sec hold - Standing Hamstring Stretch with Step  - 1 x daily - 7 x weekly - 4 sets - 10 reps - 20-30 hold - Sit to Stand with Arms Crossed  - 1 x daily - 3 x weekly - 3 sets - 10 reps - Standing Heel Raises  - 1 x daily - 3 x weekly - 3 sets - 10 reps - Standing Hip Abduction with Counter Support  - 1 x daily - 7 x weekly - 3 sets - 10 reps  GOALS: Goals reviewed with patient? Yes  SHORT TERM GOALS: Target date: 05/06/2022     Patient will be independent in home exercise program to improve strength/mobility for better functional independence with ADLs. Baseline: No HEP currently  Goal  status: INITIAL  LONG TERM GOALS: Target date: 07/01/2022    1.  Patient (> 31 years old) will complete five times sit to stand test in < 15 seconds indicating an increased LE strength and improved balance. Baseline: 27.98sec Goal status: INITIAL  2.  Patient will increase FOTO score to equal to or greater than  52   to demonstrate statistically significant improvement in mobility and quality of life.  Baseline: 43 Goal status: INITIAL   3.  Patient will increase Berg Balance score by > 6 points to demonstrate decreased fall risk during functional activities. Baseline: 41 Goal status: INITIAL  4.   Patient will increase 10 meter walk test to >.46ms as to improve gait speed for better  community ambulation and to reduce fall risk. Baseline: .603 m/s Goal status: INITIAL  5.   Patient will increase 2 minute walk test distance to >260 feet for progression to community ambulator and improve gait ability Baseline: 190 ft Goal status: INITIAL   ASSESSMENT:  CLINICAL IMPRESSION: Patient arrived with good motivation today. She was stiff initially and responded fairly to Nustep- Reported  "hard" on right LE. Otherwise patient responded very well to therex- able to perform with ankle weight resistance and no complaint of further hip pain- only fatigue. Only initial VC and visual demo for correct technique- patient quickly responds with good return of demonstration. Patient will benefit from physical therapy interventions to improve her lower extremity strength, improve her balance, decrease her risk of falls.  OBJECTIVE IMPAIRMENTS: Abnormal gait, decreased activity tolerance, decreased balance, decreased endurance, decreased mobility, difficulty walking, decreased strength, and hypomobility.   ACTIVITY LIMITATIONS: standing, squatting, bed mobility, and locomotion level  PARTICIPATION LIMITATIONS: cleaning, laundry, shopping, community activity, and yard work  PERSONAL FACTORS: 3+  comorbidities: HTN, MI, Bilateral LE pain  are also affecting patient's functional outcome.   REHAB POTENTIAL: Good  CLINICAL DECISION MAKING: Stable/uncomplicated  EVALUATION COMPLEXITY: Low  PLAN:  PT FREQUENCY: 1-2x/week  PT DURATION: 8 weeks  PLANNED INTERVENTIONS: Therapeutic exercises, Therapeutic activity, Neuromuscular re-education, Balance training, Gait training, Patient/Family education, Self Care, Joint mobilization, and Stair training  PLAN FOR NEXT SESSION: Begin POC, provide and educate regarding HEP and importance of completion    Lewis Moccasin, PT 04/25/2022, 9:59 AM

## 2022-04-25 ENCOUNTER — Ambulatory Visit: Payer: Medicare Other | Attending: Neurology

## 2022-04-25 ENCOUNTER — Ambulatory Visit: Payer: Medicare Other | Admitting: Speech Pathology

## 2022-04-25 DIAGNOSIS — R413 Other amnesia: Secondary | ICD-10-CM | POA: Insufficient documentation

## 2022-04-25 DIAGNOSIS — R41841 Cognitive communication deficit: Secondary | ICD-10-CM | POA: Diagnosis present

## 2022-04-25 DIAGNOSIS — R2681 Unsteadiness on feet: Secondary | ICD-10-CM | POA: Insufficient documentation

## 2022-04-25 DIAGNOSIS — M6281 Muscle weakness (generalized): Secondary | ICD-10-CM | POA: Insufficient documentation

## 2022-04-25 DIAGNOSIS — R4189 Other symptoms and signs involving cognitive functions and awareness: Secondary | ICD-10-CM

## 2022-04-25 DIAGNOSIS — R2689 Other abnormalities of gait and mobility: Secondary | ICD-10-CM | POA: Insufficient documentation

## 2022-04-28 ENCOUNTER — Ambulatory Visit: Payer: Medicare Other

## 2022-04-28 ENCOUNTER — Ambulatory Visit: Payer: Medicare Other | Admitting: Speech Pathology

## 2022-04-28 DIAGNOSIS — R413 Other amnesia: Secondary | ICD-10-CM

## 2022-04-28 DIAGNOSIS — R2689 Other abnormalities of gait and mobility: Secondary | ICD-10-CM

## 2022-04-28 DIAGNOSIS — M6281 Muscle weakness (generalized): Secondary | ICD-10-CM

## 2022-04-28 DIAGNOSIS — R4189 Other symptoms and signs involving cognitive functions and awareness: Secondary | ICD-10-CM

## 2022-04-28 DIAGNOSIS — R2681 Unsteadiness on feet: Secondary | ICD-10-CM

## 2022-04-28 DIAGNOSIS — R41841 Cognitive communication deficit: Secondary | ICD-10-CM

## 2022-04-28 NOTE — Therapy (Signed)
OUTPATIENT PHYSICAL THERAPY NEURO TREATMENT   Patient Name: Tonya Keith MRN: [id number] DOB:[birth year], 61 y.o., female Today's Date: 04/28/2022   PCP: Sharyne Peach, MD  REFERRING PROVIDER: Vladimir Crofts, MD    PT End of Session - 04/28/22 1020     Visit Number 4    Number of Visits 16    Date for PT Re-Evaluation 06/03/22    Progress Note Due on Visit 10    PT Start Time 1020    PT Stop Time 1100    PT Time Calculation (min) 40 min    Equipment Utilized During Treatment Gait belt    Activity Tolerance Patient tolerated treatment well;Patient limited by pain                Past Medical History:  Diagnosis Date   Anemia    Anginal pain ([place])    Breast cancer ([place]) 2008   chemo mastectomy left   Cancer ([place])    breast   COPD (chronic obstructive pulmonary disease) ([place])    Coronary artery disease    History of left heart catheterization    w/ pci to RCA   Hx of migraine headaches    Hypertension    Myocardial infarction [facility]) 2010   Personal history of chemotherapy    Wears dentures    full upper (currently unable to wear)   Past Surgical History:  Procedure Laterality Date   ABDOMINAL HYSTERECTOMY     LEFT HEART CATH AND CORONARY ANGIOGRAPHY Left 09/07/2019   Procedure: LEFT HEART CATH AND CORONARY ANGIOGRAPHY;  Surgeon: Yolonda Kida, MD;  Location: [place] [facility];  Service: Cardiovascular;  Laterality: Left;   LESION EXCISION WITH COMPLEX REPAIR Right 12/04/2020   Procedure: Excision right upper lip intraoral lesion;  Surgeon: Clyde Canterbury, MD;  Location: [place];  Service: ENT;  Laterality: Right;  Latex   MASTECTOMY Left 2008   MINOR EXCISION OF ORAL LESION N/A 10/02/2020   Procedure: excision of right upper lip mass, intraoral;  Surgeon: Clyde Canterbury, MD;  Location: [place];  Service: ENT;  Laterality: N/A;  Latex   Patient Active Problem List   Diagnosis Date Noted   Hypothermia 12/03/2017   Bilateral lower  extremity pain 11/17/2017   Bilateral lower extremity edema 11/17/2017   Tobacco abuse 11/17/2017   Essential hypertension 11/17/2017   Hyperlipidemia 11/17/2017    ONSET DATE: 10/24/20  REFERRING DIAG: R26.89 (ICD-10-CM) - Imbalance   THERAPY DIAG:  Unsteadiness on feet  Muscle weakness (generalized)  Other abnormalities of gait and mobility  Memory loss or impairment  Cognitive communication deficit  Cognitive impairment  Rationale for Evaluation and Treatment: Rehabilitation  SUBJECTIVE:  SUBJECTIVE STATEMENT:  Pt reports she does not like the morning.  She states that she is doing her exercises and did one of them this morning.  Pt accompanied by: self  PERTINENT HISTORY:  Pt reports imbalance that has started in the middle part of last year. Pt reports she has had 1 fall within the last 12 months where she fell and hit her head on the wall. She used to do a lot of walking but now she is afraid she will lose her balance and fall in the road if she attempts to go for a walk. Pt want to ensure she has the strength to take care of herself as she gets older. Pt wants to improve her balance with therapy and also wants to improve her strength so she can get stronger.   PAIN:  Are you having pain? Yes: NPRS scale: 6/10 Pain location: legs R > L with hip being primary pain site, pt does report generalized pain in her LE's bilaterally  Pain description: Achy, burning Aggravating factors: sleeping, walking prolonged distances, standing too long Relieving factors: rest, movement, elevation   PRECAUTIONS: Fall  WEIGHT BEARING RESTRICTIONS: No  FALLS: Has patient fallen in last 6 months? Yes. Number of falls 1  LIVING ENVIRONMENT: Lives with: lives with their family Lives in:  House/apartment Stairs: No Has following equipment at home: None  PLOF: Independent  PATIENT GOALS: improve balance, mobility and strength   OBJECTIVE:   DIAGNOSTIC FINDINGS:  MRI Brain impression. Age appropriate volume loss without lobar predominance.   COGNITION: Overall cognitive status: History of cognitive impairments - at baseline- memory therapy with [facility] currently   SENSATION: WFL  COORDINATION: Pin with heel to shin usign R LE, no s/s of incoordination  EDEMA:  None   MPOSTURE: No Significant postural limitations  LOWER EXTREMITY ROM:   R hip limited external rotaion although no measured, other joints WNL in LE   Active  Right Eval Left Eval  Hip flexion    Hip extension    Hip abduction    Hip adduction    Hip internal rotation    Hip external rotation    Knee flexion    Knee extension    Ankle dorsiflexion    Ankle plantarflexion    Ankle inversion    Ankle eversion     (Blank rows = not tested)  LOWER EXTREMITY MMT:    MMT Right Eval Left Eval  Hip flexion 3+* 4  Hip extension    Hip abduction 3+ 4  Hip adduction 3+ 4  Hip internal rotation    Hip external rotation    Knee flexion 4- 4  Knee extension 3+* 4  Ankle dorsiflexion 3+ 4  Ankle plantarflexion 4+ 4+  Ankle inversion    Ankle eversion    (Blank rows = not tested)  BED MOBILITY:  Pt has pain in her right hip with pain mobility.   TRANSFERS: Assistive device utilized: None  Sit to stand: Modified independence Stand to sit: Complete Independence Chair to chair: Modified independence Floor:  not tested   GAIT: Gait pattern: decreased step length- Left, decreased stance time- Right, scissoring, and antalgic Distance walked: 180 feet  Assistive device utilized: None Level of assistance: Modified independence Comments: intermittent scissoring gait, fatigued quickly with limitation by R hip pain.   FUNCTIONAL TESTS:  5 times sit to stand: 27.98 sec  6 minute walk test:  unable to copmlete secondary to hip pain; 2 minute walk test 190  feet  10 meter walk test: .603 Berg Balance Scale: 41  PATIENT SURVEYS:  FOTO 43 risk adjusted goal of 52   TODAY'S TREATMENT:                                                                                                                               TherEx:   Forward step up/over 1/2 foam then backward step with 2.5 AW x 12 reps (no UE support and No LOB)  Sit to stand 2x10 with VC for correct technique without UE  Standing calf raises with 2.5# AW, verbal and tactile cuing for proper form, 2x15 each  Standing hip abduction with 2.5# AW donned, 2x10 each LE Standing hip extension with 2.5# AW donned, 2x10 each LE Donkey kick (stand hip ext/knee flexed with 2.5 # AW) x 12 reps each LE Ambulation in clinic around 480 feet without AD and using 2.5# AW, increased hip flexion during steps Seated hamstring curls with BTB resistance, x12 each LE Seated hip abduction with BTB resistance, 2x12 each LE   PATIENT EDUCATION: Education details: POC Person educated: Patient Education method: Explanation Education comprehension: verbalized understanding  HOME EXERCISE PROGRAM: Access Code: [email or web address] URL: [email or web address] Date: 04/23/2022 Prepared by: Sande Brothers  Exercises - Hip Flexor Stretch on Step  - 1 x daily - 7 x weekly - 4 sets - 20-30 sec hold - Standing Hamstring Stretch with Step  - 1 x daily - 7 x weekly - 4 sets - 10 reps - 20-30 hold - Sit to Stand with Arms Crossed  - 1 x daily - 3 x weekly - 3 sets - 10 reps - Standing Heel Raises  - 1 x daily - 3 x weekly - 3 sets - 10 reps - Standing Hip Abduction with Counter Support  - 1 x daily - 7 x weekly - 3 sets - 10 reps  GOALS: Goals reviewed with patient? Yes  SHORT TERM GOALS: Target date: 05/06/2022  Patient will be independent in home exercise program to improve strength/mobility for better functional independence with  ADLs. Baseline: No HEP currently  Goal status: INITIAL   LONG TERM GOALS: Target date: 07/01/2022  1.  Patient (> 63 years old) will complete five times sit to stand test in < 15 seconds indicating an increased LE strength and improved balance. Baseline: 27.98sec Goal status: INITIAL  2.  Patient will increase FOTO score to equal to or greater than  52   to demonstrate statistically significant improvement in mobility and quality of life.  Baseline: 43 Goal status: INITIAL   3.  Patient will increase Berg Balance score by > 6 points to demonstrate decreased fall risk during functional activities. Baseline: 41 Goal status: INITIAL  4.   Patient will increase 10 meter walk test to >.68ms as to improve gait speed for better community ambulation and to reduce fall risk. Baseline: .603 m/s Goal status: INITIAL  5.   Patient will  increase 2 minute walk test distance to >260 feet for progression to community ambulator and improve gait ability Baseline: 190 ft Goal status: INITIAL   ASSESSMENT:  CLINICAL IMPRESSION:  Pt put forth great effort throughout the treatment session today.  Pt able to increase weight resistance and is doing well with all tasks given.  Pt self-reported stiffness due to appointment being early in the morning, however noted her joints to loosen up with activity.  Pt noted to have deficits with hamstring activation with resisted hamstring curls, and just generalized weakness noted in the hamstrings/quads.  Will continue to improve this as pain allows.   Pt will continue to benefit from skilled therapy to address remaining deficits in order to improve overall QoL and return to PLOF.    OBJECTIVE IMPAIRMENTS: Abnormal gait, decreased activity tolerance, decreased balance, decreased endurance, decreased mobility, difficulty walking, decreased strength, and hypomobility.   ACTIVITY LIMITATIONS: standing, squatting, bed mobility, and locomotion level  PARTICIPATION  LIMITATIONS: cleaning, laundry, shopping, community activity, and yard work  PERSONAL FACTORS: 3+ comorbidities: HTN, MI, Bilateral LE pain  are also affecting patient's functional outcome.   REHAB POTENTIAL: Good  CLINICAL DECISION MAKING: Stable/uncomplicated  EVALUATION COMPLEXITY: Low  PLAN:  PT FREQUENCY: 1-2x/week  PT DURATION: 8 weeks  PLANNED INTERVENTIONS: Therapeutic exercises, Therapeutic activity, Neuromuscular re-education, Balance training, Gait training, Patient/Family education, Self Care, Joint mobilization, and Stair training  PLAN FOR NEXT SESSION: Begin POC, provide and educate regarding HEP and importance of completion    Gwenlyn Saran, PT, DPT Physical Therapist- [facility]  04/28/22, 1:05 PM

## 2022-04-29 NOTE — Therapy (Signed)
OUTPATIENT SPEECH LANGUAGE PATHOLOGY TREATMENT NOTE   Patient Name: Tonya Keith MRN: [id number] DOB:06/08/1960, 61 y.o., female Today's Date: 04/29/2022  PCP: Salome Holmes, MD REFERRING PROVIDER: Jennings Books, MD  END OF SESSION:   End of Session - 04/29/22 1128     Visit Number 3    Number of Visits 17    Date for SLP Re-Evaluation 06/03/22    Authorization Type UHC Medicare    Authorization Time Period 04/08/2022 thru 06/03/2022    Progress Note Due on Visit 10    SLP Start Time 1000    SLP Stop Time  1100    SLP Time Calculation (min) 60 min    Activity Tolerance Patient tolerated treatment well             Past Medical History:  Diagnosis Date   Anemia    Anginal pain ([place])    Breast cancer ([place]) 2008   chemo mastectomy left   Cancer ([place])    breast   COPD (chronic obstructive pulmonary disease) ([place])    Coronary artery disease    History of left heart catheterization    w/ pci to RCA   Hx of migraine headaches    Hypertension    Myocardial infarction [facility]) 2010   Personal history of chemotherapy    Wears dentures    full upper (currently unable to wear)   Past Surgical History:  Procedure Laterality Date   ABDOMINAL HYSTERECTOMY     LEFT HEART CATH AND CORONARY ANGIOGRAPHY Left 09/07/2019   Procedure: LEFT HEART CATH AND CORONARY ANGIOGRAPHY;  Surgeon: Yolonda Kida, MD;  Location: [place] [facility];  Service: Cardiovascular;  Laterality: Left;   LESION EXCISION WITH COMPLEX REPAIR Right 12/04/2020   Procedure: Excision right upper lip intraoral lesion;  Surgeon: Clyde Canterbury, MD;  Location: [place];  Service: ENT;  Laterality: Right;  Latex   MASTECTOMY Left 2008   MINOR EXCISION OF ORAL LESION N/A 10/02/2020   Procedure: excision of right upper lip mass, intraoral;  Surgeon: Clyde Canterbury, MD;  Location: [facility] [place];  Service: ENT;  Laterality: N/A;  Latex   Patient Active Problem List   Diagnosis Date Noted   Hypothermia  12/03/2017   Bilateral lower extremity pain 11/17/2017   Bilateral lower extremity edema 11/17/2017   Tobacco abuse 11/17/2017   Essential hypertension 11/17/2017   Hyperlipidemia 11/17/2017    ONSET DATE: referral date 02/18/2022; memory loss dating back to 2022   REFERRING DIAG: R41.3 (ICD-10-CM) - Memory loss or impairment    PERTINENT HISTORY:  Memory loss -  Onset has been gradual since 2022 Headaches - in a patient with history of migraines since early age years with increased headache in past several years + occasional pulsatile tinnitus, valsalva induced headache + partially empty sella -concerning for component of intracranial hypertension     DIAGNOSTIC FINDINGS:    MRV Head Without Contrast 03/14/2022 -  1. Age appropriate volume loss without lobar predominance. See  neuroquant analysis for additional findings.  2. Partially empty sella and mild tonsillar ectopia measuring up to  7 mm. Correlate with symptoms of idiopathic intracranial  hypertension.  3. Left sphenoid sinusitis.  4. No evidence of dural venous sinus thromobosis.    Cerebellar Tonsillar Ectopia 24m    SLUMS -  02/13/2022 was 20/30 Difficulty with mental math problem solving; clock drawing and attention (digits backwards) THERAPY DIAG:  Cognitive communication deficit  Memory loss or impairment  Cognitive impairment  Rationale for Evaluation and Treatment Rehabilitation  SUBJECTIVE: pt pleasant, concerned about transportation  Pt accompanied by: self  PAIN:  Are you having pain? No  PATIENT GOALS: pt unable to state  OBJECTIVE:   TODAY'S TREATMENT:  Skilled treatment session focused on pt's cognitive impairment, specifically her memory loss. SLP facilitated the session by providing the following activities:  Pt voiced concern about her transportation. While looking at her therapy calendar, she states that after the next 4 sessions, she will have used up all of her provided  transportation. Pt call and get specific details with SLP to follow up with her at next appt   Given verbal directions, pt able to navigate hospital to locate multiple locations with rare Min A cues for recall. Pt with improved selective attention to task.    PATIENT EDUCATION: Education details: external memory aids Person educated: Patient Education method: Explanation Education comprehension: verbalized understanding and needs further education  HOME EXERCISE PROGRAM:   Complete seasonal word searches  GOALS: Goals reviewed with patient? Yes   SHORT TERM GOALS: Target date: 10 sessions   Given a functional problem (ie., social problem, multi-step math word problem, logic puzzle) or scenario from daily life, patient will demonstrate problem solving with > 80% accuracy and rare Min A.  Baseline: Goal status: INITIAL   Pt will use strategies to improve memory for important information with 75% acc. with minimal assistance (ie., white board, daily planner/calendar, Apps on phone).  Baseline: Goal status: INITIAL     LONG TERM GOALS: Target date: 06/03/2022 Given a functional problem a scenario from daily life, patient will demonstrate error awareness and correct errors independently with at least 90% acc.              Baseline:  Goal status: INITIAL   2.  Pt will use strategies to improve memory for important information with 90% acc. Independently(ie., white board, daily planner/calendar, Apps on phone).  Baseline:  Goal status: INITIAL  ASSESSMENT:  CLINICAL IMPRESSION:  Pt presents with improving recall of information and continues to be eager to employ internal and external memory aids.   OBJECTIVE IMPAIRMENTS include memory and executive functioning. These impairments are limiting patient from managing medications, managing appointments, managing finances, household responsibilities, and ADLs/IADLs. Factors affecting potential to achieve goals and functional outcome  are medical prognosis, previous level of function, severity of impairments, financial resources, and family/community support. Patient will benefit from skilled SLP services to address above impairments and improve overall function.  REHAB POTENTIAL: Good  PLAN: SLP FREQUENCY: 1-2x/week  SLP DURATION: 8 weeks  PLANNED INTERVENTIONS: Environmental controls, Cognitive reorganization, Internal/external aids, Functional tasks, SLP instruction and feedback, Compensatory strategies, and Patient/family education   Daniella Dewberry B. Rutherford Nail, M.S., CCC-SLP, Mining engineer Certified Brain Injury [place]  [place] Office [phone] Ascom [phone] Fax [phone]

## 2022-05-01 ENCOUNTER — Ambulatory Visit: Payer: Medicare Other | Admitting: Speech Pathology

## 2022-05-01 DIAGNOSIS — R413 Other amnesia: Secondary | ICD-10-CM | POA: Diagnosis not present

## 2022-05-01 DIAGNOSIS — R4189 Other symptoms and signs involving cognitive functions and awareness: Secondary | ICD-10-CM

## 2022-05-01 DIAGNOSIS — R41841 Cognitive communication deficit: Secondary | ICD-10-CM

## 2022-05-02 ENCOUNTER — Other Ambulatory Visit: Payer: Medicare Other

## 2022-05-05 ENCOUNTER — Ambulatory Visit: Payer: Medicare Other | Admitting: Speech Pathology

## 2022-05-06 ENCOUNTER — Ambulatory Visit
Admission: RE | Admit: 2022-05-06 | Discharge: 2022-05-06 | Disposition: A | Discharge status: Home / Self Care | Payer: Medicare Other | Source: Ambulatory Visit | Attending: Specialist | Admitting: Specialist

## 2022-05-06 DIAGNOSIS — R911 Solitary pulmonary nodule: Secondary | ICD-10-CM | POA: Diagnosis not present

## 2022-05-06 NOTE — Therapy (Signed)
OUTPATIENT SPEECH LANGUAGE PATHOLOGY TREATMENT NOTE   Patient Name: Tonya Keith MRN: [id number] DOB:1960/08/25, 61 y.o., female Today's Date: 05/06/2022  PCP: Salome Holmes, MD REFERRING PROVIDER: Jennings Books, MD  END OF SESSION:   End of Session - 05/06/22 1403     Visit Number 4    Number of Visits 17    Date for SLP Re-Evaluation 06/03/22    Authorization Type UHC Medicare    Authorization Time Period 04/08/2022 thru 06/03/2022    Progress Note Due on Visit 10    SLP Start Time 1500    SLP Stop Time  1600    SLP Time Calculation (min) 60 min    Activity Tolerance Patient tolerated treatment well             Past Medical History:  Diagnosis Date   Anemia    Anginal pain ([place])    Breast cancer ([place]) 2008   chemo mastectomy left   Cancer ([place])    breast   COPD (chronic obstructive pulmonary disease) ([place])    Coronary artery disease    History of left heart catheterization    w/ pci to RCA   Hx of migraine headaches    Hypertension    Myocardial infarction [facility]) 2010   Personal history of chemotherapy    Wears dentures    full upper (currently unable to wear)   Past Surgical History:  Procedure Laterality Date   ABDOMINAL HYSTERECTOMY     LEFT HEART CATH AND CORONARY ANGIOGRAPHY Left 09/07/2019   Procedure: LEFT HEART CATH AND CORONARY ANGIOGRAPHY;  Surgeon: Yolonda Kida, MD;  Location: [place] [facility];  Service: Cardiovascular;  Laterality: Left;   LESION EXCISION WITH COMPLEX REPAIR Right 12/04/2020   Procedure: Excision right upper lip intraoral lesion;  Surgeon: Clyde Canterbury, MD;  Location: [place];  Service: ENT;  Laterality: Right;  Latex   MASTECTOMY Left 2008   MINOR EXCISION OF ORAL LESION N/A 10/02/2020   Procedure: excision of right upper lip mass, intraoral;  Surgeon: Clyde Canterbury, MD;  Location: [place];  Service: ENT;  Laterality: N/A;  Latex   Patient Active Problem List   Diagnosis Date Noted   Hypothermia  12/03/2017   Bilateral lower extremity pain 11/17/2017   Bilateral lower extremity edema 11/17/2017   Tobacco abuse 11/17/2017   Essential hypertension 11/17/2017   Hyperlipidemia 11/17/2017    ONSET DATE: referral date 02/18/2022; memory loss dating back to 2022   REFERRING DIAG: R41.3 (ICD-10-CM) - Memory loss or impairment    PERTINENT HISTORY:  Memory loss -  Onset has been gradual since 2022 Headaches - in a patient with history of migraines since early age years with increased headache in past several years + occasional pulsatile tinnitus, valsalva induced headache + partially empty sella -concerning for component of intracranial hypertension     DIAGNOSTIC FINDINGS:    MRV Head Without Contrast 03/14/2022 -  1. Age appropriate volume loss without lobar predominance. See  neuroquant analysis for additional findings.  2. Partially empty sella and mild tonsillar ectopia measuring up to  7 mm. Correlate with symptoms of idiopathic intracranial  hypertension.  3. Left sphenoid sinusitis.  4. No evidence of dural venous sinus thromobosis.    Cerebellar Tonsillar Ectopia 81m    SLUMS -  02/13/2022 was 20/30 Difficulty with mental math problem solving; clock drawing and attention (digits backwards) THERAPY DIAG:  Cognitive communication deficit  Memory loss or impairment  Cognitive impairment  Rationale for Evaluation and Treatment Rehabilitation  SUBJECTIVE: pt pleasant, concerned about transportation  Pt accompanied by: self  PAIN:  Are you having pain? No  PATIENT GOALS: pt unable to state  OBJECTIVE:   TODAY'S TREATMENT:  Skilled treatment session focused on pt's cognitive impairment, specifically her memory loss. SLP facilitated the session by providing the following activities:  Pt presents with disorganized explanation of current transportation issue. However, pt was able to problem solve how to get needed information and with that information, SLP was  able to gain understanding of situation. SLP facilitated communicating information to front desk for scheduling purposes.   Pt was Mod I when completing seasonal semi-complex problem solving activity.   PATIENT EDUCATION: Education details: external memory aids Person educated: Patient Education method: Explanation Education comprehension: verbalized understanding and needs further education  HOME EXERCISE PROGRAM:   Complete seasonal word searches  GOALS: Goals reviewed with patient? Yes   SHORT TERM GOALS: Target date: 10 sessions   Given a functional problem (ie., social problem, multi-step math word problem, logic puzzle) or scenario from daily life, patient will demonstrate problem solving with > 80% accuracy and rare Min A.  Baseline: Goal status: INITIAL   Pt will use strategies to improve memory for important information with 75% acc. with minimal assistance (ie., white board, daily planner/calendar, Apps on phone).  Baseline: Goal status: INITIAL     LONG TERM GOALS: Target date: 06/03/2022 Given a functional problem a scenario from daily life, patient will demonstrate error awareness and correct errors independently with at least 90% acc.              Baseline:  Goal status: INITIAL   2.  Pt will use strategies to improve memory for important information with 90% acc. Independently(ie., white board, daily planner/calendar, Apps on phone).  Baseline:  Goal status: INITIAL  ASSESSMENT:  CLINICAL IMPRESSION:  Pt presents with improving recall of information and continues to be eager to employ internal and external memory aids.   OBJECTIVE IMPAIRMENTS include memory and executive functioning. These impairments are limiting patient from managing medications, managing appointments, managing finances, household responsibilities, and ADLs/IADLs. Factors affecting potential to achieve goals and functional outcome are medical prognosis, previous level of function, severity  of impairments, financial resources, and family/community support. Patient will benefit from skilled SLP services to address above impairments and improve overall function.  REHAB POTENTIAL: Good  PLAN: SLP FREQUENCY: 1-2x/week  SLP DURATION: 8 weeks  PLANNED INTERVENTIONS: Environmental controls, Cognitive reorganization, Internal/external aids, Functional tasks, SLP instruction and feedback, Compensatory strategies, and Patient/family education   Santita Hunsberger B. Rutherford Nail, M.S., CCC-SLP, Mining engineer Certified Brain Injury [place]  [place] Office [phone] Ascom [phone] Fax [phone]

## 2022-05-07 ENCOUNTER — Ambulatory Visit: Payer: Medicare Other

## 2022-05-07 ENCOUNTER — Ambulatory Visit: Payer: Medicare Other | Admitting: Speech Pathology

## 2022-05-08 ENCOUNTER — Ambulatory Visit: Payer: Medicare Other

## 2022-05-08 DIAGNOSIS — R413 Other amnesia: Secondary | ICD-10-CM | POA: Diagnosis not present

## 2022-05-08 DIAGNOSIS — M6281 Muscle weakness (generalized): Secondary | ICD-10-CM

## 2022-05-08 NOTE — Therapy (Signed)
OUTPATIENT PHYSICAL THERAPY NEURO TREATMENT   Patient Name: Tonya Keith MRN: [id number] DOB:1961-03-05, 61 y.o., female Today's Date: 05/08/2022   PCP: Sharyne Peach, MD  REFERRING PROVIDER: Vladimir Crofts, MD    PT End of Session - 05/08/22 1705     Visit Number 5    Number of Visits 16    Date for PT Re-Evaluation 06/03/22    Progress Note Due on Visit 10    PT Start Time 1603    PT Stop Time 1645    PT Time Calculation (min) 42 min    Equipment Utilized During Treatment Gait belt    Activity Tolerance Patient tolerated treatment well;No increased pain    Behavior During Therapy WFL for tasks assessed/performed                Past Medical History:  Diagnosis Date   Anemia    Anginal pain ([place])    Breast cancer ([place]) 2008   chemo mastectomy left   Cancer ([facility])    breast   COPD (chronic obstructive pulmonary disease) ([place])    Coronary artery disease    History of left heart catheterization    w/ pci to RCA   Hx of migraine headaches    Hypertension    Myocardial infarction [facility]) 2010   Personal history of chemotherapy    Wears dentures    full upper (currently unable to wear)   Past Surgical History:  Procedure Laterality Date   ABDOMINAL HYSTERECTOMY     LEFT HEART CATH AND CORONARY ANGIOGRAPHY Left 09/07/2019   Procedure: LEFT HEART CATH AND CORONARY ANGIOGRAPHY;  Surgeon: Yolonda Kida, MD;  Location: [place] [facility];  Service: Cardiovascular;  Laterality: Left;   LESION EXCISION WITH COMPLEX REPAIR Right 12/04/2020   Procedure: Excision right upper lip intraoral lesion;  Surgeon: Clyde Canterbury, MD;  Location: [place];  Service: ENT;  Laterality: Right;  Latex   MASTECTOMY Left 2008   MINOR EXCISION OF ORAL LESION N/A 10/02/2020   Procedure: excision of right upper lip mass, intraoral;  Surgeon: Clyde Canterbury, MD;  Location: [place];  Service: ENT;  Laterality: N/A;  Latex   Patient Active Problem List   Diagnosis  Date Noted   Hypothermia 12/03/2017   Bilateral lower extremity pain 11/17/2017   Bilateral lower extremity edema 11/17/2017   Tobacco abuse 11/17/2017   Essential hypertension 11/17/2017   Hyperlipidemia 11/17/2017    ONSET DATE: 10/24/20  REFERRING DIAG: R26.89 (ICD-10-CM) - Imbalance   THERAPY DIAG:  Muscle weakness (generalized)  Rationale for Evaluation and Treatment: Rehabilitation  SUBJECTIVE:  SUBJECTIVE STATEMENT:  Pt reports no pain, no stumbles/falls or LOB. She has been doing her HEP. She reports no other concerns.  Pt accompanied by: self  PERTINENT HISTORY:  Pt reports imbalance that has started in the middle part of last year. Pt reports she has had 1 fall within the last 12 months where she fell and hit her head on the wall. She used to do a lot of walking but now she is afraid she will lose her balance and fall in the road if she attempts to go for a walk. Pt want to ensure she has the strength to take care of herself as she gets older. Pt wants to improve her balance with therapy and also wants to improve her strength so she can get stronger.   PAIN:  Are you having pain? Yes: NPRS scale: 6/10 Pain location: legs R > L with hip being primary pain site, pt does report generalized pain in her LE's bilaterally  Pain description: Achy, burning Aggravating factors: sleeping, walking prolonged distances, standing too long Relieving factors: rest, movement, elevation   PRECAUTIONS: Fall  WEIGHT BEARING RESTRICTIONS: No  FALLS: Has patient fallen in last 6 months? Yes. Number of falls 1  LIVING ENVIRONMENT: Lives with: lives with their family Lives in: House/apartment Stairs: No Has following equipment at home: None  PLOF: Independent  PATIENT GOALS: improve balance, mobility  and strength   OBJECTIVE:   DIAGNOSTIC FINDINGS:  MRI Brain impression. Age appropriate volume loss without lobar predominance.   COGNITION: Overall cognitive status: History of cognitive impairments - at baseline- memory therapy with [facility] currently   SENSATION: WFL  COORDINATION: Pin with heel to shin usign R LE, no s/s of incoordination  EDEMA:  None   MPOSTURE: No Significant postural limitations  LOWER EXTREMITY ROM:   R hip limited external rotaion although no measured, other joints WNL in LE   Active  Right Eval Left Eval  Hip flexion    Hip extension    Hip abduction    Hip adduction    Hip internal rotation    Hip external rotation    Knee flexion    Knee extension    Ankle dorsiflexion    Ankle plantarflexion    Ankle inversion    Ankle eversion     (Blank rows = not tested)  LOWER EXTREMITY MMT:    MMT Right Eval Left Eval  Hip flexion 3+* 4  Hip extension    Hip abduction 3+ 4  Hip adduction 3+ 4  Hip internal rotation    Hip external rotation    Knee flexion 4- 4  Knee extension 3+* 4  Ankle dorsiflexion 3+ 4  Ankle plantarflexion 4+ 4+  Ankle inversion    Ankle eversion    (Blank rows = not tested)  BED MOBILITY:  Pt has pain in her right hip with pain mobility.   TRANSFERS: Assistive device utilized: None  Sit to stand: Modified independence Stand to sit: Complete Independence Chair to chair: Modified independence Floor:  not tested   GAIT: Gait pattern: decreased step length- Left, decreased stance time- Right, scissoring, and antalgic Distance walked: 180 feet  Assistive device utilized: None Level of assistance: Modified independence Comments: intermittent scissoring gait, fatigued quickly with limitation by R hip pain.   FUNCTIONAL TESTS:  5 times sit to stand: 27.98 sec  6 minute walk test: unable to copmlete secondary to hip pain; 2 minute walk test 190 feet  10 meter walk test: .  Fontana Scale: 41  PATIENT  SURVEYS:  FOTO 43 risk adjusted goal of 40   TODAY'S TREATMENT:                                                                                                                               TherEx:  Seated hamstring stretch 45 sec each LE  x 2 rounds  Standing calf raises with 2.5# AW, verbal cuing for proper form, 2x15 each. Rates medium. Performs reps slowly.  Standing hip abd with 2.5# 1x15, 1x8 each LE. Fatiguing   Standing hip extension with 2.5# AW donned, 2x10 each LE. Rest break taken between sets.  Sit to stand 1x10, 1x12 with VC for correct technique without UE  E  Forward step up/over 1/2 foam then backward step with 2.5 AW x 12 reps  Then performed lateral stepping 12x each direction  Donkey kick (stand hip ext/knee flexed with 2.5 # AW) x 10 reps each LE.   PATIENT EDUCATION: Education details: POC Person educated: Patient Education method: Explanation Education comprehension: verbalized understanding  HOME EXERCISE PROGRAM: Access Code: [email or web address] URL: [email or web address] Date: 04/23/2022 Prepared by: Sande Brothers  Exercises - Hip Flexor Stretch on Step  - 1 x daily - 7 x weekly - 4 sets - 20-30 sec hold - Standing Hamstring Stretch with Step  - 1 x daily - 7 x weekly - 4 sets - 10 reps - 20-30 hold - Sit to Stand with Arms Crossed  - 1 x daily - 3 x weekly - 3 sets - 10 reps - Standing Heel Raises  - 1 x daily - 3 x weekly - 3 sets - 10 reps - Standing Hip Abduction with Counter Support  - 1 x daily - 7 x weekly - 3 sets - 10 reps  GOALS: Goals reviewed with patient? Yes  SHORT TERM GOALS: Target date: 05/06/2022  Patient will be independent in home exercise program to improve strength/mobility for better functional independence with ADLs. Baseline: No HEP currently  Goal status: INITIAL   LONG TERM GOALS: Target date: 07/01/2022  1.  Patient (> 86 years old) will complete five times sit to stand test in < 15 seconds indicating an  increased LE strength and improved balance. Baseline: 27.98sec Goal status: INITIAL  2.  Patient will increase FOTO score to equal to or greater than  52   to demonstrate statistically significant improvement in mobility and quality of life.  Baseline: 43 Goal status: INITIAL   3.  Patient will increase Berg Balance score by > 6 points to demonstrate decreased fall risk during functional activities. Baseline: 41 Goal status: INITIAL  4.   Patient will increase 10 meter walk test to >.17ms as to improve gait speed for better community ambulation and to reduce fall risk. Baseline: .603 m/s Goal status: INITIAL  5.   Patient will increase 2 minute walk test distance to >260 feet for progression to community  ambulator and improve gait ability Baseline: 190 ft Goal status: INITIAL   ASSESSMENT:  CLINICAL IMPRESSION:  Author continued plan of care as laid out in eval and recent prior sessions. Pt with excellent motivation and tolerated interventions generally well. Pt did report brief L knee pain with one standing exercise that resolved with rest. Pt did not experience this for remainder of session and had no pain with other interventions. Pt was slightly more fatigued today and performed fewer reps.  Pt will continue to benefit from skilled therapy to address remaining deficits in order to improve overall QoL and return to PLOF.    OBJECTIVE IMPAIRMENTS: Abnormal gait, decreased activity tolerance, decreased balance, decreased endurance, decreased mobility, difficulty walking, decreased strength, and hypomobility.   ACTIVITY LIMITATIONS: standing, squatting, bed mobility, and locomotion level  PARTICIPATION LIMITATIONS: cleaning, laundry, shopping, community activity, and yard work  PERSONAL FACTORS: 3+ comorbidities: HTN, MI, Bilateral LE pain  are also affecting patient's functional outcome.   REHAB POTENTIAL: Good  CLINICAL DECISION MAKING: Stable/uncomplicated  EVALUATION  COMPLEXITY: Low  PLAN:  PT FREQUENCY: 1-2x/week  PT DURATION: 8 weeks  PLANNED INTERVENTIONS: Therapeutic exercises, Therapeutic activity, Neuromuscular re-education, Balance training, Gait training, Patient/Family education, Self Care, Joint mobilization, and Stair training  PLAN FOR NEXT SESSION: Begin POC, provide and educate regarding HEP and importance of completion   Ricard Dillon PT, DPT  Physical Therapist- [facility]  05/08/22, 5:18 PM

## 2022-05-12 ENCOUNTER — Ambulatory Visit: Payer: Medicare Other | Admitting: Speech Pathology

## 2022-05-12 NOTE — Therapy (Incomplete)
OUTPATIENT PHYSICAL THERAPY NEURO TREATMENT   Patient Name: Tonya Keith MRN: [id number] DOB:22-Oct-1960, 61 y.o., female Today's Date: 05/12/2022   PCP: Sharyne Peach, MD  REFERRING PROVIDER: Vladimir Crofts, MD         Past Medical History:  Diagnosis Date   Anemia    Anginal pain ([place])    Breast cancer [facility]) 2008   chemo mastectomy left   Cancer [facility])    breast   COPD (chronic obstructive pulmonary disease) ([place])    Coronary artery disease    History of left heart catheterization    w/ pci to RCA   Hx of migraine headaches    Hypertension    Myocardial infarction [facility]) 2010   Personal history of chemotherapy    Wears dentures    full upper (currently unable to wear)   Past Surgical History:  Procedure Laterality Date   ABDOMINAL HYSTERECTOMY     LEFT HEART CATH AND CORONARY ANGIOGRAPHY Left 09/07/2019   Procedure: LEFT HEART CATH AND CORONARY ANGIOGRAPHY;  Surgeon: Yolonda Kida, MD;  Location: [place] [facility];  Service: Cardiovascular;  Laterality: Left;   LESION EXCISION WITH COMPLEX REPAIR Right 12/04/2020   Procedure: Excision right upper lip intraoral lesion;  Surgeon: Clyde Canterbury, MD;  Location: [place];  Service: ENT;  Laterality: Right;  Latex   MASTECTOMY Left 2008   MINOR EXCISION OF ORAL LESION N/A 10/02/2020   Procedure: excision of right upper lip mass, intraoral;  Surgeon: Clyde Canterbury, MD;  Location: [place];  Service: ENT;  Laterality: N/A;  Latex   Patient Active Problem List   Diagnosis Date Noted   Hypothermia 12/03/2017   Bilateral lower extremity pain 11/17/2017   Bilateral lower extremity edema 11/17/2017   Tobacco abuse 11/17/2017   Essential hypertension 11/17/2017   Hyperlipidemia 11/17/2017    ONSET DATE: 10/24/20  REFERRING DIAG: R26.89 (ICD-10-CM) - Imbalance   THERAPY DIAG:  No diagnosis found.  Rationale for Evaluation and Treatment: Rehabilitation  SUBJECTIVE:                                                                                                                                                                                              SUBJECTIVE STATEMENT:  Pt reports no pain, no stumbles/falls or LOB. She has been doing her HEP. She reports no other concerns.  Pt accompanied by: self  PERTINENT HISTORY:  Pt reports imbalance that has started in the middle part of last year. Pt reports she has had 1 fall within the last 12 months where she fell and hit her head on  the wall. She used to do a lot of walking but now she is afraid she will lose her balance and fall in the road if she attempts to go for a walk. Pt want to ensure she has the strength to take care of herself as she gets older. Pt wants to improve her balance with therapy and also wants to improve her strength so she can get stronger.   PAIN:  Are you having pain? Yes: NPRS scale: 6/10 Pain location: legs R > L with hip being primary pain site, pt does report generalized pain in her LE's bilaterally  Pain description: Achy, burning Aggravating factors: sleeping, walking prolonged distances, standing too long Relieving factors: rest, movement, elevation   PRECAUTIONS: Fall  WEIGHT BEARING RESTRICTIONS: No  FALLS: Has patient fallen in last 6 months? Yes. Number of falls 1  LIVING ENVIRONMENT: Lives with: lives with their family Lives in: House/apartment Stairs: No Has following equipment at home: None  PLOF: Independent  PATIENT GOALS: improve balance, mobility and strength   OBJECTIVE:   DIAGNOSTIC FINDINGS:  MRI Brain impression. Age appropriate volume loss without lobar predominance.   COGNITION: Overall cognitive status: History of cognitive impairments - at baseline- memory therapy with [facility] currently   SENSATION: WFL  COORDINATION: Pin with heel to shin usign R LE, no s/s of incoordination  EDEMA:  None   MPOSTURE: No Significant postural limitations  LOWER EXTREMITY  ROM:   R hip limited external rotaion although no measured, other joints WNL in LE   Active  Right Eval Left Eval  Hip flexion    Hip extension    Hip abduction    Hip adduction    Hip internal rotation    Hip external rotation    Knee flexion    Knee extension    Ankle dorsiflexion    Ankle plantarflexion    Ankle inversion    Ankle eversion     (Blank rows = not tested)  LOWER EXTREMITY MMT:    MMT Right Eval Left Eval  Hip flexion 3+* 4  Hip extension    Hip abduction 3+ 4  Hip adduction 3+ 4  Hip internal rotation    Hip external rotation    Knee flexion 4- 4  Knee extension 3+* 4  Ankle dorsiflexion 3+ 4  Ankle plantarflexion 4+ 4+  Ankle inversion    Ankle eversion    (Blank rows = not tested)  BED MOBILITY:  Pt has pain in her right hip with pain mobility.   TRANSFERS: Assistive device utilized: None  Sit to stand: Modified independence Stand to sit: Complete Independence Chair to chair: Modified independence Floor:  not tested   GAIT: Gait pattern: decreased step length- Left, decreased stance time- Right, scissoring, and antalgic Distance walked: 180 feet  Assistive device utilized: None Level of assistance: Modified independence Comments: intermittent scissoring gait, fatigued quickly with limitation by R hip pain.   FUNCTIONAL TESTS:  5 times sit to stand: 27.98 sec  6 minute walk test: unable to copmlete secondary to hip pain; 2 minute walk test 190 feet  10 meter walk test: .603 Berg Balance Scale: 41  PATIENT SURVEYS:  FOTO 43 risk adjusted goal of 52   TODAY'S TREATMENT:  TherEx:  Seated hamstring stretch 45 sec each LE  x 2 rounds  Standing calf raises with 2.5# AW, verbal cuing for proper form, 2x15 each. Rates medium. Performs reps slowly.  Standing hip abd with 2.5# 1x15, 1x8 each LE. Fatiguing    Standing hip extension with 2.5# AW donned, 2x10 each LE. Rest break taken between sets.  Sit to stand 1x10, 1x12 with VC for correct technique without UE  E  Forward step up/over 1/2 foam then backward step with 2.5 AW x 12 reps  Then performed lateral stepping 12x each direction  Donkey kick (stand hip ext/knee flexed with 2.5 # AW) x 10 reps each LE.   PATIENT EDUCATION: Education details: POC Person educated: Patient Education method: Explanation Education comprehension: verbalized understanding  HOME EXERCISE PROGRAM: Access Code: [email or web address] URL: [email or web address] Date: 04/23/2022 Prepared by: Sande Brothers  Exercises - Hip Flexor Stretch on Step  - 1 x daily - 7 x weekly - 4 sets - 20-30 sec hold - Standing Hamstring Stretch with Step  - 1 x daily - 7 x weekly - 4 sets - 10 reps - 20-30 hold - Sit to Stand with Arms Crossed  - 1 x daily - 3 x weekly - 3 sets - 10 reps - Standing Heel Raises  - 1 x daily - 3 x weekly - 3 sets - 10 reps - Standing Hip Abduction with Counter Support  - 1 x daily - 7 x weekly - 3 sets - 10 reps  GOALS: Goals reviewed with patient? Yes  SHORT TERM GOALS: Target date: 05/06/2022  Patient will be independent in home exercise program to improve strength/mobility for better functional independence with ADLs. Baseline: No HEP currently  Goal status: INITIAL   LONG TERM GOALS: Target date: 07/01/2022  1.  Patient (> 69 years old) will complete five times sit to stand test in < 15 seconds indicating an increased LE strength and improved balance. Baseline: 27.98sec Goal status: INITIAL  2.  Patient will increase FOTO score to equal to or greater than  52   to demonstrate statistically significant improvement in mobility and quality of life.  Baseline: 43 Goal status: INITIAL   3.  Patient will increase Berg Balance score by > 6 points to demonstrate decreased fall risk during functional activities. Baseline: 41 Goal  status: INITIAL  4.   Patient will increase 10 meter walk test to >.45ms as to improve gait speed for better community ambulation and to reduce fall risk. Baseline: .603 m/s Goal status: INITIAL  5.   Patient will increase 2 minute walk test distance to >260 feet for progression to community ambulator and improve gait ability Baseline: 190 ft Goal status: INITIAL   ASSESSMENT:  CLINICAL IMPRESSION:  *** Pt will continue to benefit from skilled therapy to address remaining deficits in order to improve overall QoL and return to PLOF.    OBJECTIVE IMPAIRMENTS: Abnormal gait, decreased activity tolerance, decreased balance, decreased endurance, decreased mobility, difficulty walking, decreased strength, and hypomobility.   ACTIVITY LIMITATIONS: standing, squatting, bed mobility, and locomotion level  PARTICIPATION LIMITATIONS: cleaning, laundry, shopping, community activity, and yard work  PERSONAL FACTORS: 3+ comorbidities: HTN, MI, Bilateral LE pain  are also affecting patient's functional outcome.   REHAB POTENTIAL: Good  CLINICAL DECISION MAKING: Stable/uncomplicated  EVALUATION COMPLEXITY: Low  PLAN:  PT FREQUENCY: 1-2x/week  PT DURATION: 8 weeks  PLANNED INTERVENTIONS: Therapeutic exercises, Therapeutic activity, Neuromuscular re-education, Balance training, Gait training, Patient/Family education, Self Care, Joint mobilization,  and Stair training  PLAN FOR NEXT SESSION: Begin POC, provide and educate regarding HEP and importance of completion   Janna Arch PT   Physical Therapist- [facility]  05/12/22, 10:59 AM

## 2022-05-13 ENCOUNTER — Ambulatory Visit: Payer: Medicare Other

## 2022-05-13 ENCOUNTER — Telehealth: Payer: Self-pay | Admitting: Speech Pathology

## 2022-05-13 ENCOUNTER — Ambulatory Visit: Payer: Medicare Other | Admitting: Speech Pathology

## 2022-05-13 NOTE — Telephone Encounter (Signed)
Pt didn't show up for her therapy sessions this morning at 8am.   I spoke with pt via phone and she advises that she is sick. She wishes to cancel her therapies on Thursday as well. Receptionist made aware of request.   Stillman Buenger B. Rutherford Nail, M.S., CCC-SLP, Mining engineer Certified Brain Injury [place]  [place] Office [phone] Ascom [phone] Fax [phone]

## 2022-05-13 NOTE — Telephone Encounter (Signed)
Late entry  This writer was made aware pt had called requesting SLP to return her all. SLP called number listed and lvm.   Yaiden Yang B. Rutherford Nail, M.S., CCC-SLP, Mining engineer Certified Brain Injury [place]  [place] Office [phone] Ascom [phone] Fax [phone]

## 2022-05-14 ENCOUNTER — Ambulatory Visit: Payer: Medicare Other | Admitting: Speech Pathology

## 2022-05-14 ENCOUNTER — Ambulatory Visit: Payer: Medicare Other | Admitting: Physical Therapy

## 2022-05-15 ENCOUNTER — Ambulatory Visit: Payer: Medicare Other | Admitting: Speech Pathology

## 2022-05-15 ENCOUNTER — Ambulatory Visit: Payer: Medicare Other | Admitting: Physical Therapy

## 2022-05-20 ENCOUNTER — Telehealth: Payer: Self-pay | Admitting: Physical Therapy

## 2022-05-20 ENCOUNTER — Ambulatory Visit: Payer: Medicare Other | Admitting: Physical Therapy

## 2022-05-20 NOTE — Telephone Encounter (Signed)
PT contacted patient and pt reports she was unable to attend today because she soul not get a ride ( transportation did not have enough drivers). Pt states she will be at next scheduled appointment on Thursday 05/22/22  Rivka Barbara PT, DPT

## 2022-05-20 NOTE — Therapy (Deleted)
OUTPATIENT PHYSICAL THERAPY NEURO TREATMENT   Patient Name: Tonya Keith MRN: [id number] DOB:26-Jan-1961, 61 y.o., female Today's Date: 05/20/2022   PCP: Sharyne Peach, MD  REFERRING PROVIDER: Vladimir Crofts, MD         Past Medical History:  Diagnosis Date   Anemia    Anginal pain ([place])    Breast cancer [facility]) 2008   chemo mastectomy left   Cancer [facility])    breast   COPD (chronic obstructive pulmonary disease) ([place])    Coronary artery disease    History of left heart catheterization    w/ pci to RCA   Hx of migraine headaches    Hypertension    Myocardial infarction [facility]) 2010   Personal history of chemotherapy    Wears dentures    full upper (currently unable to wear)   Past Surgical History:  Procedure Laterality Date   ABDOMINAL HYSTERECTOMY     LEFT HEART CATH AND CORONARY ANGIOGRAPHY Left 09/07/2019   Procedure: LEFT HEART CATH AND CORONARY ANGIOGRAPHY;  Surgeon: Yolonda Kida, MD;  Location: [place] [facility];  Service: Cardiovascular;  Laterality: Left;   LESION EXCISION WITH COMPLEX REPAIR Right 12/04/2020   Procedure: Excision right upper lip intraoral lesion;  Surgeon: Clyde Canterbury, MD;  Location: [place];  Service: ENT;  Laterality: Right;  Latex   MASTECTOMY Left 2008   MINOR EXCISION OF ORAL LESION N/A 10/02/2020   Procedure: excision of right upper lip mass, intraoral;  Surgeon: Clyde Canterbury, MD;  Location: [place];  Service: ENT;  Laterality: N/A;  Latex   Patient Active Problem List   Diagnosis Date Noted   Hypothermia 12/03/2017   Bilateral lower extremity pain 11/17/2017   Bilateral lower extremity edema 11/17/2017   Tobacco abuse 11/17/2017   Essential hypertension 11/17/2017   Hyperlipidemia 11/17/2017    ONSET DATE: 10/24/20  REFERRING DIAG: R26.89 (ICD-10-CM) - Imbalance   THERAPY DIAG:  No diagnosis found.  Rationale for Evaluation and Treatment: Rehabilitation  SUBJECTIVE:                                                                                                                                                                                              SUBJECTIVE STATEMENT:  Pt reports no pain, no stumbles/falls or LOB. She has been doing her HEP. She reports no other concerns.  Pt accompanied by: self  PERTINENT HISTORY:  Pt reports imbalance that has started in the middle part of last year. Pt reports she has had 1 fall within the last 12 months where she fell and hit her head on  the wall. She used to do a lot of walking but now she is afraid she will lose her balance and fall in the road if she attempts to go for a walk. Pt want to ensure she has the strength to take care of herself as she gets older. Pt wants to improve her balance with therapy and also wants to improve her strength so she can get stronger.   PAIN:  Are you having pain? Yes: NPRS scale: 6/10 Pain location: legs R > L with hip being primary pain site, pt does report generalized pain in her LE's bilaterally  Pain description: Achy, burning Aggravating factors: sleeping, walking prolonged distances, standing too long Relieving factors: rest, movement, elevation   PRECAUTIONS: Fall  WEIGHT BEARING RESTRICTIONS: No  FALLS: Has patient fallen in last 6 months? Yes. Number of falls 1  LIVING ENVIRONMENT: Lives with: lives with their family Lives in: House/apartment Stairs: No Has following equipment at home: None  PLOF: Independent  PATIENT GOALS: improve balance, mobility and strength   OBJECTIVE:   DIAGNOSTIC FINDINGS:  MRI Brain impression. Age appropriate volume loss without lobar predominance.   COGNITION: Overall cognitive status: History of cognitive impairments - at baseline- memory therapy with [facility] currently   SENSATION: WFL  COORDINATION: Pin with heel to shin usign R LE, no s/s of incoordination  EDEMA:  None   MPOSTURE: No Significant postural limitations  LOWER EXTREMITY  ROM:   R hip limited external rotaion although no measured, other joints WNL in LE   Active  Right Eval Left Eval  Hip flexion    Hip extension    Hip abduction    Hip adduction    Hip internal rotation    Hip external rotation    Knee flexion    Knee extension    Ankle dorsiflexion    Ankle plantarflexion    Ankle inversion    Ankle eversion     (Blank rows = not tested)  LOWER EXTREMITY MMT:    MMT Right Eval Left Eval  Hip flexion 3+* 4  Hip extension    Hip abduction 3+ 4  Hip adduction 3+ 4  Hip internal rotation    Hip external rotation    Knee flexion 4- 4  Knee extension 3+* 4  Ankle dorsiflexion 3+ 4  Ankle plantarflexion 4+ 4+  Ankle inversion    Ankle eversion    (Blank rows = not tested)  BED MOBILITY:  Pt has pain in her right hip with pain mobility.   TRANSFERS: Assistive device utilized: None  Sit to stand: Modified independence Stand to sit: Complete Independence Chair to chair: Modified independence Floor:  not tested   GAIT: Gait pattern: decreased step length- Left, decreased stance time- Right, scissoring, and antalgic Distance walked: 180 feet  Assistive device utilized: None Level of assistance: Modified independence Comments: intermittent scissoring gait, fatigued quickly with limitation by R hip pain.   FUNCTIONAL TESTS:  5 times sit to stand: 27.98 sec  6 minute walk test: unable to copmlete secondary to hip pain; 2 minute walk test 190 feet  10 meter walk test: .603 Berg Balance Scale: 41  PATIENT SURVEYS:  FOTO 43 risk adjusted goal of 52   TODAY'S TREATMENT:  TherEx:  Seated hamstring stretch 45 sec each LE  x 2 rounds  Standing calf raises with 2.5# AW, verbal cuing for proper form, 2x15 each. Rates medium. Performs reps slowly.  Standing hip abd with 2.5# 1x15, 1x8 each LE. Fatiguing    Standing hip extension with 2.5# AW donned, 2x10 each LE. Rest break taken between sets.  Sit to stand 1x10, 1x12 with VC for correct technique without UE  E  Forward step up/over 1/2 foam then backward step with 2.5 AW x 12 reps  Then performed lateral stepping 12x each direction  Donkey kick (stand hip ext/knee flexed with 2.5 # AW) x 10 reps each LE.   PATIENT EDUCATION: Education details: POC Person educated: Patient Education method: Explanation Education comprehension: verbalized understanding  HOME EXERCISE PROGRAM: Access Code: [email or web address] URL: [email or web address] Date: 04/23/2022 Prepared by: Sande Brothers  Exercises - Hip Flexor Stretch on Step  - 1 x daily - 7 x weekly - 4 sets - 20-30 sec hold - Standing Hamstring Stretch with Step  - 1 x daily - 7 x weekly - 4 sets - 10 reps - 20-30 hold - Sit to Stand with Arms Crossed  - 1 x daily - 3 x weekly - 3 sets - 10 reps - Standing Heel Raises  - 1 x daily - 3 x weekly - 3 sets - 10 reps - Standing Hip Abduction with Counter Support  - 1 x daily - 7 x weekly - 3 sets - 10 reps  GOALS: Goals reviewed with patient? Yes  SHORT TERM GOALS: Target date: 05/06/2022  Patient will be independent in home exercise program to improve strength/mobility for better functional independence with ADLs. Baseline: No HEP currently  Goal status: INITIAL   LONG TERM GOALS: Target date: 07/01/2022  1.  Patient (> 80 years old) will complete five times sit to stand test in < 15 seconds indicating an increased LE strength and improved balance. Baseline: 27.98sec Goal status: INITIAL  2.  Patient will increase FOTO score to equal to or greater than  52   to demonstrate statistically significant improvement in mobility and quality of life.  Baseline: 43 Goal status: INITIAL   3.  Patient will increase Berg Balance score by > 6 points to demonstrate decreased fall risk during functional activities. Baseline: 41 Goal  status: INITIAL  4.   Patient will increase 10 meter walk test to >.66ms as to improve gait speed for better community ambulation and to reduce fall risk. Baseline: .603 m/s Goal status: INITIAL  5.   Patient will increase 2 minute walk test distance to >260 feet for progression to community ambulator and improve gait ability Baseline: 190 ft Goal status: INITIAL   ASSESSMENT:  CLINICAL IMPRESSION:  Author continued plan of care as laid out in eval and recent prior sessions. Pt with excellent motivation and tolerated interventions generally well. Pt did report brief L knee pain with one standing exercise that resolved with rest. Pt did not experience this for remainder of session and had no pain with other interventions. Pt was slightly more fatigued today and performed fewer reps.  Pt will continue to benefit from skilled therapy to address remaining deficits in order to improve overall QoL and return to PLOF.    OBJECTIVE IMPAIRMENTS: Abnormal gait, decreased activity tolerance, decreased balance, decreased endurance, decreased mobility, difficulty walking, decreased strength, and hypomobility.   ACTIVITY LIMITATIONS: standing, squatting, bed mobility, and locomotion level  PARTICIPATION LIMITATIONS: cleaning, laundry, shopping, community activity,  and yard work  PERSONAL FACTORS: 3+ comorbidities: HTN, MI, Bilateral LE pain  are also affecting patient's functional outcome.   REHAB POTENTIAL: Good  CLINICAL DECISION MAKING: Stable/uncomplicated  EVALUATION COMPLEXITY: Low  PLAN:  PT FREQUENCY: 1-2x/week  PT DURATION: 8 weeks  PLANNED INTERVENTIONS: Therapeutic exercises, Therapeutic activity, Neuromuscular re-education, Balance training, Gait training, Patient/Family education, Self Care, Joint mobilization, and Stair training  PLAN FOR NEXT SESSION: Begin POC, provide and educate regarding HEP and importance of completion   Ricard Dillon PT, DPT  Physical Therapist- [facility]  05/20/22, 7:59 AM

## 2022-05-22 ENCOUNTER — Ambulatory Visit: Payer: Medicare Other

## 2022-05-29 ENCOUNTER — Ambulatory Visit: Payer: 59 | Attending: Neurology | Admitting: Physical Therapy

## 2022-05-29 ENCOUNTER — Ambulatory Visit: Payer: 59 | Admitting: Speech Pathology

## 2022-05-29 DIAGNOSIS — R41841 Cognitive communication deficit: Secondary | ICD-10-CM

## 2022-05-29 DIAGNOSIS — R2689 Other abnormalities of gait and mobility: Secondary | ICD-10-CM | POA: Insufficient documentation

## 2022-05-29 DIAGNOSIS — R4189 Other symptoms and signs involving cognitive functions and awareness: Secondary | ICD-10-CM | POA: Insufficient documentation

## 2022-05-29 DIAGNOSIS — R2681 Unsteadiness on feet: Secondary | ICD-10-CM | POA: Diagnosis not present

## 2022-05-29 DIAGNOSIS — R413 Other amnesia: Secondary | ICD-10-CM

## 2022-05-29 DIAGNOSIS — M6281 Muscle weakness (generalized): Secondary | ICD-10-CM | POA: Diagnosis present

## 2022-05-29 NOTE — Therapy (Signed)
OUTPATIENT PHYSICAL THERAPY NEURO TREATMENT   Patient Name: Tonya Keith MRN: [id number] DOB:Dec 24, 1960, 62 y.o., female Today's Date: 05/29/2022   PCP: Sharyne Peach, MD  REFERRING PROVIDER: Vladimir Crofts, MD    PT End of Session - 05/29/22 0753     Visit Number 6    Number of Visits 16    Date for PT Re-Evaluation 06/03/22    Progress Note Due on Visit 10    PT Start Time 0801    PT Stop Time 0844    PT Time Calculation (min) 43 min    Equipment Utilized During Treatment Gait belt    Activity Tolerance Patient tolerated treatment well;No increased pain    Behavior During Therapy WFL for tasks assessed/performed                 Past Medical History:  Diagnosis Date   Anemia    Anginal pain ([place])    Breast cancer ([place]) 2008   chemo mastectomy left   Cancer ([facility])    breast   COPD (chronic obstructive pulmonary disease) ([place])    Coronary artery disease    History of left heart catheterization    w/ pci to RCA   Hx of migraine headaches    Hypertension    Myocardial infarction [facility] 2010   Personal history of chemotherapy    Wears dentures    full upper (currently unable to wear)   Past Surgical History:  Procedure Laterality Date   ABDOMINAL HYSTERECTOMY     LEFT HEART CATH AND CORONARY ANGIOGRAPHY Left 09/07/2019   Procedure: LEFT HEART CATH AND CORONARY ANGIOGRAPHY;  Surgeon: Yolonda Kida, MD;  Location: [place] [facility];  Service: Cardiovascular;  Laterality: Left;   LESION EXCISION WITH COMPLEX REPAIR Right 12/04/2020   Procedure: Excision right upper lip intraoral lesion;  Surgeon: Clyde Canterbury, MD;  Location: [place];  Service: ENT;  Laterality: Right;  Latex   MASTECTOMY Left 2008   MINOR EXCISION OF ORAL LESION N/A 10/02/2020   Procedure: excision of right upper lip mass, intraoral;  Surgeon: Clyde Canterbury, MD;  Location: [place];  Service: ENT;  Laterality: N/A;  Latex   Patient Active Problem List   Diagnosis  Date Noted   Hypothermia 12/03/2017   Bilateral lower extremity pain 11/17/2017   Bilateral lower extremity edema 11/17/2017   Tobacco abuse 11/17/2017   Essential hypertension 11/17/2017   Hyperlipidemia 11/17/2017    ONSET DATE: 10/24/20  REFERRING DIAG: R26.89 (ICD-10-CM) - Imbalance   THERAPY DIAG:  Unsteadiness on feet  Other abnormalities of gait and mobility  Muscle weakness (generalized)  Rationale for Evaluation and Treatment: Rehabilitation  SUBJECTIVE:  SUBJECTIVE STATEMENT:  Pt reports no pain, no stumbles/falls or LOB. She has been doing her HEP. She reports no other concerns.  Pt accompanied by: self  PERTINENT HISTORY:  Pt reports imbalance that has started in the middle part of last year. Pt reports she has had 1 fall within the last 12 months where she fell and hit her head on the wall. She used to do a lot of walking but now she is afraid she will lose her balance and fall in the road if she attempts to go for a walk. Pt want to ensure she has the strength to take care of herself as she gets older. Pt wants to improve her balance with therapy and also wants to improve her strength so she can get stronger.   PAIN:  Are you having pain? Yes: NPRS scale: 6/10 Pain location: legs R > L with hip being primary pain site, pt does report generalized pain in her LE's bilaterally  Pain description: Achy, burning Aggravating factors: sleeping, walking prolonged distances, standing too long Relieving factors: rest, movement, elevation   PRECAUTIONS: Fall  WEIGHT BEARING RESTRICTIONS: No  FALLS: Has patient fallen in last 6 months? Yes. Number of falls 1  LIVING ENVIRONMENT: Lives with: lives with their family Lives in: House/apartment Stairs: No Has following equipment at home:  None  PLOF: Independent  PATIENT GOALS: improve balance, mobility and strength   OBJECTIVE:   DIAGNOSTIC FINDINGS:  MRI Brain impression. Age appropriate volume loss without lobar predominance.   COGNITION: Overall cognitive status: History of cognitive impairments - at baseline- memory therapy with [facility] currently   SENSATION: WFL  COORDINATION: Pin with heel to shin usign R LE, no s/s of incoordination  EDEMA:  None   MPOSTURE: No Significant postural limitations  LOWER EXTREMITY ROM:   R hip limited external rotaion although no measured, other joints WNL in LE   Active  Right Eval Left Eval  Hip flexion    Hip extension    Hip abduction    Hip adduction    Hip internal rotation    Hip external rotation    Knee flexion    Knee extension    Ankle dorsiflexion    Ankle plantarflexion    Ankle inversion    Ankle eversion     (Blank rows = not tested)  LOWER EXTREMITY MMT:    MMT Right Eval Left Eval  Hip flexion 3+* 4  Hip extension    Hip abduction 3+ 4  Hip adduction 3+ 4  Hip internal rotation    Hip external rotation    Knee flexion 4- 4  Knee extension 3+* 4  Ankle dorsiflexion 3+ 4  Ankle plantarflexion 4+ 4+  Ankle inversion    Ankle eversion    (Blank rows = not tested)  BED MOBILITY:  Pt has pain in her right hip with pain mobility.   TRANSFERS: Assistive device utilized: None  Sit to stand: Modified independence Stand to sit: Complete Independence Chair to chair: Modified independence Floor:  not tested   GAIT: Gait pattern: decreased step length- Left, decreased stance time- Right, scissoring, and antalgic Distance walked: 180 feet  Assistive device utilized: None Level of assistance: Modified independence Comments: intermittent scissoring gait, fatigued quickly with limitation by R hip pain.   FUNCTIONAL TESTS:  5 times sit to stand: 27.98 sec  6 minute walk test: unable to copmlete secondary to hip pain; 2 minute walk test  190 feet  10 meter walk  test: .603 Berg Balance Scale: 41  PATIENT SURVEYS:  FOTO 43 risk adjusted goal of 52   TODAY'S TREATMENT:   05/29/22  Treatment provided this session   Pt educated throughout session about proper posture and technique with exercises. Improved exercise technique, movement at target joints, use of target muscles after min to mod verbal, visual, tactile cues. Note: Portions of this document were prepared using Dragon voice recognition software and although reviewed may contain unintentional dictation errors in syntax, grammar, or spelling.                                                                                                                        Exercise/Activity Sets/ Reps/Time/ Resistance Assistance Charge type Comments  NBOS on airex with head turns  3 x 45 sec  Neuro re-ed   Marching on airex  10 x each  Neuro re-ed Rating perceived stability 6 moderately unbalanced   Tandem stance Tandem walk / reto walk return   x 45 sec ea X 4 laps   Neuro re-ed   SLS progression  3 x 30 sec ea   Neuro re-ed   Standing calf raises  2 x 15 with 3# AW donned   TE Good technique without cues, fatigue noted at end of reps   STS  1 x 10  1x 12   TA Good technique   Standing hip abduciton  2 x 10 ea LE with 3# AW  TE Cues for hip orientation for activation of appropriate muscles   Standing donkey kicks  2 x 10 with 2# AW   TE          PATIENT EDUCATION: Education details: POC Person educated: Patient Education method: Explanation Education comprehension: verbalized understanding  HOME EXERCISE PROGRAM: Access Code: [email or web address] URL: [email or web address] Date: 04/23/2022 Prepared by: Sande Brothers  Exercises - Hip Flexor Stretch on Step  - 1 x daily - 7 x weekly - 4 sets - 20-30 sec hold - Standing Hamstring Stretch with Step  - 1 x daily - 7 x weekly - 4 sets - 10 reps - 20-30 hold - Sit to Stand with Arms Crossed  - 1 x daily - 3 x weekly  - 3 sets - 10 reps - Standing Heel Raises  - 1 x daily - 3 x weekly - 3 sets - 10 reps - Standing Hip Abduction with Counter Support  - 1 x daily - 7 x weekly - 3 sets - 10 reps  GOALS: Goals reviewed with patient? Yes  SHORT TERM GOALS: Target date: 05/06/2022  Patient will be independent in home exercise program to improve strength/mobility for better functional independence with ADLs. Baseline: No HEP currently  Goal status: INITIAL   LONG TERM GOALS: Target date: 07/01/2022  1.  Patient (> 34 years old) will complete five times sit to stand test in < 15 seconds indicating an increased LE strength and improved balance. Baseline: 27.98sec Goal status: INITIAL  2.  Patient  will increase FOTO score to equal to or greater than  52   to demonstrate statistically significant improvement in mobility and quality of life.  Baseline: 43 Goal status: INITIAL   3.  Patient will increase Berg Balance score by > 6 points to demonstrate decreased fall risk during functional activities. Baseline: 41 Goal status: INITIAL  4.   Patient will increase 10 meter walk test to >.76ms as to improve gait speed for better community ambulation and to reduce fall risk. Baseline: .603 m/s Goal status: INITIAL  5.   Patient will increase 2 minute walk test distance to >260 feet for progression to community ambulator and improve gait ability Baseline: 190 ft Goal status: INITIAL   ASSESSMENT:  CLINICAL IMPRESSION:  Continued with current plan of care as laid out in evaluation and recent prior sessions. Pt remains motivated to advance progress toward goals in order to maximize independence and safety at home. Pt requires high level assistance and cuing for completion of exercises in order to provide adequate level of stimulation and perturbation. Author allows pt as much opportunity as possible to perform independent righting strategies, only stepping in when pt is unable to prevent falling to floor. Pt  closely monitored throughout session for safe vitals response and to maximize patient safety during interventions. Pt continues to demonstrate progress toward goals AEB progression of some interventions this date either in volume or intensity.   OBJECTIVE IMPAIRMENTS: Abnormal gait, decreased activity tolerance, decreased balance, decreased endurance, decreased mobility, difficulty walking, decreased strength, and hypomobility.   ACTIVITY LIMITATIONS: standing, squatting, bed mobility, and locomotion level  PARTICIPATION LIMITATIONS: cleaning, laundry, shopping, community activity, and yard work  PERSONAL FACTORS: 3+ comorbidities: HTN, MI, Bilateral LE pain  are also affecting patient's functional outcome.   REHAB POTENTIAL: Good  CLINICAL DECISION MAKING: Stable/uncomplicated  EVALUATION COMPLEXITY: Low  PLAN:  PT FREQUENCY: 1-2x/week  PT DURATION: 8 weeks  PLANNED INTERVENTIONS: Therapeutic exercises, Therapeutic activity, Neuromuscular re-education, Balance training, Gait training, Patient/Family education, Self Care, Joint mobilization, and Stair training  PLAN FOR NEXT SESSION: Continue POC  CParticia LatherPT  05/29/22, 10:03 AM

## 2022-05-29 NOTE — Therapy (Incomplete)
OUTPATIENT SPEECH LANGUAGE PATHOLOGY TREATMENT NOTE   Patient Name: Tonya Keith MRN: [id number] DOB:1961/02/03, 62 y.o., female Today's Date: 05/29/2022  PCP: Salome Holmes, MD REFERRING PROVIDER: Jennings Books, MD  END OF SESSION:   End of Session - 05/29/22 0910     Visit Number 5    Number of Visits 17    Date for SLP Re-Evaluation 06/03/22    Authorization Type [employer] Medicare    Authorization Time Period 04/08/2022 thru 06/03/2022    Progress Note Due on Visit 10    SLP Start Time 0900    SLP Stop Time  1000    SLP Time Calculation (min) 60 min    Activity Tolerance Patient tolerated treatment well             Past Medical History:  Diagnosis Date   Anemia    Anginal pain ([place])    Breast cancer ([place]) 2008   chemo mastectomy left   Cancer ([place])    breast   COPD (chronic obstructive pulmonary disease) ([place])    Coronary artery disease    History of left heart catheterization    w/ pci to RCA   Hx of migraine headaches    Hypertension    Myocardial infarction [facility]) 2010   Personal history of chemotherapy    Wears dentures    full upper (currently unable to wear)   Past Surgical History:  Procedure Laterality Date   ABDOMINAL HYSTERECTOMY     LEFT HEART CATH AND CORONARY ANGIOGRAPHY Left 09/07/2019   Procedure: LEFT HEART CATH AND CORONARY ANGIOGRAPHY;  Surgeon: Yolonda Kida, MD;  Location: [place] [facility];  Service: Cardiovascular;  Laterality: Left;   LESION EXCISION WITH COMPLEX REPAIR Right 12/04/2020   Procedure: Excision right upper lip intraoral lesion;  Surgeon: Clyde Canterbury, MD;  Location: [place];  Service: ENT;  Laterality: Right;  Latex   MASTECTOMY Left 2008   MINOR EXCISION OF ORAL LESION N/A 10/02/2020   Procedure: excision of right upper lip mass, intraoral;  Surgeon: Clyde Canterbury, MD;  Location: [place];  Service: ENT;  Laterality: N/A;  Latex   Patient Active Problem List   Diagnosis Date Noted   Hypothermia  12/03/2017   Bilateral lower extremity pain 11/17/2017   Bilateral lower extremity edema 11/17/2017   Tobacco abuse 11/17/2017   Essential hypertension 11/17/2017   Hyperlipidemia 11/17/2017    ONSET DATE: referral date 02/18/2022; memory loss dating back to 2022   REFERRING DIAG: R41.3 (ICD-10-CM) - Memory loss or impairment    PERTINENT HISTORY:  Memory loss -  Onset has been gradual since 2022 Headaches - in a patient with history of migraines since early age years with increased headache in past several years + occasional pulsatile tinnitus, valsalva induced headache + partially empty sella -concerning for component of intracranial hypertension     DIAGNOSTIC FINDINGS:    MRV Head Without Contrast 03/14/2022 -  1. Age appropriate volume loss without lobar predominance. See  neuroquant analysis for additional findings.  2. Partially empty sella and mild tonsillar ectopia measuring up to  7 mm. Correlate with symptoms of idiopathic intracranial  hypertension.  3. Left sphenoid sinusitis.  4. No evidence of dural venous sinus thromobosis.    Cerebellar Tonsillar Ectopia 53m    SLUMS -  02/13/2022 was 20/30 Difficulty with mental math problem solving; clock drawing and attention (digits backwards) THERAPY DIAG:  Cognitive communication deficit  Memory loss or impairment  Cognitive impairment  Rationale for Evaluation and Treatment Rehabilitation  SUBJECTIVE: pt pleasant, concerned about transportation  Pt accompanied by: self  PAIN:  Are you having pain? No  PATIENT GOALS: pt unable to state  OBJECTIVE:   TODAY'S TREATMENT:  Skilled treatment session focused on pt's cognitive impairment, specifically her memory loss. SLP facilitated the session by providing the following activities:  Pt presents with disorganized explanation of current transportation issue. However, pt was able to problem solve how to get needed information and with that information, SLP was  able to gain understanding of situation. SLP facilitated communicating information to front desk for scheduling purposes.   Pt was Mod I when completing seasonal semi-complex problem solving activity.   PATIENT EDUCATION: Education details: external memory aids Person educated: Patient Education method: Explanation Education comprehension: verbalized understanding and needs further education  HOME EXERCISE PROGRAM:   Complete seasonal word searches  GOALS: Goals reviewed with patient? Yes   SHORT TERM GOALS: Target date: 10 sessions   Given a functional problem (ie., social problem, multi-step math word problem, logic puzzle) or scenario from daily life, patient will demonstrate problem solving with > 80% accuracy and rare Min A.  Baseline: Goal status: INITIAL   Pt will use strategies to improve memory for important information with 75% acc. with minimal assistance (ie., white board, daily planner/calendar, Apps on phone).  Baseline: Goal status: INITIAL     LONG TERM GOALS: Target date: 06/03/2022 Given a functional problem a scenario from daily life, patient will demonstrate error awareness and correct errors independently with at least 90% acc.              Baseline:  Goal status: INITIAL   2.  Pt will use strategies to improve memory for important information with 90% acc. Independently(ie., white board, daily planner/calendar, Apps on phone).  Baseline:  Goal status: INITIAL  ASSESSMENT:  CLINICAL IMPRESSION:  Pt presents with improving recall of information and continues to be eager to employ internal and external memory aids.   OBJECTIVE IMPAIRMENTS include memory and executive functioning. These impairments are limiting patient from managing medications, managing appointments, managing finances, household responsibilities, and ADLs/IADLs. Factors affecting potential to achieve goals and functional outcome are medical prognosis, previous level of function, severity  of impairments, financial resources, and family/community support. Patient will benefit from skilled SLP services to address above impairments and improve overall function.  REHAB POTENTIAL: Good  PLAN: SLP FREQUENCY: 1-2x/week  SLP DURATION: 8 weeks  PLANNED INTERVENTIONS: Environmental controls, Cognitive reorganization, Internal/external aids, Functional tasks, SLP instruction and feedback, Compensatory strategies, and Patient/family education   Jaiyla Granados B. Rutherford Nail, M.S., CCC-SLP, Mining engineer Certified Brain Injury [place]  [place] Office [phone] Ascom [phone] Fax [phone]

## 2022-06-01 NOTE — Therapy (Signed)
OUTPATIENT SPEECH LANGUAGE PATHOLOGY TREATMENT NOTE   Patient Name: Tonya Keith MRN: [id number] DOB:01-15-1961, 62 y.o., female Today's Date: 05/29/2022  PCP: Salome Holmes, MD REFERRING PROVIDER: Jennings Books, MD  END OF SESSION:   End of Session - 06/01/22 1336     Visit Number 5    Number of Visits 17    Date for SLP Re-Evaluation 06/03/22    Authorization Type UHC Medicare    Authorization Time Period 04/08/2022 thru 06/03/2022    Progress Note Due on Visit 10    SLP Start Time 0900    SLP Stop Time  1000    SLP Time Calculation (min) 60 min    Activity Tolerance Patient tolerated treatment well             End of Session - 06/01/22 1336     Visit Number 5    Number of Visits 17    Date for SLP Re-Evaluation 06/03/22    Authorization Type UHC Medicare    Authorization Time Period 04/08/2022 thru 06/03/2022    Progress Note Due on Visit 10    SLP Start Time 0900    SLP Stop Time  1000    SLP Time Calculation (min) 60 min    Activity Tolerance Patient tolerated treatment well              Past Medical History:  Diagnosis Date   Anemia    Anginal pain ([place])    Breast cancer ([place]) 2008   chemo mastectomy left   Cancer ([facility])    breast   COPD (chronic obstructive pulmonary disease) ([place])    Coronary artery disease    History of left heart catheterization    w/ pci to RCA   Hx of migraine headaches    Hypertension    Myocardial infarction [facility]) 2010   Personal history of chemotherapy    Wears dentures    full upper (currently unable to wear)   Past Surgical History:  Procedure Laterality Date   ABDOMINAL HYSTERECTOMY     LEFT HEART CATH AND CORONARY ANGIOGRAPHY Left 09/07/2019   Procedure: LEFT HEART CATH AND CORONARY ANGIOGRAPHY;  Surgeon: Yolonda Kida, MD;  Location: [place] [facility];  Service: Cardiovascular;  Laterality: Left;   LESION EXCISION WITH COMPLEX REPAIR Right 12/04/2020   Procedure: Excision right upper lip intraoral  lesion;  Surgeon: Clyde Canterbury, MD;  Location: [place];  Service: ENT;  Laterality: Right;  Latex   MASTECTOMY Left 2008   MINOR EXCISION OF ORAL LESION N/A 10/02/2020   Procedure: excision of right upper lip mass, intraoral;  Surgeon: Clyde Canterbury, MD;  Location: [place];  Service: ENT;  Laterality: N/A;  Latex   Patient Active Problem List   Diagnosis Date Noted   Hypothermia 12/03/2017   Bilateral lower extremity pain 11/17/2017   Bilateral lower extremity edema 11/17/2017   Tobacco abuse 11/17/2017   Essential hypertension 11/17/2017   Hyperlipidemia 11/17/2017    ONSET DATE: referral date 02/18/2022; memory loss dating back to 2022   REFERRING DIAG: R41.3 (ICD-10-CM) - Memory loss or impairment    PERTINENT HISTORY:  Memory loss -  Onset has been gradual since 2022 Headaches - in a patient with history of migraines since early age years with increased headache in past several years + occasional pulsatile tinnitus, valsalva induced headache + partially empty sella -concerning for component of intracranial hypertension     DIAGNOSTIC FINDINGS:    MRV Head Without  Contrast 03/14/2022 -  1. Age appropriate volume loss without lobar predominance. See  neuroquant analysis for additional findings.  2. Partially empty sella and mild tonsillar ectopia measuring up to  7 mm. Correlate with symptoms of idiopathic intracranial  hypertension.  3. Left sphenoid sinusitis.  4. No evidence of dural venous sinus thromobosis.    Cerebellar Tonsillar Ectopia 5m    SLUMS -  02/13/2022 was 20/30 Difficulty with mental math problem solving; clock drawing and attention (digits backwards) THERAPY DIAG:  Cognitive communication deficit  Memory loss or impairment  Cognitive impairment  Rationale for Evaluation and Treatment Rehabilitation  SUBJECTIVE: pt pleasant, concerned about transportation  Pt accompanied by: self  PAIN:  Are you having pain?  No  PATIENT GOALS: pt unable to state  OBJECTIVE:   TODAY'S TREATMENT:  Skilled treatment session focused on pt's cognitive impairment, specifically her memory loss. SLP facilitated the session by providing the following activities:  Pt presents with disorganized explanation of current transportation issue. However, pt was able to problem solve how to get needed information and with that information, SLP was able to gain understanding of situation. SLP facilitated communicating information to front desk for scheduling purposes.   Pt was Mod I when completing seasonal semi-complex problem solving activity.   PATIENT EDUCATION: Education details: external memory aids Person educated: Patient Education method: Explanation Education comprehension: verbalized understanding and needs further education  HOME EXERCISE PROGRAM:   Complete seasonal word searches  GOALS: Goals reviewed with patient? Yes   SHORT TERM GOALS: Target date: 10 sessions   Given a functional problem (ie., social problem, multi-step math word problem, logic puzzle) or scenario from daily life, patient will demonstrate problem solving with > 80% accuracy and rare Min A.  Baseline: Goal status: INITIAL   Pt will use strategies to improve memory for important information with 75% acc. with minimal assistance (ie., white board, daily planner/calendar, Apps on phone).  Baseline: Goal status: INITIAL     LONG TERM GOALS: Target date: 06/03/2022 Given a functional problem a scenario from daily life, patient will demonstrate error awareness and correct errors independently with at least 90% acc.              Baseline:  Goal status: INITIAL   2.  Pt will use strategies to improve memory for important information with 90% acc. Independently(ie., white board, daily planner/calendar, Apps on phone).  Baseline:  Goal status: INITIAL  ASSESSMENT:  CLINICAL IMPRESSION:  Pt presents with improving recall of  information and continues to be eager to employ internal and external memory aids.   OBJECTIVE IMPAIRMENTS include memory and executive functioning. These impairments are limiting patient from managing medications, managing appointments, managing finances, household responsibilities, and ADLs/IADLs. Factors affecting potential to achieve goals and functional outcome are medical prognosis, previous level of function, severity of impairments, financial resources, and family/community support. Patient will benefit from skilled SLP services to address above impairments and improve overall function.  REHAB POTENTIAL: Good  PLAN: SLP FREQUENCY: 1-2x/week  SLP DURATION: 8 weeks  PLANNED INTERVENTIONS: Environmental controls, Cognitive reorganization, Internal/external aids, Functional tasks, SLP instruction and feedback, Compensatory strategies, and Patient/family education   Yecheskel Kurek B. ORutherford Nail M.S., CCC-SLP, CMining engineerCertified Brain Injury [place] APoint PleasantOffice [phone] [phone] [phone]

## 2022-06-02 NOTE — Therapy (Signed)
OUTPATIENT PHYSICAL THERAPY NEURO TREATMENT/RECERT   Patient Name: Tonya Keith MRN: [id number] DOB:[birth year], 62 y.o., female Today's Date: 06/03/2022   PCP: Sharyne Peach, MD  REFERRING PROVIDER: Vladimir Crofts, MD    PT End of Session - 06/03/22 1412     Visit Number 7    Number of Visits 16    Date for PT Re-Evaluation 06/03/22    Progress Note Due on Visit 10    PT Start Time 5732    PT Stop Time 1412    PT Time Calculation (min) 38 min    Equipment Utilized During Treatment Gait belt    Activity Tolerance Patient tolerated treatment well;No increased pain    Behavior During Therapy WFL for tasks assessed/performed                  Past Medical History:  Diagnosis Date   Anemia    Anginal pain ([place])    Breast cancer ([place]) 2008   chemo mastectomy left   Cancer ([facility])    breast   COPD (chronic obstructive pulmonary disease) ([place])    Coronary artery disease    History of left heart catheterization    w/ pci to RCA   Hx of migraine headaches    Hypertension    Myocardial infarction [facility]) 2010   Personal history of chemotherapy    Wears dentures    full upper (currently unable to wear)   Past Surgical History:  Procedure Laterality Date   ABDOMINAL HYSTERECTOMY     LEFT HEART CATH AND CORONARY ANGIOGRAPHY Left 09/07/2019   Procedure: LEFT HEART CATH AND CORONARY ANGIOGRAPHY;  Surgeon: Yolonda Kida, MD;  Location: [place] [facility];  Service: Cardiovascular;  Laterality: Left;   LESION EXCISION WITH COMPLEX REPAIR Right 12/04/2020   Procedure: Excision right upper lip intraoral lesion;  Surgeon: Clyde Canterbury, MD;  Location: [place];  Service: ENT;  Laterality: Right;  Latex   MASTECTOMY Left 2008   MINOR EXCISION OF ORAL LESION N/A 10/02/2020   Procedure: excision of right upper lip mass, intraoral;  Surgeon: Clyde Canterbury, MD;  Location: [place];  Service: ENT;  Laterality: N/A;  Latex   Patient Active Problem List    Diagnosis Date Noted   Hypothermia 12/03/2017   Bilateral lower extremity pain 11/17/2017   Bilateral lower extremity edema 11/17/2017   Tobacco abuse 11/17/2017   Essential hypertension 11/17/2017   Hyperlipidemia 11/17/2017    ONSET DATE: 10/24/20  REFERRING DIAG: R26.89 (ICD-10-CM) - Imbalance   THERAPY DIAG:  Unsteadiness on feet  Other abnormalities of gait and mobility  Muscle weakness (generalized)  Rationale for Evaluation and Treatment: Rehabilitation  SUBJECTIVE:  SUBJECTIVE STATEMENT:  Patient reports she is satisfied with her PT.   Pt accompanied by: self  PERTINENT HISTORY:  Pt reports imbalance that has started in the middle part of last year. Pt reports she has had 1 fall within the last 12 months where she fell and hit her head on the wall. She used to do a lot of walking but now she is afraid she will lose her balance and fall in the road if she attempts to go for a walk. Pt want to ensure she has the strength to take care of herself as she gets older. Pt wants to improve her balance with therapy and also wants to improve her strength so she can get stronger.   PAIN:  Are you having pain? Yes: NPRS scale: 6/10 Pain location: legs R > L with hip being primary pain site, pt does report generalized pain in her LE's bilaterally  Pain description: Achy, burning Aggravating factors: sleeping, walking prolonged distances, standing too long Relieving factors: rest, movement, elevation   PRECAUTIONS: Fall  WEIGHT BEARING RESTRICTIONS: No  FALLS: Has patient fallen in last 6 months? Yes. Number of falls 1  LIVING ENVIRONMENT: Lives with: lives with their family Lives in: House/apartment Stairs: No Has following equipment at home: None  PLOF: Independent  PATIENT GOALS:  improve balance, mobility and strength   OBJECTIVE:   DIAGNOSTIC FINDINGS:  MRI Brain impression. Age appropriate volume loss without lobar predominance.   COGNITION: Overall cognitive status: History of cognitive impairments - at baseline- memory therapy with [facility] currently   SENSATION: WFL  COORDINATION: Pin with heel to shin usign R LE, no s/s of incoordination  EDEMA:  None   MPOSTURE: No Significant postural limitations  LOWER EXTREMITY ROM:   R hip limited external rotaion although no measured, other joints WNL in LE   Active  Right Eval Left Eval  Hip flexion    Hip extension    Hip abduction    Hip adduction    Hip internal rotation    Hip external rotation    Knee flexion    Knee extension    Ankle dorsiflexion    Ankle plantarflexion    Ankle inversion    Ankle eversion     (Blank rows = not tested)  LOWER EXTREMITY MMT:    MMT Right Eval Left Eval  Hip flexion 3+* 4  Hip extension    Hip abduction 3+ 4  Hip adduction 3+ 4  Hip internal rotation    Hip external rotation    Knee flexion 4- 4  Knee extension 3+* 4  Ankle dorsiflexion 3+ 4  Ankle plantarflexion 4+ 4+  Ankle inversion    Ankle eversion    (Blank rows = not tested)  BED MOBILITY:  Pt has pain in her right hip with pain mobility.   TRANSFERS: Assistive device utilized: None  Sit to stand: Modified independence Stand to sit: Complete Independence Chair to chair: Modified independence Floor:  not tested   GAIT: Gait pattern: decreased step length- Left, decreased stance time- Right, scissoring, and antalgic Distance walked: 180 feet  Assistive device utilized: None Level of assistance: Modified independence Comments: intermittent scissoring gait, fatigued quickly with limitation by R hip pain.   FUNCTIONAL TESTS:  5 times sit to stand: 27.98 sec  6 minute walk test: unable to copmlete secondary to hip pain; 2 minute walk test 190 feet  10 meter walk test: .603 Berg  Balance Scale: 41  PATIENT SURVEYS:  FOTO 43 risk adjusted goal of 52   TODAY'S TREATMENT:   06/03/22  Goals assessed: see below:    education on proper donning of posture brace, fitting of posture brace PATIENT EDUCATION: Education details: POC Person educated: Patient Education method: Explanation Education comprehension: verbalized understanding  HOME EXERCISE PROGRAM: Access Code: [email or web address] URL: [email or web address] Date: 04/23/2022 Prepared by: Sande Brothers  Exercises - Hip Flexor Stretch on Step  - 1 x daily - 7 x weekly - 4 sets - 20-30 sec hold - Standing Hamstring Stretch with Step  - 1 x daily - 7 x weekly - 4 sets - 10 reps - 20-30 hold - Sit to Stand with Arms Crossed  - 1 x daily - 3 x weekly - 3 sets - 10 reps - Standing Heel Raises  - 1 x daily - 3 x weekly - 3 sets - 10 reps - Standing Hip Abduction with Counter Support  - 1 x daily - 7 x weekly - 3 sets - 10 reps  GOALS: Goals reviewed with patient? Yes  SHORT TERM GOALS: Target date: 05/06/2022  Patient will be independent in home exercise program to improve strength/mobility for better functional independence with ADLs. Baseline: No HEP currently  Goal status: INITIAL   LONG TERM GOALS: Target date: 06/03/22      1.  Patient (> 64 years old) will complete five times sit to stand test in < 15 seconds indicating an increased LE strength and improved balance. Baseline: 27.98sec 1/9: 13.4 seconds arms crossed Goal status: MET  2.  Patient will increase FOTO score to equal to or greater than  52   to demonstrate statistically significant improvement in mobility and quality of life.  Baseline: 43 1/9: 44 Goal status: Partially MET   3.  Patient will increase Berg Balance score by > 6 points to demonstrate decreased fall risk during functional activities. Baseline: 41 1/9: 53%  Goal status: MET  4.   Patient will increase 10 meter walk test to >.21ms as to improve gait speed  for better community ambulation and to reduce fall risk. Baseline: .603 m/s 1/9: 1.1 m/s  Goal status: MET   5.   Patient will increase 2 minute walk test distance to >260 feet for progression to community ambulator and improve gait ability Baseline: 190 ft 1/9: 400 ft  Goal status: MET   ASSESSMENT:  CLINICAL IMPRESSION:  Patient has met goals and is ready for discharge. Patient is agreeable to POC. We will be happy to see this patient again in the future as needed.    OBJECTIVE IMPAIRMENTS: Abnormal gait, decreased activity tolerance, decreased balance, decreased endurance, decreased mobility, difficulty walking, decreased strength, and hypomobility.   ACTIVITY LIMITATIONS: standing, squatting, bed mobility, and locomotion level  PARTICIPATION LIMITATIONS: cleaning, laundry, shopping, community activity, and yard work  PERSONAL FACTORS: 3+ comorbidities: HTN, MI, Bilateral LE pain  are also affecting patient's functional outcome.   REHAB POTENTIAL: Good  CLINICAL DECISION MAKING: Stable/uncomplicated  EVALUATION COMPLEXITY: Low  PLAN:  PT FREQUENCY: 1-2x/week  PT DURATION: 8 weeks  PLANNED INTERVENTIONS: Therapeutic exercises, Therapeutic activity, Neuromuscular re-education, Balance training, Gait training, Patient/Family education, Self Care, Joint mobilization, and Stair training  PLAN FOR NEXT SESSION: Continue POC  MJanna ArchPT  06/03/22, 2:21 PM

## 2022-06-03 ENCOUNTER — Ambulatory Visit: Payer: 59

## 2022-06-03 ENCOUNTER — Ambulatory Visit: Payer: 59 | Admitting: Speech Pathology

## 2022-06-03 DIAGNOSIS — R2681 Unsteadiness on feet: Secondary | ICD-10-CM | POA: Diagnosis not present

## 2022-06-03 DIAGNOSIS — M6281 Muscle weakness (generalized): Secondary | ICD-10-CM

## 2022-06-03 DIAGNOSIS — R41841 Cognitive communication deficit: Secondary | ICD-10-CM

## 2022-06-03 DIAGNOSIS — R2689 Other abnormalities of gait and mobility: Secondary | ICD-10-CM

## 2022-06-03 DIAGNOSIS — R413 Other amnesia: Secondary | ICD-10-CM

## 2022-06-04 NOTE — Therapy (Signed)
OUTPATIENT SPEECH LANGUAGE PATHOLOGY TREATMENT NOTE  DISCHARGE SUMMARY   Patient Name: Tonya Keith MRN: [id number] DOB:[birth year], 62 y.o., female Today's Date: 06/04/2022   PCP: Salome Holmes, MD REFERRING PROVIDER: Jennings Books, MD  END OF SESSION:    End of Session - 06/04/22 1249     Visit Number 6    Number of Visits 17    Date for SLP Re-Evaluation 06/03/22    Authorization Type [employer]    Authorization Time Period 04/08/2022 thru 06/03/2022    Progress Note Due on Visit 10    SLP Start Time 1500    SLP Stop Time  1600    SLP Time Calculation (min) 60 min    Activity Tolerance Patient tolerated treatment well                Past Medical History:  Diagnosis Date   Anemia    Anginal pain ([place])    Breast cancer ([place]) 2008   chemo mastectomy left   Cancer ([place])    breast   COPD (chronic obstructive pulmonary disease) ([place])    Coronary artery disease    History of left heart catheterization    w/ pci to RCA   Hx of migraine headaches    Hypertension    Myocardial infarction [facility]) 2010   Personal history of chemotherapy    Wears dentures    full upper (currently unable to wear)   Past Surgical History:  Procedure Laterality Date   ABDOMINAL HYSTERECTOMY     LEFT HEART CATH AND CORONARY ANGIOGRAPHY Left 09/07/2019   Procedure: LEFT HEART CATH AND CORONARY ANGIOGRAPHY;  Surgeon: Yolonda Kida, MD;  Location: [facility];  Service: Cardiovascular;  Laterality: Left;   LESION EXCISION WITH COMPLEX REPAIR Right 12/04/2020   Procedure: Excision right upper lip intraoral lesion;  Surgeon: Clyde Canterbury, MD;  Location: [facility];  Service: ENT;  Laterality: Right;  Latex   MASTECTOMY Left 2008   MINOR EXCISION OF ORAL LESION N/A 10/02/2020   Procedure: excision of right upper lip mass, intraoral;  Surgeon: Clyde Canterbury, MD;  Location: [facility];  Service: ENT;  Laterality: N/A;  Latex   Patient Active Problem List    Diagnosis Date Noted   Hypothermia 12/03/2017   Bilateral lower extremity pain 11/17/2017   Bilateral lower extremity edema 11/17/2017   Tobacco abuse 11/17/2017   Essential hypertension 11/17/2017   Hyperlipidemia 11/17/2017    ONSET DATE: referral date 02/18/2022; memory loss dating back to 2022   REFERRING DIAG: R41.3 (ICD-10-CM) - Memory loss or impairment    PERTINENT HISTORY:  Memory loss -  Onset has been gradual since 2022 Headaches - in a patient with history of migraines since early age years with increased headache in past several years + occasional pulsatile tinnitus, valsalva induced headache + partially empty sella -concerning for component of intracranial hypertension     DIAGNOSTIC FINDINGS:    MRV Head Without Contrast 03/14/2022 -  1. Age appropriate volume loss without lobar predominance. See  neuroquant analysis for additional findings.  2. Partially empty sella and mild tonsillar ectopia measuring up to  7 mm. Correlate with symptoms of idiopathic intracranial  hypertension.  3. Left sphenoid sinusitis.  4. No evidence of dural venous sinus thromobosis.    Cerebellar Tonsillar Ectopia 85m    SLUMS -  02/13/2022 was 20/30 Difficulty with mental math problem solving; clock drawing and attention (digits backwards) THERAPY DIAG:  Cognitive communication deficit  Memory loss or impairment  Cognitive impairment  Rationale for Evaluation and Treatment Rehabilitation  SUBJECTIVE: pt pleasant, concerned about transportation  Pt accompanied by: self  PAIN:  Are you having pain? No  PATIENT GOALS: pt unable to state  OBJECTIVE:   TODAY'S TREATMENT:  Skilled treatment session focused on pt's cognitive impairment, specifically her memory loss. SLP facilitated the session by providing the following activities:  In conversation with pt, it appears that her cognitive abilities are functional within her living situation. In an effort to assess pt's ability  across multiple household activities, portions of the Patient Competency Rating Form were given. Pt provided the following answers:  1.) How much of a problem do I have in preparing my own meals?  "Sometimes my hands give out - sometimes I feel staggering, feel like I might fall into the stove" 2.) How much of a problem do I have in dressing myself?  "Slow, difficulty moving my arms above my head" 3.) How much of a problem do I have in taking care of my personal hygiene?  "Scared to step over into the tub, so I baths at the sink" 4.) How much of a problem do I have in washing the dishes?  "Sometimes I have to sat down" 5.) How much of a problem do I have in doing the laundry?  "Take piece by piece, trouble leaning over and reaching clothes" 6.) How much of a problem do I have in taking care of my finances?  "My niece and daughter help me with that" 7.) How much of a problem do I have in keeping appointments on time?  "I forget sometimes, I really do but I hasn't missed any appts. My niece comes over every morning to check on me and I get my appt book and we look it over."    PATIENT EDUCATION: Education details: external memory aids Person educated: Patient Education method: Explanation Education comprehension: verbalized understanding and needs further education  HOME EXERCISE PROGRAM:   Complete seasonal word searches  GOALS: Goals reviewed with patient? Yes   SHORT TERM GOALS: Target date: 10 sessions   Given a functional problem (ie., social problem, multi-step math word problem, logic puzzle) or scenario from daily life, patient will demonstrate problem solving with > 80% accuracy and rare Min A.  Baseline: Goal status: MET   Pt will use strategies to improve memory for important information with 75% acc. with minimal assistance (ie., white board, daily planner/calendar, Apps on phone).  Baseline: Goal status: MET     LONG TERM GOALS: Target date: 06/03/2022 Given a  functional problem a scenario from daily life, patient will demonstrate error awareness and correct errors independently with at least 90% acc.              Baseline:  Goal status: MET   2.  Pt will use strategies to improve memory for important information with 90% acc. Independently(ie., white board, daily planner/calendar, Apps on phone).  Baseline:  Goal status: MET  ASSESSMENT:  CLINICAL IMPRESSION:  Pt presents with improving recall of information and continues to be eager to employ internal and external memory aids. As indicated by the above report by t, her cognitive abilities are not preventing her from completing household tasks.   Shelbie Franken B. Rutherford Nail, M.S., CCC-SLP, Mining engineer Certified Brain Injury [place]  [place] Office [phone] Ascom [phone] Fax [phone]

## 2022-06-05 ENCOUNTER — Encounter: Payer: Medicare Other | Admitting: Speech Pathology

## 2022-06-05 ENCOUNTER — Ambulatory Visit: Payer: 59 | Admitting: Physical Therapy

## 2022-06-10 ENCOUNTER — Encounter: Payer: Medicare Other | Admitting: Speech Pathology

## 2022-06-10 ENCOUNTER — Ambulatory Visit: Payer: Medicare Other

## 2022-06-12 ENCOUNTER — Ambulatory Visit: Payer: Medicare Other

## 2022-06-12 ENCOUNTER — Encounter: Payer: Medicare Other | Admitting: Speech Pathology

## 2022-06-17 ENCOUNTER — Encounter: Payer: Medicare Other | Admitting: Speech Pathology

## 2022-06-17 ENCOUNTER — Ambulatory Visit: Payer: Medicare Other

## 2022-06-17 ENCOUNTER — Ambulatory Visit: Payer: Medicare Other | Admitting: Physical Therapy

## 2022-06-19 ENCOUNTER — Ambulatory Visit: Payer: Medicare Other | Admitting: Physical Therapy

## 2022-06-24 ENCOUNTER — Ambulatory Visit: Payer: Medicare Other | Admitting: Physical Therapy

## 2022-06-24 ENCOUNTER — Encounter: Payer: Medicare Other | Admitting: Speech Pathology

## 2022-06-26 ENCOUNTER — Encounter: Payer: Medicare Other | Admitting: Speech Pathology

## 2022-07-01 ENCOUNTER — Ambulatory Visit: Payer: Medicare Other | Admitting: Physical Therapy

## 2022-07-01 ENCOUNTER — Encounter: Payer: Medicare Other | Admitting: Speech Pathology

## 2022-07-03 ENCOUNTER — Encounter: Payer: Medicare Other | Admitting: Speech Pathology

## 2022-07-03 ENCOUNTER — Ambulatory Visit: Payer: Medicare Other

## 2022-07-08 ENCOUNTER — Ambulatory Visit: Payer: Medicare Other | Admitting: Physical Therapy

## 2022-07-08 ENCOUNTER — Encounter: Payer: Medicare Other | Admitting: Speech Pathology

## 2022-07-10 ENCOUNTER — Ambulatory Visit: Payer: Medicare Other

## 2022-07-10 ENCOUNTER — Encounter: Payer: Medicare Other | Admitting: Speech Pathology

## 2022-07-15 ENCOUNTER — Encounter: Payer: Medicare Other | Admitting: Speech Pathology

## 2022-07-15 ENCOUNTER — Ambulatory Visit: Payer: Medicare Other | Admitting: Physical Therapy

## 2022-07-17 ENCOUNTER — Encounter: Payer: Medicare Other | Admitting: Speech Pathology

## 2022-07-17 ENCOUNTER — Ambulatory Visit: Payer: Medicare Other

## 2022-07-22 ENCOUNTER — Ambulatory Visit: Payer: Medicare Other | Admitting: Physical Therapy

## 2022-07-22 ENCOUNTER — Encounter: Payer: Medicare Other | Admitting: Speech Pathology

## 2022-07-24 ENCOUNTER — Ambulatory Visit: Payer: Medicare Other

## 2022-07-24 ENCOUNTER — Encounter: Payer: Medicare Other | Admitting: Speech Pathology

## 2022-07-29 ENCOUNTER — Encounter: Payer: Medicare Other | Admitting: Speech Pathology

## 2022-07-29 ENCOUNTER — Ambulatory Visit: Payer: Medicare Other | Admitting: Physical Therapy

## 2022-07-31 ENCOUNTER — Encounter: Payer: Medicare Other | Admitting: Speech Pathology

## 2022-07-31 ENCOUNTER — Ambulatory Visit: Payer: Medicare Other

## 2022-08-05 ENCOUNTER — Encounter: Payer: Medicare Other | Admitting: Speech Pathology

## 2022-08-05 ENCOUNTER — Ambulatory Visit: Payer: Medicare Other | Admitting: Physical Therapy

## 2022-08-07 ENCOUNTER — Ambulatory Visit: Payer: Medicare Other

## 2022-08-07 ENCOUNTER — Encounter: Payer: Medicare Other | Admitting: Speech Pathology

## 2022-08-12 ENCOUNTER — Encounter: Payer: Medicare Other | Admitting: Speech Pathology

## 2022-08-12 ENCOUNTER — Ambulatory Visit: Payer: Medicare Other | Admitting: Physical Therapy

## 2022-08-12 ENCOUNTER — Ambulatory Visit: Payer: 59 | Attending: Otolaryngology

## 2022-08-12 DIAGNOSIS — R519 Headache, unspecified: Secondary | ICD-10-CM | POA: Insufficient documentation

## 2022-08-12 DIAGNOSIS — R0683 Snoring: Secondary | ICD-10-CM | POA: Insufficient documentation

## 2022-08-12 DIAGNOSIS — G4733 Obstructive sleep apnea (adult) (pediatric): Secondary | ICD-10-CM | POA: Insufficient documentation

## 2022-08-12 DIAGNOSIS — I10 Essential (primary) hypertension: Secondary | ICD-10-CM | POA: Diagnosis not present

## 2022-08-14 ENCOUNTER — Ambulatory Visit: Payer: Medicare Other

## 2022-08-14 ENCOUNTER — Encounter: Payer: Medicare Other | Admitting: Speech Pathology

## 2022-08-15 DIAGNOSIS — R413 Other amnesia: Secondary | ICD-10-CM | POA: Insufficient documentation

## 2022-08-27 ENCOUNTER — Ambulatory Visit
Admission: RE | Admit: 2022-08-27 | Discharge: 2022-08-27 | Disposition: A | Discharge status: Home / Self Care | Payer: 59 | Source: Ambulatory Visit | Attending: Family Medicine | Admitting: Family Medicine

## 2022-08-27 DIAGNOSIS — Z780 Asymptomatic menopausal state: Secondary | ICD-10-CM

## 2022-09-21 DIAGNOSIS — Z789 Other specified health status: Secondary | ICD-10-CM | POA: Insufficient documentation

## 2022-09-21 DIAGNOSIS — Z79899 Other long term (current) drug therapy: Secondary | ICD-10-CM | POA: Insufficient documentation

## 2022-09-21 DIAGNOSIS — M899 Disorder of bone, unspecified: Secondary | ICD-10-CM | POA: Insufficient documentation

## 2022-09-21 DIAGNOSIS — R011 Cardiac murmur, unspecified: Secondary | ICD-10-CM | POA: Insufficient documentation

## 2022-09-21 DIAGNOSIS — I219 Acute myocardial infarction, unspecified: Secondary | ICD-10-CM | POA: Insufficient documentation

## 2022-09-21 DIAGNOSIS — G894 Chronic pain syndrome: Secondary | ICD-10-CM | POA: Insufficient documentation

## 2022-09-21 NOTE — Progress Notes (Unsigned)
Patient: Tonya Keith  Service Category: E/M  Provider: Oswaldo Done, MD  DOB: [birth year]  DOS: 09/22/2022  Referring Provider: Levie Heritage  MRN: [id number]  Setting: Ambulatory outpatient  PCP: Rayetta Humphrey, MD  Type: New Patient  Specialty: Interventional Pain Management    Location: Office  Delivery: Face-to-face     Primary Reason(s) for Visit: Encounter for initial evaluation of one or more chronic problems (new to examiner) potentially causing chronic pain, and posing a threat to normal musculoskeletal function. (Level of risk: High) CC: No chief complaint on file.  HPI  Tonya Keith is a 62 y.o. year old, female patient, who comes for the first time to our practice referred by Janice Coffin, PA-C for our initial evaluation of her chronic pain. She has Bilateral lower extremity pain; Bilateral lower extremity edema; Moderate tobacco use disorder; Hypertension; Hypercholesterolemia; Hypothermia; Chronic obstructive pulmonary disease (HCC); Coronary artery disease involving native coronary artery of native heart without angina pectoris; Heart murmur; Hemoglobin C trait (HCC); Major depressive disorder, recurrent, moderate (HCC); Malignant neoplasm of left female breast (HCC); Memory impairment; Myocardial infarction (HCC); Osteopenia; Other insomnia; Type 2 diabetes mellitus (HCC); Vitamin D insufficiency; Chronic pain syndrome; Pharmacologic therapy; Disorder of skeletal system; and Problems influencing health status on their problem list. Today she comes in for evaluation of her No chief complaint on file.  Pain Assessment: Location:     Radiating:   Onset:   Duration:   Quality:   Severity:  /10 (subjective, self-reported pain score)  Effect on ADL:   Timing:   Modifying factors:   BP:    HR:    Onset and Duration: {Hx; Onset and Duration:210120511} Cause of pain: {Hx; Cause:210120521} Severity: {Pain Severity:210120502} Timing: {Symptoms;  Timing:210120501} Aggravating Factors: {Causes; Aggravating pain factors:210120507} Alleviating Factors: {Causes; Alleviating Factors:210120500} Associated Problems: {Hx; Associated problems:210120515} Quality of Pain: {Hx; Symptom quality or Descriptor:210120531} Previous Examinations or Tests: {Hx; Previous examinations or test:210120529} Previous Treatments: {Hx; Previous Treatment:210120503}  Tonya Keith is being evaluated for possible interventional pain management therapies for the treatment of her chronic pain.   ***  Tonya Keith has been informed that this initial visit was an evaluation only.  On the follow up appointment I will go over the results, including ordered tests and available interventional therapies. At that time she will have the opportunity to decide whether to proceed with offered therapies or not. In the event that Ms. Beckum prefers avoiding interventional options, this will conclude our involvement in the case.  Medication management recommendations may be provided upon request.  Historic Controlled Substance Pharmacotherapy Review  PMP and historical list of controlled substances: ***  Most recently prescribed opioid analgesics:   *** MME/day: *** mg/day  Historical Monitoring: The patient  reports current drug use. Drug: Marijuana. List of prior UDS Testing: Lab Results  Component Value Date   MDMA NONE DETECTED 12/03/2017   COCAINSCRNUR NONE DETECTED 12/03/2017   PCPSCRNUR NONE DETECTED 12/03/2017   THCU POSITIVE (A) 12/03/2017   Historical Background Evaluation: [place] PMP: PDMP reviewed during this encounter. Review of the past 63-months conducted.             PMP NARX Score Report:  Narcotic: 120 Sedative: 070 Stimulant: 000 [place] Department of public safety, offender search: Engineer, mining Information) Non-contributory Risk Assessment Profile: Aberrant behavior: None observed or detected today Risk factors for fatal opioid overdose: None identified today PMP NARX  Overdose Risk Score: 200 Fatal overdose hazard ratio (HR): Calculation deferred Non-fatal overdose  hazard ratio (HR): Calculation deferred Risk of opioid abuse or dependence: 0.7-3.0% with doses ? 36 MME/day and 6.1-26% with doses ? 120 MME/day. Substance use disorder (SUD) risk level: See below Personal History of Substance Abuse (SUD-Substance use disorder):  Alcohol:    Illegal Drugs:    Rx Drugs:    ORT Risk Level calculation:    ORT Scoring interpretation table:  Score <3 = Low Risk for SUD  Score between 4-7 = Moderate Risk for SUD  Score >8 = High Risk for Opioid Abuse   PHQ-2 Depression Scale:  Total score:    PHQ-2 Scoring interpretation table: (Score and probability of major depressive disorder)  Score 0 = No depression  Score 1 = 15.4% Probability  Score 2 = 21.1% Probability  Score 3 = 38.4% Probability  Score 4 = 45.5% Probability  Score 5 = 56.4% Probability  Score 6 = 78.6% Probability   PHQ-9 Depression Scale:  Total score:    PHQ-9 Scoring interpretation table:  Score 0-4 = No depression  Score 5-9 = Mild depression  Score 10-14 = Moderate depression  Score 15-19 = Moderately severe depression  Score 20-27 = Severe depression (2.4 times higher risk of SUD and 2.89 times higher risk of overuse)   Pharmacologic Plan: As per protocol, I have not taken over any controlled substance management, pending the results of ordered tests and/or consults.            Initial impression: Pending review of available data and ordered tests.  Meds   Current Outpatient Medications:    albuterol (VENTOLIN HFA) 108 (90 Base) MCG/ACT inhaler, Inhale 2 puffs into the lungs every 6 (six) hours as needed for wheezing or shortness of breath., Disp: , Rfl:    amLODipine (NORVASC) 10 MG tablet, Take 10 mg by mouth daily. , Disp: , Rfl:    amoxicillin-clavulanate (AUGMENTIN) 875-125 MG tablet, Take 1 tablet by mouth 2 (two) times daily., Disp: 20 tablet, Rfl: 0   aspirin EC 81 MG  tablet, Take 81 mg by mouth daily. , Disp: , Rfl:    cetirizine (ZYRTEC) 10 MG tablet, Take 10 mg by mouth daily. , Disp: , Rfl:    cloNIDine (CATAPRES) 0.2 MG tablet, Take 0.2 mg by mouth 2 (two) times daily. , Disp: , Rfl:    escitalopram (LEXAPRO) 20 MG tablet, Take 20 mg by mouth daily., Disp: , Rfl:    ferrous sulfate 324 MG TBEC, Take 324 mg by mouth., Disp: , Rfl:    HYDROcodone-acetaminophen (NORCO/VICODIN) 5-325 MG tablet, Take 1-2 tablets by mouth every 6 (six) hours as needed for moderate pain., Disp: 30 tablet, Rfl: 0   ibuprofen (ADVIL) 200 MG tablet, Take 200-400 mg by mouth every 6 (six) hours as needed for headache or moderate pain., Disp: , Rfl:    ipratropium (ATROVENT) 0.03 % nasal spray, Place 2 sprays into both nostrils 2 (two) times daily., Disp: , Rfl:    isosorbide mononitrate (IMDUR) 30 MG 24 hr tablet, Take 30 mg by mouth daily., Disp: , Rfl:    lovastatin (MEVACOR) 40 MG tablet, Take 40 mg by mouth daily. , Disp: , Rfl:    metoprolol succinate (TOPROL-XL) 100 MG 24 hr tablet, Take 50 mg by mouth daily., Disp: , Rfl:    Vitamin D, Ergocalciferol, (DRISDOL) 1.25 MG (50000 UNIT) CAPS capsule, Take 50,000 Units by mouth every Wednesday., Disp: , Rfl:   Imaging Review  Cervical Imaging: Cervical MR wo contrast: Results for orders placed  during the hospital encounter of 04/07/22  MR CERVICAL SPINE WO CONTRAST  Narrative CLINICAL DATA:  Chronic headaches  EXAM: MRI CERVICAL SPINE WITHOUT CONTRAST  TECHNIQUE: Multiplanar, multisequence MR imaging of the cervical spine was performed. No intravenous contrast was administered.  COMPARISON:  08/04/2007  FINDINGS: Despite efforts by the technologist and patient, motion artifact is present on today's exam and could not be eliminated. This reduces exam sensitivity and specificity.  Alignment: No vertebral subluxation is observed.  Vertebrae: Mild disc desiccation throughout the cervical spine. No significant  vertebral marrow edema is identified.  Cord: No significant abnormal spinal cord signal is observed.  Posterior Fossa, vertebral arteries, paraspinal tissues: The cerebellar tonsils extend about 5 mm below the foramen magnum, as discussed on prior brain MRI.  Disc levels:  C2-3: No impingement. The intervertebral disc at this level is somewhat small although this may be congenital.  C3-4: No impingement. Mild degenerative facet arthropathy on the right.  C4-5: Unremarkable.  C5-6: No impingement.  Small right paracentral disc protrusion  C6-7: Unremarkable  C7-T1: Unremarkable  IMPRESSION: 1. No impingement in the cervical spine. 2. Mild degenerative facet arthropathy on the right at C3-4. 3. Small right paracentral disc protrusion at C5-6, without impingement. 4. The cerebellar tonsils extend about 5 mm below the foramen magnum, as discussed on prior brain MRI.   Electronically Signed By: Gaylyn Rong M.D. On: 04/08/2022 16:15  Cervical MR wo contrast: No valid procedures specified. Cervical MR w/wo contrast: No results found for this or any previous visit.  Cervical MR w contrast: No results found for this or any previous visit.  Cervical CT wo contrast: No results found for this or any previous visit.  Cervical CT w/wo contrast: No results found for this or any previous visit.  Cervical CT w/wo contrast: No results found for this or any previous visit.  Cervical CT w contrast: No results found for this or any previous visit.  Cervical CT outside: No results found for this or any previous visit.  Cervical DG 1 view: No results found for this or any previous visit.  Cervical DG 2-3 views: No results found for this or any previous visit.  Cervical DG F/E views: No results found for this or any previous visit.  Cervical DG 2-3 clearing views: No results found for this or any previous visit.  Cervical DG Bending/F/E views: No results found for this or  any previous visit.  Cervical DG complete: No results found for this or any previous visit.  Cervical DG Myelogram views: No results found for this or any previous visit.  Cervical DG Myelogram views: No results found for this or any previous visit.  Cervical Discogram views: No results found for this or any previous visit.   Shoulder Imaging: Shoulder-R MR w contrast: No results found for this or any previous visit.  Shoulder-L MR w contrast: No results found for this or any previous visit.  Shoulder-R MR w/wo contrast: No results found for this or any previous visit.  Shoulder-L MR w/wo contrast: No results found for this or any previous visit.  Shoulder-R MR wo contrast: No results found for this or any previous visit.  Shoulder-L MR wo contrast: No results found for this or any previous visit.  Shoulder-R CT w contrast: No results found for this or any previous visit.  Shoulder-L CT w contrast: No results found for this or any previous visit.  Shoulder-R CT w/wo contrast: No results found for this or any  previous visit.  Shoulder-L CT w/wo contrast: No results found for this or any previous visit.  Shoulder-R CT wo contrast: No results found for this or any previous visit.  Shoulder-L CT wo contrast: No results found for this or any previous visit.  Shoulder-R DG Arthrogram: No results found for this or any previous visit.  Shoulder-L DG Arthrogram: No results found for this or any previous visit.  Shoulder-R DG 1 view: No results found for this or any previous visit.  Shoulder-L DG 1 view: No results found for this or any previous visit.  Shoulder-R DG: No results found for this or any previous visit.  Shoulder-L DG: No results found for this or any previous visit.   Thoracic Imaging: Thoracic MR wo contrast: No results found for this or any previous visit.  Thoracic MR wo contrast: No valid procedures specified. Thoracic MR w/wo contrast: No results found  for this or any previous visit.  Thoracic MR w contrast: No results found for this or any previous visit.  Thoracic CT wo contrast: No results found for this or any previous visit.  Thoracic CT w/wo contrast: No results found for this or any previous visit.  Thoracic CT w/wo contrast: No results found for this or any previous visit.  Thoracic CT w contrast: No results found for this or any previous visit.  Thoracic DG 2-3 views: No results found for this or any previous visit.  Thoracic DG 4 views: No results found for this or any previous visit.  Thoracic DG: No results found for this or any previous visit.  Thoracic DG w/swimmers view: No results found for this or any previous visit.  Thoracic DG Myelogram views: No results found for this or any previous visit.  Thoracic DG Myelogram views: No results found for this or any previous visit.   Lumbosacral Imaging: Lumbar MR wo contrast: No results found for this or any previous visit.  Lumbar MR wo contrast: No valid procedures specified. Lumbar MR w/wo contrast: No results found for this or any previous visit.  Lumbar MR w/wo contrast: No results found for this or any previous visit.  Lumbar MR w contrast: No results found for this or any previous visit.  Lumbar CT wo contrast: No results found for this or any previous visit.  Lumbar CT w/wo contrast: No results found for this or any previous visit.  Lumbar CT w/wo contrast: No results found for this or any previous visit.  Lumbar CT w contrast: No results found for this or any previous visit.  Lumbar DG 1V: No results found for this or any previous visit.  Lumbar DG 1V (Clearing): No results found for this or any previous visit.  Lumbar DG 2-3V (Clearing): No results found for this or any previous visit.  Lumbar DG 2-3 views: No results found for this or any previous visit.  Lumbar DG (Complete) 4+V: No results found for this or any previous visit.        Lumbar  DG F/E views: No results found for this or any previous visit.        Lumbar DG Bending views: No results found for this or any previous visit.        Lumbar DG Myelogram views: No results found for this or any previous visit.  Lumbar DG Myelogram: No results found for this or any previous visit.  Lumbar DG Myelogram: No results found for this or any previous visit.  Lumbar DG Myelogram: No results found  for this or any previous visit.  Lumbar DG Myelogram Lumbosacral: No results found for this or any previous visit.  Lumbar DG Diskogram views: No results found for this or any previous visit.  Lumbar DG Diskogram views: No results found for this or any previous visit.  Lumbar DG Epidurogram OP: No results found for this or any previous visit.  Lumbar DG Epidurogram IP: No valid procedures specified.  Sacroiliac Joint Imaging: Sacroiliac Joint DG: No results found for this or any previous visit.  Sacroiliac Joint MR w/wo contrast: No results found for this or any previous visit.  Sacroiliac Joint MR wo contrast: No results found for this or any previous visit.   Spine Imaging: Whole Spine DG Myelogram views: No results found for this or any previous visit.  Whole Spine MR Mets screen: No results found for this or any previous visit.  Whole Spine MR Mets screen: No results found for this or any previous visit.  Whole Spine MR w/wo: No results found for this or any previous visit.  MRA Spinal Canal w/ cm: No results found for this or any previous visit.  MRA Spinal Canal wo/ cm: No valid procedures specified. MRA Spinal Canal w/wo cm: No results found for this or any previous visit.  Spine Outside MR Films: No results found for this or any previous visit.  Spine Outside CT Films: No results found for this or any previous visit.  CT-Guided Biopsy: No results found for this or any previous visit.  CT-Guided Needle Placement: No results found for this or any previous  visit.  DG Spine outside: No results found for this or any previous visit.  IR Spine outside: No results found for this or any previous visit.  NM Spine outside: No results found for this or any previous visit.   Hip Imaging: Hip-R MR w contrast: No results found for this or any previous visit.  Hip-L MR w contrast: No results found for this or any previous visit.  Hip-R MR w/wo contrast: No results found for this or any previous visit.  Hip-L MR w/wo contrast: No results found for this or any previous visit.  Hip-R MR wo contrast: No results found for this or any previous visit.  Hip-L MR wo contrast: No results found for this or any previous visit.  Hip-R CT w contrast: No results found for this or any previous visit.  Hip-L CT w contrast: No results found for this or any previous visit.  Hip-R CT w/wo contrast: No results found for this or any previous visit.  Hip-L CT w/wo contrast: No results found for this or any previous visit.  Hip-R CT wo contrast: No results found for this or any previous visit.  Hip-L CT wo contrast: No results found for this or any previous visit.  Hip-R DG 2-3 views: No results found for this or any previous visit.  Hip-L DG 2-3 views: No results found for this or any previous visit.  Hip-R DG Arthrogram: No results found for this or any previous visit.  Hip-L DG Arthrogram: No results found for this or any previous visit.  Hip-B DG Bilateral: No results found for this or any previous visit.   Knee Imaging: Knee-R MR w contrast: No results found for this or any previous visit.  Knee-L MR w/o contrast: No results found for this or any previous visit.  Knee-R MR w/wo contrast: No results found for this or any previous visit.  Knee-L MR w/wo contrast: No  results found for this or any previous visit.  Knee-R MR wo contrast: No results found for this or any previous visit.  Knee-L MR wo contrast: No results found for this or any  previous visit.  Knee-R CT w contrast: No results found for this or any previous visit.  Knee-L CT w contrast: No results found for this or any previous visit.  Knee-R CT w/wo contrast: No results found for this or any previous visit.  Knee-L CT w/wo contrast: No results found for this or any previous visit.  Knee-R CT wo contrast: No results found for this or any previous visit.  Knee-L CT wo contrast: No results found for this or any previous visit.  Knee-R DG 1-2 views: No results found for this or any previous visit.  Knee-L DG 1-2 views: No results found for this or any previous visit.  Knee-R DG 3 views: No results found for this or any previous visit.  Knee-L DG 3 views: No results found for this or any previous visit.  Knee-R DG 4 views: Results for orders placed during the hospital encounter of 10/13/19  DG Knee Complete 4 Views Right  Narrative CLINICAL DATA:  Chronic pain  EXAM: RIGHT KNEE - COMPLETE 4+ VIEW  COMPARISON:  October 31, 2016  FINDINGS: Frontal, lateral, tunnel, and sunrise patellar images were obtained. There is no fracture, dislocation, or joint effusion. The joint spaces appear unremarkable. No erosive change.  IMPRESSION: No fracture, dislocation, or joint effusion. No evident arthropathy.   Electronically Signed By: Bretta Bang III M.D. On: 10/14/2019 08:29  Knee-L DG 4 views: Results for orders placed during the hospital encounter of 10/13/19  DG Knee Complete 4 Views Left  Narrative CLINICAL DATA:  Chronic pain  EXAM: LEFT KNEE - COMPLETE 4+ VIEW  COMPARISON:  None.  FINDINGS: Frontal, lateral, tunnel, and sunrise patellar images obtained. No fracture, dislocation, or joint effusion. Joint spaces appear unremarkable. No erosive change.  IMPRESSION: No fracture, dislocation, or joint effusion. No evident arthropathy.   Electronically Signed By: Bretta Bang III M.D. On: 10/14/2019 08:30  Knee-R DG Arthrogram:  No results found for this or any previous visit.  Knee-L DG Arthrogram: No results found for this or any previous visit.   Ankle Imaging: Ankle-R DG Complete: No results found for this or any previous visit.  Ankle-L DG Complete: No results found for this or any previous visit.   Foot Imaging: Foot-R DG Complete: No results found for this or any previous visit.  Foot-L DG Complete: No results found for this or any previous visit.   Elbow Imaging: Elbow-R DG Complete: No results found for this or any previous visit.  Elbow-L DG Complete: No results found for this or any previous visit.   Wrist Imaging: Wrist-R DG Complete: No results found for this or any previous visit.  Wrist-L DG Complete: No results found for this or any previous visit.   Hand Imaging: Hand-R DG Complete: No results found for this or any previous visit.  Hand-L DG Complete: No results found for this or any previous visit.   Complexity Note: Imaging results reviewed.                         ROS  Cardiovascular: {Hx; Cardiovascular History:210120525} Pulmonary or Respiratory: {Hx; Pumonary and/or Respiratory History:210120523} Neurological: {Hx; Neurological:210120504} Psychological-Psychiatric: {Hx; Psychological-Psychiatric History:210120512} Gastrointestinal: {Hx; Gastrointestinal:210120527} Genitourinary: {Hx; Genitourinary:210120506} Hematological: {Hx; Hematological:210120510} Endocrine: {Hx; Endocrine history:210120509} Rheumatologic: {Hx; Rheumatological:210120530} Musculoskeletal: {  Hx; Musculoskeletal:210120528} Work History: {Hx; Work history:210120514}  Allergies  Ms. Cwikla is allergic to pollen extract and latex.  Laboratory Chemistry Profile   Renal Lab Results  Component Value Date   BUN 16 01/13/2020   CREATININE 0.85 01/13/2020   GFRAA >60 01/13/2020   GFRNONAA >60 01/13/2020   PROTEINUR 30 (A) 01/04/2019     Electrolytes Lab Results  Component Value Date   NA 139  01/13/2020   K 3.9 01/13/2020   CL 104 01/13/2020   CALCIUM 9.1 01/13/2020     Hepatic Lab Results  Component Value Date   AST 20 01/13/2020   ALT 16 01/13/2020   ALBUMIN 4.6 01/13/2020   ALKPHOS 78 01/13/2020     ID Lab Results  Component Value Date   HIV Non Reactive 12/04/2017   SARSCOV2NAA NEGATIVE 09/02/2019     Bone No results found for: "VD25OH", "VD125OH2TOT", "ZO1096EA5", "WU9811BJ4", "25OHVITD1", "25OHVITD2", "25OHVITD3", "TESTOFREE", "TESTOSTERONE"   Endocrine Lab Results  Component Value Date   GLUCOSE 167 (H) 01/13/2020   GLUCOSEU NEGATIVE 01/04/2019   TSH 0.950 12/03/2017   CRTSLPL 5.7 12/04/2017     Neuropathy Lab Results  Component Value Date   HIV Non Reactive 12/04/2017     CNS No results found for: "COLORCSF", "APPEARCSF", "RBCCOUNTCSF", "WBCCSF", "POLYSCSF", "LYMPHSCSF", "EOSCSF", "PROTEINCSF", "GLUCCSF", "JCVIRUS", "CSFOLI", "IGGCSF", "LABACHR", "ACETBL"   Inflammation (CRP: Acute  ESR: Chronic) Lab Results  Component Value Date   LATICACIDVEN 1.7 12/03/2017     Rheumatology No results found for: "RF", "ANA", "LABURIC", "URICUR", "LYMEIGGIGMAB", "LYMEABIGMQN", "HLAB27"   Coagulation Lab Results  Component Value Date   INR 0.9 01/13/2020   LABPROT 11.4 01/13/2020   APTT 28 01/13/2020   PLT 238 01/13/2020     Cardiovascular Lab Results  Component Value Date   BNP 46.0 08/23/2019   TROPONINI <0.03 12/03/2017   HGB 14.3 01/13/2020   HCT 40.1 01/13/2020     Screening Lab Results  Component Value Date   SARSCOV2NAA NEGATIVE 09/02/2019   HIV Non Reactive 12/04/2017     Cancer Lab Results  Component Value Date   LABCA2 21.2 05/04/2014     Allergens No results found for: "ALMOND", "APPLE", "ASPARAGUS", "AVOCADO", "BANANA", "BARLEY", "BASIL", "BAYLEAF", "GREENBEAN", "LIMABEAN", "WHITEBEAN", "BEEFIGE", "REDBEET", "BLUEBERRY", "BROCCOLI", "CABBAGE", "MELON", "CARROT", "CASEIN", "CASHEWNUT", "CAULIFLOWER", "CELERY"     Note: Lab  results reviewed.  PFSH  Drug: Ms. Nass  reports current drug use. Drug: Marijuana. Alcohol:  reports current alcohol use. Tobacco:  reports that she has been smoking cigarettes. She has a 13.50 pack-year smoking history. She has quit using smokeless tobacco.  Her smokeless tobacco use included snuff. Medical:  has a past medical history of Anemia, Anginal pain (HCC), Breast cancer (HCC) (2008), Cancer (HCC), COPD (chronic obstructive pulmonary disease) (HCC), Coronary artery disease, History of left heart catheterization, migraine headaches, Hypertension, Myocardial infarction (HCC) (2010), Personal history of chemotherapy, and Wears dentures. Family: family history includes Breast cancer in her maternal aunt; Cancer in her sister; Heart disease in her father; Hypertension in her father.  Past Surgical History:  Procedure Laterality Date   ABDOMINAL HYSTERECTOMY     LEFT HEART CATH AND CORONARY ANGIOGRAPHY Left 09/07/2019   Procedure: LEFT HEART CATH AND CORONARY ANGIOGRAPHY;  Surgeon: Alwyn Pea, MD;  Location: [facility] INVASIVE CV LAB;  Service: Cardiovascular;  Laterality: Left;   LESION EXCISION WITH COMPLEX REPAIR Right 12/04/2020   Procedure: Excision right upper lip intraoral lesion;  Surgeon: Geanie Logan, MD;  Location: [facility];  Service: ENT;  Laterality: Right;  Latex   MASTECTOMY Left 2008   MINOR EXCISION OF ORAL LESION N/A 10/02/2020   Procedure: excision of right upper lip mass, intraoral;  Surgeon: Geanie Logan, MD;  Location: [facility];  Service: ENT;  Laterality: N/A;  Latex   Active Ambulatory Problems    Diagnosis Date Noted   Bilateral lower extremity pain 11/17/2017   Bilateral lower extremity edema 11/17/2017   Moderate tobacco use disorder 11/17/2017   Hypertension 11/17/2017   Hypercholesterolemia 11/17/2017   Hypothermia 12/03/2017   Chronic obstructive pulmonary disease (HCC) 03/06/2019   Coronary artery disease involving native  coronary artery of native heart without angina pectoris 03/06/2019   Heart murmur 09/21/2022   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   Major depressive disorder, recurrent, moderate (HCC) 03/06/2019   Malignant neoplasm of left female breast (HCC) 03/06/2019   Memory impairment 08/15/2022   Myocardial infarction (HCC) 09/21/2022   Osteopenia 03/06/2019   Other insomnia 08/28/2019   Type 2 diabetes mellitus (HCC) 02/04/2022   Vitamin D insufficiency 03/06/2019   Chronic pain syndrome 09/21/2022   Pharmacologic therapy 09/21/2022   Disorder of skeletal system 09/21/2022   Problems influencing health status 09/21/2022   Resolved Ambulatory Problems    Diagnosis Date Noted   No Resolved Ambulatory Problems   Past Medical History:  Diagnosis Date   Anemia    Anginal pain (HCC)    Breast cancer (HCC) 2008   Cancer (HCC)    COPD (chronic obstructive pulmonary disease) (HCC)    Coronary artery disease    History of left heart catheterization    Hx of migraine headaches    Personal history of chemotherapy    Wears dentures    Constitutional Exam  General appearance: Well nourished, well developed, and well hydrated. In no apparent acute distress There were no vitals filed for this visit. BMI Assessment: Estimated body mass index is 19.27 kg/m as calculated from the following:   Height as of 12/04/20: 5\' 1"  (1.549 m).   Weight as of 01/31/21: 102 lb (46.3 kg).  BMI interpretation table: BMI level Category Range association with higher incidence of chronic pain  <18 kg/m2 Underweight   18.5-24.9 kg/m2 Ideal body weight   25-29.9 kg/m2 Overweight Increased incidence by 20%  30-34.9 kg/m2 Obese (Class I) Increased incidence by 68%  35-39.9 kg/m2 Severe obesity (Class II) Increased incidence by 136%  >40 kg/m2 Extreme obesity (Class III) Increased incidence by 254%   Patient's current BMI Ideal Body weight  There is no height or weight on file to calculate BMI. Patient weight not  recorded   BMI Readings from Last 4 Encounters:  01/31/21 19.27 kg/m  12/04/20 19.40 kg/m  10/02/20 20.03 kg/m  01/13/20 22.67 kg/m   Wt Readings from Last 4 Encounters:  01/31/21 102 lb (46.3 kg)  12/04/20 102 lb 11.2 oz (46.6 kg)  10/02/20 106 lb (48.1 kg)  01/13/20 120 lb (54.4 kg)    Psych/Mental status: Alert, oriented x 3 (person, place, & time)       Eyes: PERLA Respiratory: No evidence of acute respiratory distress  Assessment  Primary Diagnosis & Pertinent Problem List: The primary encounter diagnosis was Chronic pain syndrome. Diagnoses of Pharmacologic therapy, Disorder of skeletal system, and Problems influencing health status were also pertinent to this visit.  Visit Diagnosis (New problems to examiner): 1. Chronic pain syndrome   2. Pharmacologic therapy   3. Disorder of skeletal system   4. Problems influencing health  status    Plan of Care (Initial workup plan)  Note: Ms. Wingard was reminded that as per protocol, today's visit has been an evaluation only. We have not taken over the patient's controlled substance management.  Problem-specific plan: No problem-specific Assessment & Plan notes found for this encounter.  Lab Orders  No laboratory test(s) ordered today   Imaging Orders  No imaging studies ordered today   Referral Orders  No referral(s) requested today   Procedure Orders    No procedure(s) ordered today   Pharmacotherapy (current): Medications ordered:  No orders of the defined types were placed in this encounter.  Medications administered during this visit: Tonya Keith had no medications administered during this visit.   Analgesic Pharmacotherapy:  Opioid Analgesics: For patients currently taking or requesting to take opioid analgesics, in accordance with [facility] Guidelines, we will assess their risks and indications for the use of these substances. After completing our evaluation, we may offer recommendations,  but we no longer take patients for medication management. The prescribing physician will ultimately decide, based on his/her training and level of comfort whether to adopt any of the recommendations, including whether or not to prescribe such medicines.  Membrane stabilizer: To be determined at a later time  Muscle relaxant: To be determined at a later time  NSAID: To be determined at a later time  Other analgesic(s): To be determined at a later time   Interventional management options: Ms. Shands was informed that there is no guarantee that she would be a candidate for interventional therapies. The decision will be based on the results of diagnostic studies, as well as Ms. Mckeough risk profile.  Procedure(s) under consideration:  Pending results of ordered studies      Interventional Therapies  Risk Factors  Considerations:     Planned  Pending:      Under consideration:   Pending   Completed:   None at this time   Therapeutic  Palliative (PRN) options:   None established   Completed by other providers:   None reported       Provider-requested follow-up: No follow-ups on file.  Future Appointments  Date Time Provider Department Center  09/22/2022  2:00 PM Delano Metz, MD [facility] None    Duration of encounter: *** minutes.  Total time on encounter, as per AMA guidelines included both the face-to-face and non-face-to-face time personally spent by the physician and/or other qualified health care professional(s) on the day of the encounter (includes time in activities that require the physician or other qualified health care professional and does not include time in activities normally performed by clinical staff). Physician's time may include the following activities when performed: Preparing to see the patient (e.g., pre-charting review of records, searching for previously ordered imaging, lab work, and nerve conduction tests) Review of prior analgesic  pharmacotherapies. Reviewing PMP Interpreting ordered tests (e.g., lab work, imaging, nerve conduction tests) Performing post-procedure evaluations, including interpretation of diagnostic procedures Obtaining and/or reviewing separately obtained history Performing a medically appropriate examination and/or evaluation Counseling and educating the patient/family/caregiver Ordering medications, tests, or procedures Referring and communicating with other health care professionals (when not separately reported) Documenting clinical information in the electronic or other health record Independently interpreting results (not separately reported) and communicating results to the patient/ family/caregiver Care coordination (not separately reported)  Note by: Oswaldo Done, MD (TTS technology used. I apologize for any typographical errors that were not detected and corrected.) Date: 09/22/2022; Time: 11:49  AM

## 2022-09-22 ENCOUNTER — Encounter: Payer: Self-pay | Admitting: Pain Medicine

## 2022-09-22 ENCOUNTER — Ambulatory Visit: Payer: 59 | Attending: Pain Medicine | Admitting: Pain Medicine

## 2022-09-22 VITALS — BP 126/79 | HR 96 | Temp 97.2°F | Ht 61.0 in | Wt 102.0 lb

## 2022-09-22 DIAGNOSIS — M25651 Stiffness of right hip, not elsewhere classified: Secondary | ICD-10-CM

## 2022-09-22 DIAGNOSIS — M79604 Pain in right leg: Secondary | ICD-10-CM

## 2022-09-22 DIAGNOSIS — M79605 Pain in left leg: Secondary | ICD-10-CM | POA: Diagnosis not present

## 2022-09-22 DIAGNOSIS — M25552 Pain in left hip: Secondary | ICD-10-CM

## 2022-09-22 DIAGNOSIS — G894 Chronic pain syndrome: Secondary | ICD-10-CM | POA: Diagnosis not present

## 2022-09-22 DIAGNOSIS — M25551 Pain in right hip: Secondary | ICD-10-CM | POA: Diagnosis not present

## 2022-09-22 DIAGNOSIS — Z79899 Other long term (current) drug therapy: Secondary | ICD-10-CM

## 2022-09-22 DIAGNOSIS — G8929 Other chronic pain: Secondary | ICD-10-CM | POA: Diagnosis present

## 2022-09-22 DIAGNOSIS — M5386 Other specified dorsopathies, lumbar region: Secondary | ICD-10-CM

## 2022-09-22 DIAGNOSIS — M159 Polyosteoarthritis, unspecified: Secondary | ICD-10-CM | POA: Diagnosis present

## 2022-09-22 DIAGNOSIS — M899 Disorder of bone, unspecified: Secondary | ICD-10-CM

## 2022-09-22 DIAGNOSIS — M25652 Stiffness of left hip, not elsewhere classified: Secondary | ICD-10-CM | POA: Diagnosis present

## 2022-09-22 DIAGNOSIS — M545 Low back pain, unspecified: Secondary | ICD-10-CM | POA: Diagnosis present

## 2022-09-22 DIAGNOSIS — Z789 Other specified health status: Secondary | ICD-10-CM | POA: Diagnosis present

## 2022-09-22 DIAGNOSIS — F1291 Cannabis use, unspecified, in remission: Secondary | ICD-10-CM

## 2022-09-22 NOTE — Patient Instructions (Signed)
____________________________________________________________________________________________  New Patients  Welcome to [facility] [facility].   Initial Visit The first or initial visit consists of an evaluation only.   Interventional pain management.  We offer therapies other than opioid controlled substances to manage chronic pain. These include, but are not limited to, diagnostic, therapeutic, and palliative specialized injection therapies (i.e.: Epidural Steroids, Facet Blocks, etc.). We specialize in a variety of nerve blocks as well as radiofrequency treatments. We offer pain implant evaluations and trials, as well as follow up management. In addition we also provide a variety joint injections, including Viscosupplementation (AKA: Gel Therapy).  Prescription Pain Medication. We specialize in alternatives to opioids. We can provide evaluations and recommendations for/of pharmacologic therapies based on CDC Guidelines.  We no longer take patients for long-term medication management. We will not be taking over your pain medications.  ____________________________________________________________________________________________    ____________________________________________________________________________________________  Patient Information update  To: All of our patients.  Re: Name change.  It has been made official that our current name, "[facility]"   will soon be changed to "[facility]".   The purpose of this change is to eliminate any confusion created by the concept of our practice being a "Medication Management Pain Clinic". In the past this has led to the misconception that we treat pain primarily by the use of prescription medications.  Nothing can be farther from the truth.   Understanding PAIN MANAGEMENT: To  further understand what our practice does, you first have to understand that "Pain Management" is a subspecialty that requires additional training once a physician has completed their specialty training, which can be in either Anesthesia, Neurology, Psychiatry, or Physical Medicine and Rehabilitation (PMR). Each one of these contributes to the final approach taken by each physician to the management of their patient's pain. To be a "Pain Management Specialist" you must have first completed one of the specialty trainings below.  Anesthesiologists - trained in clinical pharmacology and interventional techniques such as nerve blockade and regional as well as central neuroanatomy. They are trained to block pain before, during, and after surgical interventions.  Neurologists - trained in the diagnosis and pharmacological treatment of complex neurological conditions, such as Multiple Sclerosis, Parkinson's, spinal cord injuries, and other systemic conditions that may be associated with symptoms that may include but are not limited to pain. They tend to rely primarily on the treatment of chronic pain using prescription medications.  Psychiatrist - trained in conditions affecting the psychosocial wellbeing of patients including but not limited to depression, anxiety, schizophrenia, personality disorders, addiction, and other substance use disorders that may be associated with chronic pain. They tend to rely primarily on the treatment of chronic pain using prescription medications.   Physical Medicine and Rehabilitation (PMR) physicians, also known as physiatrists - trained to treat a wide variety of medical conditions affecting the brain, spinal cord, nerves, bones, joints, ligaments, muscles, and tendons. Their training is primarily aimed at treating patients that have suffered injuries that have caused severe physical impairment. Their training is primarily aimed at the physical therapy and rehabilitation of those  patients. They may also work alongside orthopedic surgeons or neurosurgeons using their expertise in assisting surgical patients to recover after their surgeries.  INTERVENTIONAL PAIN MANAGEMENT is sub-subspecialty of Pain Management.  Our physicians are Board-certified in Anesthesia, Pain Management, and Interventional Pain Management.  This meaning that not only have they been trained   and Board-certified in their specialty of Anesthesia, and subspecialty of Pain Management, but they have also received further training in the sub-subspecialty of Interventional Pain Management, in order to become Board-certified as INTERVENTIONAL PAIN MANAGEMENT SPECIALIST.    Mission: Our goal is to use our skills in  INTERVENTIONAL PAIN MANAGEMENT as alternatives to the chronic use of prescription opioid medications for the treatment of pain. To make this more clear, we have changed our name to reflect what we do and offer. We will continue to offer medication management assessment and recommendations, but we will not be taking over any patient's medication management.  ____________________________________________________________________________________________     

## 2022-09-22 NOTE — Progress Notes (Signed)
Nursing Pain Medication Assessment:  Safety precautions to be maintained throughout the outpatient stay will include: orient to surroundings, keep bed in low position, maintain call bell within reach at all times, provide assistance with transfer out of bed and ambulation.  

## 2022-09-24 ENCOUNTER — Ambulatory Visit
Admission: RE | Admit: 2022-09-24 | Discharge: 2022-09-24 | Disposition: A | Discharge status: Home / Self Care | Payer: 59 | Attending: Pain Medicine | Admitting: Pain Medicine

## 2022-09-24 ENCOUNTER — Other Ambulatory Visit: Payer: Self-pay | Admitting: Pain Medicine

## 2022-09-24 ENCOUNTER — Ambulatory Visit
Admission: RE | Admit: 2022-09-24 | Discharge: 2022-09-24 | Disposition: A | Discharge status: Home / Self Care | Payer: 59 | Source: Ambulatory Visit | Attending: Pain Medicine | Admitting: Pain Medicine

## 2022-09-24 DIAGNOSIS — M79604 Pain in right leg: Secondary | ICD-10-CM | POA: Insufficient documentation

## 2022-09-24 DIAGNOSIS — M25552 Pain in left hip: Secondary | ICD-10-CM | POA: Insufficient documentation

## 2022-09-24 DIAGNOSIS — M5386 Other specified dorsopathies, lumbar region: Secondary | ICD-10-CM

## 2022-09-24 DIAGNOSIS — M545 Low back pain, unspecified: Secondary | ICD-10-CM

## 2022-09-24 DIAGNOSIS — M899 Disorder of bone, unspecified: Secondary | ICD-10-CM

## 2022-09-24 DIAGNOSIS — M25651 Stiffness of right hip, not elsewhere classified: Secondary | ICD-10-CM | POA: Insufficient documentation

## 2022-09-24 DIAGNOSIS — G8929 Other chronic pain: Secondary | ICD-10-CM

## 2022-09-24 DIAGNOSIS — M25551 Pain in right hip: Secondary | ICD-10-CM | POA: Insufficient documentation

## 2022-09-24 DIAGNOSIS — M159 Polyosteoarthritis, unspecified: Secondary | ICD-10-CM | POA: Diagnosis present

## 2022-09-24 DIAGNOSIS — M79605 Pain in left leg: Secondary | ICD-10-CM | POA: Diagnosis present

## 2022-09-24 DIAGNOSIS — Z79899 Other long term (current) drug therapy: Secondary | ICD-10-CM

## 2022-09-24 DIAGNOSIS — F1291 Cannabis use, unspecified, in remission: Secondary | ICD-10-CM

## 2022-09-24 DIAGNOSIS — M25652 Stiffness of left hip, not elsewhere classified: Secondary | ICD-10-CM

## 2022-09-24 DIAGNOSIS — Z789 Other specified health status: Secondary | ICD-10-CM

## 2022-09-24 DIAGNOSIS — G894 Chronic pain syndrome: Secondary | ICD-10-CM

## 2022-09-25 ENCOUNTER — Other Ambulatory Visit: Payer: Self-pay | Admitting: Family Medicine

## 2022-09-25 ENCOUNTER — Encounter: Payer: Self-pay | Admitting: Pain Medicine

## 2022-09-25 DIAGNOSIS — R892 Abnormal level of other drugs, medicaments and biological substances in specimens from other organs, systems and tissues: Secondary | ICD-10-CM | POA: Insufficient documentation

## 2022-09-25 DIAGNOSIS — Z1231 Encounter for screening mammogram for malignant neoplasm of breast: Secondary | ICD-10-CM

## 2022-09-25 LAB — COMPLIANCE DRUG ANALYSIS, UR

## 2022-09-28 LAB — COMP. METABOLIC PANEL (12)
AST: 19 IU/L (ref 0–40)
Albumin/Globulin Ratio: 1.8 (ref 1.2–2.2)
Albumin: 4.4 g/dL (ref 3.9–4.9)
Alkaline Phosphatase: 111 IU/L (ref 44–121)
BUN/Creatinine Ratio: 21 (ref 12–28)
BUN: 16 mg/dL (ref 8–27)
Bilirubin Total: 0.5 mg/dL (ref 0.0–1.2)
Calcium: 9.3 mg/dL (ref 8.7–10.3)
Chloride: 105 mmol/L (ref 96–106)
Creatinine, Ser: 0.77 mg/dL (ref 0.57–1.00)
Globulin, Total: 2.5 g/dL (ref 1.5–4.5)
Glucose: 86 mg/dL (ref 70–99)
Potassium: 4.5 mmol/L (ref 3.5–5.2)
Sodium: 140 mmol/L (ref 134–144)
Total Protein: 6.9 g/dL (ref 6.0–8.5)
eGFR: 86 mL/min/{1.73_m2} (ref >59)

## 2022-09-28 LAB — 25-HYDROXY VITAMIN D LCMS D2+D3
25-Hydroxy, Vitamin D-2: 14 ng/mL
25-Hydroxy, Vitamin D-3: 8.3 ng/mL
25-Hydroxy, Vitamin D: 22 ng/mL — ABNORMAL LOW

## 2022-09-28 LAB — C-REACTIVE PROTEIN: CRP: <1 mg/L (ref 0–10)

## 2022-09-28 LAB — MAGNESIUM: Magnesium: 1.9 mg/dL (ref 1.6–2.3)

## 2022-09-28 LAB — VITAMIN B12: Vitamin B-12: 636 pg/mL (ref 232–1245)

## 2022-09-28 LAB — SEDIMENTATION RATE: Sed Rate: 19 mm/hr (ref 0–40)

## 2022-09-30 LAB — CANNABIDIOL (CBD)/TETRAHYDROCANNABINOL (THC) RATIO, URINE: CBD:THC Ratio: 0.1 RATIO

## 2022-09-30 LAB — DELTA-8 / DELTA-9 THC MTB,MS,U
Carboxy-Delta-8-THC: 453.9 ng/mL
Carboxy-Delta-9-THC: >500 ng/mL

## 2022-10-02 ENCOUNTER — Ambulatory Visit
Admission: RE | Admit: 2022-10-02 | Discharge: 2022-10-02 | Disposition: A | Discharge status: Home / Self Care | Payer: 59 | Source: Ambulatory Visit | Attending: Family Medicine | Admitting: Family Medicine

## 2022-10-02 DIAGNOSIS — Z1231 Encounter for screening mammogram for malignant neoplasm of breast: Secondary | ICD-10-CM | POA: Diagnosis present

## 2022-10-22 ENCOUNTER — Ambulatory Visit: Payer: 59 | Admitting: Pain Medicine

## 2022-11-02 NOTE — Progress Notes (Unsigned)
PROVIDER NOTE: Information contained herein reflects review and annotations entered in association with encounter. Interpretation of such information and data should be left to medically-trained personnel. Information provided to patient can be located elsewhere in the medical record under "Patient Instructions". Document created using STT-dictation technology, any transcriptional errors that may result from process are unintentional.    Patient: Tonya Keith  Service Category: E/M  Provider: Oswaldo Done, MD  DOB: [birth year]  DOS: 11/03/2022  Referring Provider: Rayetta Humphrey, MD  MRN: [id number]  Specialty: Interventional Pain Management  PCP: Rayetta Humphrey, MD  Type: Established Patient  Setting: Ambulatory outpatient    Location: Office  Delivery: Face-to-face     Primary Reason(s) for Visit: Encounter for evaluation before starting new chronic pain management plan of care (Level of risk: moderate) CC: No chief complaint on file.  HPI  Tonya Keith is a 62 y.o. year old, female patient, who comes today for a follow-up evaluation to review the test results and decide on a treatment plan. She has Chronic pain of lower extremity (1ry area of Pain) (Bilateral); Bilateral lower extremity edema; Moderate tobacco use disorder; Hypertension; Hypercholesterolemia; Hypothermia; Chronic obstructive pulmonary disease (HCC); Coronary artery disease involving native coronary artery of native heart without angina pectoris; Heart murmur; Hemoglobin C trait (HCC); Major depressive disorder, recurrent, moderate (HCC); Malignant neoplasm of left female breast (HCC); Memory impairment; Myocardial infarction (HCC); Osteopenia; Other insomnia; Type 2 diabetes mellitus (HCC); Vitamin D insufficiency; Chronic pain syndrome; Pharmacologic therapy; Disorder of skeletal system; Problems influencing health status; Chronic hip pain (Bilateral) (R>L); Decreased range of motion of hips (Bilateral) (R>L); Chronic low back pain  (Bilateral) (R>L) w/o sciatica; Decreased range of motion of lumbar spine; Generalized osteoarthritis of multiple sites; History of marijuana use; and Abnormal drug screen (09/22/2022) on their problem list. Her primarily concern today is the No chief complaint on file.  Pain Assessment: Location:     Radiating:   Onset:   Duration:   Quality:   Severity:  /10 (subjective, self-reported pain score)  Effect on ADL:   Timing:   Modifying factors:   BP:    HR:    Tonya Keith comes in today for a follow-up visit after her initial evaluation on 09/22/2022. Today we went over the results of her tests. These were explained in "Layman's terms". During today's appointment we went over my diagnostic impression, as well as the proposed treatment plan.  ***:"According to the patient she has been experiencing lower extremity pain for the past 8 years.  She has a history significant for breast cancer in 2008 followed by an MI and a CABG in 2010.  She is currently on ASA.  She has non-insulin-dependent diabetes mellitus and she is a smoker.  She also consumes marijuana for the pain.  She does say COPD.   The patient's primary area of pain is that of the lower extremities (Bilateral) (R>L).  She denies any prior surgeries and she indicates having had some physical therapy for the lower back and leg pain which did not help.   The patient's secondary area pain is that of the lower back (Bilateral) (R>L).  Physical therapy did not help this pain.  She has decreased range of motion and bilateral lower extremity pain primarily in the area of the hips.   Physical exam today: The patient was able to heel walk and toe walk without any problems.  Straight leg raise was negative bilaterally but decreased range of motion on the  right side.  Provocative Patrick maneuver was positive bilaterally for hip joint arthralgia with the right side being worse than the left.  The patient also demonstrated decreased range of motion of  the right hip compared to the left although both of them had decreased range motion.  Lumbar spine exam showed decreased range of motion of the lumbar spine with a positive provocative hyperextension or rotation maneuver/Kemp maneuver bilaterally with pain being referred towards the side that she was turning into suggestive of bilateral, ipsilateral, facet joint arthralgia.   Note: The patient indicated being afraid of interventional therapies."  ***  Patient presented with interventional treatment options. Tonya Keith was informed that I will not be providing medication management. Pharmacotherapy evaluation including recommendations may be offered, if specifically requested.   Controlled Substance Pharmacotherapy Assessment REMS (Risk Evaluation and Mitigation Strategy)  Opioid Analgesic: Hydrocodone/APAP 5/325, 1 tab p.o. 5 times a day (# 15) (last filled on 09/10/2022) MME/day: 25 mg/day  Pill Count: None expected due to no prior prescriptions written by our practice. No notes on file Pharmacokinetics: Liberation and absorption (onset of action): WNL Distribution (time to peak effect): WNL Metabolism and excretion (duration of action): WNL         Pharmacodynamics: Desired effects: Analgesia: Tonya Keith reports >50% benefit. Functional ability: Patient reports that medication allows her to accomplish basic ADLs Clinically meaningful improvement in function (CMIF): Sustained CMIF goals met Perceived effectiveness: Described as relatively effective, allowing for increase in activities of daily living (ADL) Undesirable effects: Side-effects or Adverse reactions: None reported Monitoring: [place] PMP: PDMP reviewed during this encounter. Online review of the past 44-month period previously conducted. Not applicable at this point since we have not taken over the patient's medication management yet. List of other Serum/Urine Drug Screening Test(s):  Lab Results  Component Value Date   COCAINSCRNUR  NONE DETECTED 12/03/2017   THCU POSITIVE (A) 12/03/2017   CBDTHCR 0.1 09/22/2022   D8THCCBX 453.9 09/22/2022   D9THCCBX >500.0 09/22/2022   List of all UDS test(s) done:  Lab Results  Component Value Date   SUMMARY Note 09/22/2022   Last UDS on record: Summary  Date Value Ref Range Status  09/22/2022 Note  Final    Comment:    ==================================================================== Compliance Drug Analysis, Ur ==================================================================== Test                             Result       Flag       Units  Drug Present not Declared for Prescription Verification   Carboxy-THC                    >518         UNEXPECTED ng/mg creat    Carboxy-THC is a metabolite of tetrahydrocannabinol (THC). Source of    THC is most commonly herbal marijuana or marijuana-based products,    but THC is also present in a scheduled prescription medication.    Trace amounts of THC can be present in hemp and cannabidiol (CBD)    products. This test is not intended to distinguish between delta-9-    tetrahydrocannabinol, the predominant form of THC in most herbal or    marijuana-based products, and delta-8-tetrahydrocannabinol.    Citalopram                     PRESENT      UNEXPECTED   Desmethylcitalopram  PRESENT      UNEXPECTED    Desmethylcitalopram is an expected metabolite of citalopram or the    enantiomeric form, escitalopram.    Naproxen                       PRESENT      UNEXPECTED  Drug Absent but Declared for Prescription Verification   Salicylate                     Not Detected UNEXPECTED    Aspirin, as indicated in the declared medication list, is not always    detected even when used as directed.    Ibuprofen                      Not Detected UNEXPECTED    Ibuprofen, as indicated in the declared medication list, is not    always detected even when used as directed.    Clonidine                      Not Detected  UNEXPECTED   Metoprolol                     Not Detected UNEXPECTED ==================================================================== Test                      Result    Flag   Units      Ref Range   Creatinine              193              mg/dL      >=13 ==================================================================== Declared Medications:  The flagging and interpretation on this report are based on the  following declared medications.  Unexpected results may arise from  inaccuracies in the declared medications.   **Note: The testing scope of this panel includes these medications:   Clonidine (Catapres)  Metoprolol (Toprol)   **Note: The testing scope of this panel does not include small to  moderate amounts of these reported medications:   Aspirin  Ibuprofen (Advil)   **Note: The testing scope of this panel does not include the  following reported medications:   Albuterol  Amlodipine (Norvasc)  Azelastine (Astelin)  Cetirizine (Zyrtec)  Ipratropium (Atrovent)  Iron  Isosorbide (Imdur)  Lovastatin (Mevacor)  Nitroglycerin (Nitrostat)  Tiotropium  Vitamin D2 (Drisdol) ==================================================================== For clinical consultation, please call [phone]. ====================================================================    UDS interpretation: No unexpected findings.          Medication Assessment Form: Not applicable. No opioids. Treatment compliance: Not applicable Risk Assessment Profile: Aberrant behavior: See initial evaluations. None observed or detected today Comorbid factors increasing risk of overdose: See initial evaluation. No additional risks detected today Opioid risk tool (ORT):      No data to display          ORT Scoring interpretation table:  Score <3 = Low Risk for SUD  Score between 4-7 = Moderate Risk for SUD  Score >8 = High Risk for Opioid Abuse   Risk of substance use disorder (SUD):  Low  Risk Mitigation Strategies:  Patient opioid safety counseling: No controlled substances prescribed. Patient-Prescriber Agreement (PPA): No agreement signed.  Controlled substance notification to other providers: None required. No opioid therapy.  Pharmacologic Plan: Non-opioid analgesic therapy offered. Interventional alternatives discussed.  Laboratory Chemistry Profile   Renal Lab Results  Component Value Date   BUN 16 09/22/2022   CREATININE 0.77 09/22/2022   BCR 21 09/22/2022   GFRAA >60 01/13/2020   GFRNONAA >60 01/13/2020   PROTEINUR 30 (A) 01/04/2019     Electrolytes Lab Results  Component Value Date   NA 140 09/22/2022   K 4.5 09/22/2022   CL 105 09/22/2022   CALCIUM 9.3 09/22/2022   MG 1.9 09/22/2022     Hepatic Lab Results  Component Value Date   AST 19 09/22/2022   ALT 16 01/13/2020   ALBUMIN 4.4 09/22/2022   ALKPHOS 111 09/22/2022     ID Lab Results  Component Value Date   HIV Non Reactive 12/04/2017   SARSCOV2NAA NEGATIVE 09/02/2019     Bone Lab Results  Component Value Date   25OHVITD1 22 (L) 09/22/2022   25OHVITD2 14 09/22/2022   25OHVITD3 8.3 09/22/2022     Endocrine Lab Results  Component Value Date   GLUCOSE 86 09/22/2022   GLUCOSEU NEGATIVE 01/04/2019   TSH 0.950 12/03/2017   CRTSLPL 5.7 12/04/2017     Neuropathy Lab Results  Component Value Date   VITAMINB12 636 09/22/2022   HIV Non Reactive 12/04/2017     CNS No results found for: "COLORCSF", "APPEARCSF", "RBCCOUNTCSF", "WBCCSF", "POLYSCSF", "LYMPHSCSF", "EOSCSF", "PROTEINCSF", "GLUCCSF", "JCVIRUS", "CSFOLI", "IGGCSF", "LABACHR", "ACETBL"   Inflammation (CRP: Acute  ESR: Chronic) Lab Results  Component Value Date   CRP <1 09/22/2022   ESRSEDRATE 19 09/22/2022   LATICACIDVEN 1.7 12/03/2017     Rheumatology No results found for: "RF", "ANA", "LABURIC", "URICUR", "LYMEIGGIGMAB", "LYMEABIGMQN", "HLAB27"   Coagulation Lab Results  Component Value Date    INR 0.9 01/13/2020   LABPROT 11.4 01/13/2020   APTT 28 01/13/2020   PLT 238 01/13/2020     Cardiovascular Lab Results  Component Value Date   BNP 46.0 08/23/2019   TROPONINI <0.03 12/03/2017   HGB 14.3 01/13/2020   HCT 40.1 01/13/2020     Screening Lab Results  Component Value Date   SARSCOV2NAA NEGATIVE 09/02/2019   HIV Non Reactive 12/04/2017     Cancer Lab Results  Component Value Date   LABCA2 21.2 05/04/2014     Allergens No results found for: "ALMOND", "APPLE", "ASPARAGUS", "AVOCADO", "BANANA", "BARLEY", "BASIL", "BAYLEAF", "GREENBEAN", "LIMABEAN", "WHITEBEAN", "BEEFIGE", "REDBEET", "BLUEBERRY", "BROCCOLI", "CABBAGE", "MELON", "CARROT", "CASEIN", "CASHEWNUT", "CAULIFLOWER", "CELERY"     Note: Lab results reviewed.  Recent Diagnostic Imaging Review  Cervical Imaging: Cervical MR wo contrast: Results for orders placed during the hospital encounter of 04/07/22 MR CERVICAL SPINE WO CONTRAST  Narrative CLINICAL DATA:  Chronic headaches  EXAM: MRI CERVICAL SPINE WITHOUT CONTRAST  TECHNIQUE: Multiplanar, multisequence MR imaging of the cervical spine was performed. No intravenous contrast was administered.  COMPARISON:  08/04/2007  FINDINGS: Despite efforts by the technologist and patient, motion artifact is present on today's exam and could not be eliminated. This reduces exam sensitivity and specificity.  Alignment: No vertebral subluxation is observed.  Vertebrae: Mild disc desiccation throughout the cervical spine. No significant vertebral marrow edema is identified.  Cord: No significant abnormal spinal cord signal is observed.  Posterior Fossa, vertebral arteries, paraspinal tissues: The cerebellar tonsils extend about 5 mm below the foramen magnum, as discussed on prior brain MRI.  Disc levels:  C2-3: No impingement. The intervertebral disc at this level is somewhat small although this may be congenital.  C3-4: No impingement. Mild  degenerative facet arthropathy on the right.  C4-5: Unremarkable.  C5-6: No impingement.  Small right paracentral disc protrusion  C6-7: Unremarkable  C7-T1: Unremarkable  IMPRESSION: 1. No impingement in the cervical spine. 2. Mild degenerative facet arthropathy on the right at C3-4. 3. Small right paracentral disc protrusion at C5-6, without impingement. 4. The cerebellar tonsils extend about 5 mm below the foramen magnum, as discussed on prior brain MRI.   Electronically Signed By: Gaylyn Rong M.D. On: 04/08/2022 16:15  Lumbosacral Imaging: Lumbar DG Bending views: Results for orders placed during the hospital encounter of 09/24/22 DG Lumbar Spine Complete W/Bend  Narrative CLINICAL DATA:  Low back pain  EXAM: LUMBAR SPINE - COMPLETE WITH BENDING VIEWS  COMPARISON:  None Available.  FINDINGS: Slight dextrocurvature of the lower thoracic and lumbar spine noted on the frontal view. No pars defects. Preserved vertebral body heights and disc spaces. No instability with limited flexion and extension. Very minimal endplate bony spurring at L2 through L4. Normal pedicles and SI joints for age. Included chest and abdomen unremarkable. Tubal ligation clips present.  IMPRESSION: Minor degenerative changes as above. No acute finding by plain radiography.   Electronically Signed By: Judie Petit.  Shick M.D. On: 09/28/2022 20:01  Knee Imaging: Knee-R DG 4 views: Results for orders placed during the hospital encounter of 10/13/19 DG Knee Complete 4 Views Right  Narrative CLINICAL DATA:  Chronic pain  EXAM: RIGHT KNEE - COMPLETE 4+ VIEW  COMPARISON:  October 31, 2016  FINDINGS: Frontal, lateral, tunnel, and sunrise patellar images were obtained. There is no fracture, dislocation, or joint effusion. The joint spaces appear unremarkable. No erosive change.  IMPRESSION: No fracture, dislocation, or joint effusion. No evident arthropathy.   Electronically  Signed By: Bretta Bang III M.D. On: 10/14/2019 08:29  Knee-L DG 4 views: Results for orders placed during the hospital encounter of 10/13/19 DG Knee Complete 4 Views Left  Narrative CLINICAL DATA:  Chronic pain  EXAM: LEFT KNEE - COMPLETE 4+ VIEW  COMPARISON:  None.  FINDINGS: Frontal, lateral, tunnel, and sunrise patellar images obtained. No fracture, dislocation, or joint effusion. Joint spaces appear unremarkable. No erosive change.  IMPRESSION: No fracture, dislocation, or joint effusion. No evident arthropathy.   Electronically Signed By: Bretta Bang III M.D. On: 10/14/2019 08:30  Complexity Note: Imaging results reviewed.                         Meds   Current Outpatient Medications:    albuterol (VENTOLIN HFA) 108 (90 Base) MCG/ACT inhaler, Inhale 2 puffs into the lungs every 6 (six) hours as needed for wheezing or shortness of breath., Disp: , Rfl:    amLODipine (NORVASC) 10 MG tablet, Take 10 mg by mouth daily. , Disp: , Rfl:    aspirin EC 81 MG tablet, Take 81 mg by mouth daily. , Disp: , Rfl:    azelastine (ASTELIN) 0.1 % nasal spray, Place 2 sprays into both nostrils 2 (two) times daily. Use in each nostril as directed, Disp: , Rfl:    cetirizine (ZYRTEC) 10 MG tablet, Take 10 mg by mouth daily. , Disp: , Rfl:    cloNIDine (CATAPRES) 0.2 MG tablet, Take 0.2 mg by mouth 2 (two) times daily. , Disp: , Rfl:    ferrous sulfate 324 MG TBEC, Take 324 mg by mouth., Disp: , Rfl:    ibuprofen (ADVIL) 200 MG tablet, Take 200-400 mg by mouth every 6 (six) hours as needed for headache or moderate pain., Disp: , Rfl:  ipratropium (ATROVENT) 0.03 % nasal spray, Place 2 sprays into both nostrils 2 (two) times daily., Disp: , Rfl:    isosorbide mononitrate (IMDUR) 30 MG 24 hr tablet, Take 30 mg by mouth daily., Disp: , Rfl:    lovastatin (MEVACOR) 40 MG tablet, Take 40 mg by mouth daily. , Disp: , Rfl:    metoprolol succinate (TOPROL-XL) 100 MG 24 hr tablet,  Take 50 mg by mouth daily., Disp: , Rfl:    nitroGLYCERIN (NITROSTAT) 0.4 MG SL tablet, Place 0.4 mg under the tongue every 5 (five) minutes as needed for chest pain., Disp: , Rfl:    Tiotropium Bromide Monohydrate 2.5 MCG/ACT AERS, Inhale into the lungs., Disp: , Rfl:    Vitamin D, Ergocalciferol, (DRISDOL) 1.25 MG (50000 UNIT) CAPS capsule, Take 50,000 Units by mouth every Wednesday., Disp: , Rfl:   ROS  Constitutional: Denies any fever or chills Gastrointestinal: No reported hemesis, hematochezia, vomiting, or acute GI distress Musculoskeletal: Denies any acute onset joint swelling, redness, loss of ROM, or weakness Neurological: No reported episodes of acute onset apraxia, aphasia, dysarthria, agnosia, amnesia, paralysis, loss of coordination, or loss of consciousness  Allergies  Tonya Keith is allergic to pollen extract and latex.  PFSH  Drug: Tonya Keith  reports current drug use. Drug: Marijuana. Alcohol:  reports current alcohol use. Tobacco:  reports that she has been smoking cigarettes. She has a 13.50 pack-year smoking history. She has quit using smokeless tobacco.  Her smokeless tobacco use included snuff. Medical:  has a past medical history of Anemia, Anginal pain (HCC), Breast cancer (HCC) (2008), Cancer (HCC), COPD (chronic obstructive pulmonary disease) (HCC), Coronary artery disease, History of left heart catheterization, migraine headaches, Hypertension, Myocardial infarction (HCC) (2010), Personal history of chemotherapy, and Wears dentures. Surgical: Tonya Keith  has a past surgical history that includes Mastectomy (Left, 2008); Abdominal hysterectomy; LEFT HEART CATH AND CORONARY ANGIOGRAPHY (Left, 09/07/2019); Minor excision of oral lesion (N/A, 10/02/2020); and Lesion excision with complex repair (Right, 12/04/2020). Family: family history includes Breast cancer in her maternal aunt; Cancer in her sister; Heart disease in her father; Hypertension in her father.  Constitutional Exam   General appearance: Well nourished, well developed, and well hydrated. In no apparent acute distress There were no vitals filed for this visit. BMI Assessment: Estimated body mass index is 19.27 kg/m as calculated from the following:   Height as of 09/22/22: 5\' 1"  (1.549 m).   Weight as of 09/22/22: 102 lb (46.3 kg).  BMI interpretation table: BMI level Category Range association with higher incidence of chronic pain  <18 kg/m2 Underweight   18.5-24.9 kg/m2 Ideal body weight   25-29.9 kg/m2 Overweight Increased incidence by 20%  30-34.9 kg/m2 Obese (Class I) Increased incidence by 68%  35-39.9 kg/m2 Severe obesity (Class II) Increased incidence by 136%  >40 kg/m2 Extreme obesity (Class III) Increased incidence by 254%   Patient's current BMI Ideal Body weight  There is no height or weight on file to calculate BMI. Patient weight not recorded   BMI Readings from Last 4 Encounters:  09/22/22 19.27 kg/m  01/31/21 19.27 kg/m  12/04/20 19.40 kg/m  10/02/20 20.03 kg/m   Wt Readings from Last 4 Encounters:  09/22/22 102 lb (46.3 kg)  01/31/21 102 lb (46.3 kg)  12/04/20 102 lb 11.2 oz (46.6 kg)  10/02/20 106 lb (48.1 kg)    Psych/Mental status: Alert, oriented x 3 (person, place, & time)       Eyes: PERLA Respiratory: No evidence of  acute respiratory distress  Assessment & Plan  Primary Diagnosis & Pertinent Problem List: There were no encounter diagnoses.  Visit Diagnosis: No diagnosis found. Problems updated and reviewed during this visit: No problems updated.  Plan of Care  Pharmacotherapy (Medications Ordered): No orders of the defined types were placed in this encounter.  Procedure Orders    No procedure(s) ordered today   Lab Orders  No laboratory test(s) ordered today   Imaging Orders  No imaging studies ordered today   Referral Orders  No referral(s) requested today    Pharmacological management:  Opioid Analgesics: I will not be prescribing any  opioids at this time Membrane stabilizer: I will not be prescribing any at this time Muscle relaxant: I will not be prescribing any at this time NSAID: I will not be prescribing any at this time Other analgesic(s): I will not be prescribing any at this time      Interventional Therapies  Risk Factors  Considerations:  Abnormal UDS (09/22/2022) (+) Carboxy-THC    Planned  Pending:      Under consideration:   Diagnostic bilateral IA hip joint injection #1  Diagnostic bilateral lumbar facet MBB #1    Completed:   None at this time   Therapeutic  Palliative (PRN) options:   None established   Completed by other providers:   None reported        Provider-requested follow-up: No follow-ups on file. Recent Visits Date Type Provider Dept  09/22/22 Office Visit Delano Metz, MD [facility]  Showing recent visits within past 90 days and meeting all other requirements Future Appointments Date Type Provider Dept  11/03/22 Appointment Delano Metz, MD [facility]  Showing future appointments within next 90 days and meeting all other requirements   Primary Care Physician: Rayetta Humphrey, MD  Duration of encounter: *** minutes.  Total time on encounter, as per AMA guidelines included both the face-to-face and non-face-to-face time personally spent by the physician and/or other qualified health care professional(s) on the day of the encounter (includes time in activities that require the physician or other qualified health care professional and does not include time in activities normally performed by clinical staff). Physician's time may include the following activities when performed: Preparing to see the patient (e.g., pre-charting review of records, searching for previously ordered imaging, lab work, and nerve conduction tests) Review of prior analgesic pharmacotherapies. Reviewing PMP Interpreting ordered tests (e.g., lab work, imaging, nerve  conduction tests) Performing post-procedure evaluations, including interpretation of diagnostic procedures Obtaining and/or reviewing separately obtained history Performing a medically appropriate examination and/or evaluation Counseling and educating the patient/family/caregiver Ordering medications, tests, or procedures Referring and communicating with other health care professionals (when not separately reported) Documenting clinical information in the electronic or other health record Independently interpreting results (not separately reported) and communicating results to the patient/ family/caregiver Care coordination (not separately reported)  Note by: Oswaldo Done, MD (TTS technology used. I apologize for any typographical errors that were not detected and corrected.) Date: 11/03/2022; Time: 7:23 PM

## 2022-11-03 ENCOUNTER — Ambulatory Visit: Payer: 59 | Attending: Pain Medicine | Admitting: Pain Medicine

## 2022-11-03 ENCOUNTER — Encounter: Payer: Self-pay | Admitting: Pain Medicine

## 2022-11-03 VITALS — BP 115/64 | HR 64 | Temp 97.3°F | Resp 16 | Ht 61.0 in | Wt 107.0 lb

## 2022-11-03 DIAGNOSIS — M5417 Radiculopathy, lumbosacral region: Secondary | ICD-10-CM | POA: Diagnosis present

## 2022-11-03 DIAGNOSIS — G8929 Other chronic pain: Secondary | ICD-10-CM | POA: Insufficient documentation

## 2022-11-03 DIAGNOSIS — M25652 Stiffness of left hip, not elsewhere classified: Secondary | ICD-10-CM | POA: Diagnosis present

## 2022-11-03 DIAGNOSIS — M25552 Pain in left hip: Secondary | ICD-10-CM | POA: Diagnosis present

## 2022-11-03 DIAGNOSIS — M25551 Pain in right hip: Secondary | ICD-10-CM | POA: Insufficient documentation

## 2022-11-03 DIAGNOSIS — M5416 Radiculopathy, lumbar region: Secondary | ICD-10-CM | POA: Diagnosis present

## 2022-11-03 DIAGNOSIS — M160 Bilateral primary osteoarthritis of hip: Secondary | ICD-10-CM | POA: Diagnosis present

## 2022-11-03 DIAGNOSIS — M79605 Pain in left leg: Secondary | ICD-10-CM | POA: Insufficient documentation

## 2022-11-03 DIAGNOSIS — M79604 Pain in right leg: Secondary | ICD-10-CM | POA: Insufficient documentation

## 2022-11-03 DIAGNOSIS — M25651 Stiffness of right hip, not elsewhere classified: Secondary | ICD-10-CM | POA: Diagnosis present

## 2022-11-03 DIAGNOSIS — E559 Vitamin D deficiency, unspecified: Secondary | ICD-10-CM | POA: Insufficient documentation

## 2022-11-03 DIAGNOSIS — M545 Low back pain, unspecified: Secondary | ICD-10-CM | POA: Diagnosis present

## 2022-11-03 NOTE — Patient Instructions (Addendum)
Epidural Steroid Injection  An epidural steroid injection is a shot of steroid medicine, also called cortisone, and a numbing medicine that is given into the epidural space. This space is between the spinal cord and the bones of the back. This shot helps relieve pain caused by an irritated or swollen nerve root. The pain relief you get from the injection depends on the cause of your condition and how long your pain lasts. You may have a period of slightly more pain after your injection, before the steroid medicine takes effect. This medicine usually starts working within 1-3 days. In some cases, you might need 7-10 days to feel the full effect. Tell your health care provider about: Any allergies you have. All medicines you are taking, including vitamins, herbs, eye drops, creams, and over-the-counter medicines. Any problems you or family members have had with anesthesia. Any bleeding problems you have. Any surgeries you have had. Any medical conditions you have. Whether you are pregnant or may be pregnant. What are the risks? Your health care provider will talk with you about risks. These may include: Headache. Bleeding. Infection. Allergic reaction to medicines or dyes. Nerve damage. Not being able to move (paralysis). This is rare. What happens before the procedure? Medicines You may be given medicines to lower anxiety. Ask your provider about: Changing or stopping your regular medicines. These include any diabetes medicines or blood thinners you take. Taking medicines such as aspirin and ibuprofen. These medicines can thin your blood. Do not take them unless your provider tells you to. Taking over-the-counter medicines, vitamins, herbs, and supplements. General instructions Follow instructions from your provider about what you may eat and drink. Ask your provider what steps will be taken to help prevent infection. If you will be going home right after the procedure, plan to have a  responsible adult: Take you home from the hospital or clinic. You will not be allowed to drive. Care for you for the time you are told. What happens during the procedure?  An IV will be inserted into one of your veins. You may be given a sedative to help you relax. You will be asked to sit or lie on your side. The injection site will be cleaned. An X-ray machine will be used to guide the needle close to the nerve that is causing pain. A needle will be put through your skin into the epidural space. This may cause you some discomfort. Contrast dye may be injected at the site to make sure that the steroid medicine will be sent to the exact place it needs to go. The steroid medicine and a numbing medicine will be injected into the epidural space for pain relief. The needle will be removed. A bandage (dressing) will be put over the injection site. The procedure may vary among providers and hospitals. What happens after the procedure? Your blood pressure, heart rate, breathing rate, and blood oxygen level will be monitored until you leave the hospital or clinic. Your IV will be removed. Your arm or leg may feel weak or numb for a few hours. This information is not intended to replace advice given to you by your health care provider. Make sure you discuss any questions you have with your health care provider. Document Revised: 12/20/2021 Document Reviewed: 12/20/2021 [employer] Patient Education  2024 [employer]  ______________________________________________________________________  Procedure instructions  Do not eat or drink fluids (other than water) for 6 hours before your procedure  No water for 2 hours before your procedure    Take your blood pressure medicine with a sip of water  Arrive 30 minutes before your appointment  Carefully read the "Preparing for your procedure" detailed instructions  If you have questions call us at [phone]  [phone]  _____________________________________________________________________    ______________________________________________________________________  Preparing for your procedure  Appointments: If you think you may not be able to keep your appointment, call 24-48 hours in advance to cancel. We need time to make it available to others.  During your procedure appointment there will be: No Prescription Refills. No disability issues to discussed. No medication changes or discussions.  Instructions: Food intake: Avoid eating anything solid for at least 8 hours prior to your procedure. Clear liquid intake: You may take clear liquids such as water up to 2 hours prior to your procedure. (No carbonated drinks. No soda.) Transportation: Unless otherwise stated by your physician, bring a driver. Morning Medicines: Except for blood thinners, take all of your other morning medications with a sip of water. Make sure to take your heart and blood pressure medicines. If your blood pressure's lower number is above 100, the case will be rescheduled. Blood thinners: Make sure to stop your blood thinners as instructed.  If you take a blood thinner, but were not instructed to stop it, call our office [phone] and ask to talk to a nurse. Not stopping a blood thinner prior to certain procedures could lead to serious complications. Diabetics on insulin: Notify the staff so that you can be scheduled 1st case in the morning. If your diabetes requires high dose insulin, take only  of your normal insulin dose the morning of the procedure and notify the staff that you have done so. Preventing infections: Shower with an antibacterial soap the morning of your procedure.  Build-up your immune system: Take 1000 mg of Vitamin C with every meal (3 times a day) the day prior to your procedure. Antibiotics: Inform the nursing staff if you are taking any antibiotics or if you have any conditions that may require  antibiotics prior to procedures. (Example: recent joint implants)   Pregnancy: If you are pregnant make sure to notify the nursing staff. Not doing so may result in injury to the fetus, including death.  Sickness: If you have a cold, fever, or any active infections, call and cancel or reschedule your procedure. Receiving steroids while having an infection may result in complications. Arrival: You must be in the facility at least 30 minutes prior to your scheduled procedure. Tardiness: Your scheduled time is also the cutoff time. If you do not arrive at least 15 minutes prior to your procedure, you will be rescheduled.  Children: Do not bring any children with you. Make arrangements to keep them home. Dress appropriately: There is always a possibility that your clothing may get soiled. Avoid long dresses. Valuables: Do not bring any jewelry or valuables.  Reasons to call and reschedule or cancel your procedure: (Following these recommendations will minimize the risk of a serious complication.) Surgeries: Avoid having procedures within 2 weeks of any surgery. (Avoid for 2 weeks before or after any surgery). Flu Shots: Avoid having procedures within 2 weeks of a flu shots or . (Avoid for 2 weeks before or after immunizations). Barium: Avoid having a procedure within 7-10 days after having had a radiological study involving the use of radiological contrast. (Myelograms, Barium swallow or enema study). Heart attacks: Avoid any elective procedures or surgeries for the initial 6 months after a "Myocardial Infarction" (Heart Attack). Blood thinners:   It is imperative that you stop these medications before procedures. Let us know if you if you take any blood thinner.  Infection: Avoid procedures during or within two weeks of an infection (including chest colds or gastrointestinal problems). Symptoms associated with infections include: Localized redness, fever, chills, night sweats or profuse sweating, burning  sensation when voiding, cough, congestion, stuffiness, runny nose, sore throat, diarrhea, nausea, vomiting, cold or Flu symptoms, recent or current infections. It is specially important if the infection is over the area that we intend to treat. Heart and lung problems: Symptoms that may suggest an active cardiopulmonary problem include: cough, chest pain, breathing difficulties or shortness of breath, dizziness, ankle swelling, uncontrolled high or unusually low blood pressure, and/or palpitations. If you are experiencing any of these symptoms, cancel your procedure and contact your primary care physician for an evaluation.  Remember:  Regular Business hours are:  Monday to Thursday 8:00 AM to 4:00 PM  Provider's Schedule: Francisco Naveira, MD:  Procedure days: Tuesday and Thursday 7:30 AM to 4:00 PM  Bilal Lateef, MD:  Procedure days: Monday and Wednesday 7:30 AM to 4:00 PM  ______________________________________________________________________    ____________________________________________________________________________________________  General Risks and Possible Complications  Patient Responsibilities: It is important that you read this as it is part of your informed consent. It is our duty to inform you of the risks and possible complications associated with treatments offered to you. It is your responsibility as a patient to read this and to ask questions about anything that is not clear or that you believe was not covered in this document.  Patient's Rights: You have the right to refuse treatment. You also have the right to change your mind, even after initially having agreed to have the treatment done. However, under this last option, if you wait until the last second to change your mind, you may be charged for the materials used up to that point.  Introduction: Medicine is not an exact science. Everything in Medicine, including the lack of treatment(s), carries the potential for  danger, harm, or loss (which is by definition: Risk). In Medicine, a complication is a secondary problem, condition, or disease that can aggravate an already existing one. All treatments carry the risk of possible complications. The fact that a side effects or complications occurs, does not imply that the treatment was conducted incorrectly. It must be clearly understood that these can happen even when everything is done following the highest safety standards.  No treatment: You can choose not to proceed with the proposed treatment alternative. The "PRO(s)" would include: avoiding the risk of complications associated with the therapy. The "CON(s)" would include: not getting any of the treatment benefits. These benefits fall under one of three categories: diagnostic; therapeutic; and/or palliative. Diagnostic benefits include: getting information which can ultimately lead to improvement of the disease or symptom(s). Therapeutic benefits are those associated with the successful treatment of the disease. Finally, palliative benefits are those related to the decrease of the primary symptoms, without necessarily curing the condition (example: decreasing the pain from a flare-up of a chronic condition, such as incurable terminal cancer).  General Risks and Complications: These are associated to most interventional treatments. They can occur alone, or in combination. They fall under one of the following six (6) categories: no benefit or worsening of symptoms; bleeding; infection; nerve damage; allergic reactions; and/or death. No benefits or worsening of symptoms: In Medicine there are no guarantees, only probabilities. No healthcare provider can ever guarantee that a   medical treatment will work, they can only state the probability that it may. Furthermore, there is always the possibility that the condition may worsen, either directly, or indirectly, as a consequence of the treatment. Bleeding: This is more common if  the patient is taking a blood thinner, either prescription or over the counter (example: Goody Powders, Fish oil, Aspirin, Garlic, etc.), or if suffering a condition associated with impaired coagulation (example: Hemophilia, cirrhosis of the liver, low platelet counts, etc.). However, even if you do not have one on these, it can still happen. If you have any of these conditions, or take one of these drugs, make sure to notify your treating physician. Infection: This is more common in patients with a compromised immune system, either due to disease (example: diabetes, cancer, human immunodeficiency virus [HIV], etc.), or due to medications or treatments (example: therapies used to treat cancer and rheumatological diseases). However, even if you do not have one on these, it can still happen. If you have any of these conditions, or take one of these drugs, make sure to notify your treating physician. Nerve Damage: This is more common when the treatment is an invasive one, but it can also happen with the use of medications, such as those used in the treatment of cancer. The damage can occur to small secondary nerves, or to large primary ones, such as those in the spinal cord and brain. This damage may be temporary or permanent and it may lead to impairments that can range from temporary numbness to permanent paralysis and/or brain death. Allergic Reactions: Any time a substance or material comes in contact with our body, there is the possibility of an allergic reaction. These can range from a mild skin rash (contact dermatitis) to a severe systemic reaction (anaphylactic reaction), which can result in death. Death: In general, any medical intervention can result in death, most of the time due to an unforeseen complication. ____________________________________________________________________________________________    

## 2022-11-03 NOTE — Progress Notes (Signed)
Safety precautions to be maintained throughout the outpatient stay will include: orient to surroundings, keep bed in low position, maintain call bell within reach at all times, provide assistance with transfer out of bed and ambulation.  

## 2022-11-04 ENCOUNTER — Ambulatory Visit: Payer: 59 | Attending: Orthopedic Surgery | Admitting: Occupational Therapy

## 2022-11-04 ENCOUNTER — Encounter: Payer: Self-pay | Admitting: Occupational Therapy

## 2022-11-04 DIAGNOSIS — M6281 Muscle weakness (generalized): Secondary | ICD-10-CM | POA: Diagnosis present

## 2022-11-04 DIAGNOSIS — M25531 Pain in right wrist: Secondary | ICD-10-CM | POA: Insufficient documentation

## 2022-11-04 DIAGNOSIS — M79641 Pain in right hand: Secondary | ICD-10-CM | POA: Diagnosis present

## 2022-11-04 DIAGNOSIS — G5601 Carpal tunnel syndrome, right upper limb: Secondary | ICD-10-CM | POA: Diagnosis present

## 2022-11-04 DIAGNOSIS — L905 Scar conditions and fibrosis of skin: Secondary | ICD-10-CM | POA: Insufficient documentation

## 2022-11-04 NOTE — Therapy (Signed)
[facility] [address] [place], [place], [address] Phone: [phone]   Fax:  [phone]  Occupational Therapy Evaluation  Patient Details  Name: Tonya Keith MRN: [id number] Date of Birth: July 26, 1960 Referring Provider (OT): Dr [name]   Encounter Date: 11/04/2022   OT End of Session - 11/04/22 1520     Visit Number 1    Number of Visits 12    Date for OT Re-Evaluation 12/30/22    OT Start Time 1116    OT Stop Time 1201    OT Time Calculation (min) 45 min    Activity Tolerance Patient tolerated treatment well    Behavior During Therapy [facility] for tasks assessed/performed             Past Medical History:  Diagnosis Date   Anemia    Anginal pain (HCC)    Breast cancer (HCC) 2008   chemo mastectomy left   Cancer (HCC)    breast   COPD (chronic obstructive pulmonary disease) (HCC)    Coronary artery disease    History of left heart catheterization    w/ pci to RCA   Hx of migraine headaches    Hypertension    Myocardial infarction [facility]) 2010   Personal history of chemotherapy    Wears dentures    full upper (currently unable to wear)    Past Surgical History:  Procedure Laterality Date   ABDOMINAL HYSTERECTOMY     LEFT HEART CATH AND CORONARY ANGIOGRAPHY Left 09/07/2019   Procedure: LEFT HEART CATH AND CORONARY ANGIOGRAPHY;  Surgeon: Alwyn Pea, MD;  Location: [facility] INVASIVE CV LAB;  Service: Cardiovascular;  Laterality: Left;   LESION EXCISION WITH COMPLEX REPAIR Right 12/04/2020   Procedure: Excision right upper lip intraoral lesion;  Surgeon: Geanie Logan, MD;  Location: [facility];  Service: ENT;  Laterality: Right;  Latex   MASTECTOMY Left 2008   MINOR EXCISION OF ORAL LESION N/A 10/02/2020   Procedure: excision of right upper lip mass, intraoral;  Surgeon: Geanie Logan, MD;  Location: [facility];  Service: ENT;  Laterality: N/A;  Latex    There were no vitals filed for this  visit.   Subjective Assessment - 11/04/22 1505     Subjective  Pain started after my stitches was taken out - tried to do some exercises and scar massage -but it is so tender and burning in my thumb, pinkie and ring finger- cannot touch my palm - 10/10 pain - cannot grip or pinch - or use my R hand and wrist - my whole arm hurts    Pertinent History 10/24/22 Ortho NOTE DR [name]: Tonya Keith is a 62 y.o. female here today who is 5.5 weeks status post carpal tunnel release performed on 09/10/2022. At this time, she is having burning pain predominantly in the thumb, ring, and little fingers after having had prior carpal tunnel release.  The patient has been engaging in hand massages. She notes burning sensation from opposing her thumb. Refer to OT    Patient Stated Goals I want the pain and tenderness better in my R hand and wrist so I can use my hand to cook ,clean, get dress, bath and brush my teeth    Currently in Pain? Yes    Pain Score 10-Worst pain ever    Pain Location Hand    Pain Orientation Right    Pain Descriptors / Indicators Tightness;Tender;Throbbing;Burning;Aching    Pain Type Surgical pain  Pain Onset More than a month ago    Pain Frequency Constant               [facility] OT Assessment - 11/04/22 0001       Assessment   Medical Diagnosis R CTR    Referring Provider (OT) Dr [name]    Onset Date/Surgical Date 09/10/22    Hand Dominance Right    Next MD Visit Next Month      Home  Environment   Lives With Alone      Prior Function   Vocation Retired    [employer] work, cooking, read on tablet and phone      AROM   Overall AROM Comments guard whole R arm - pain with sup/pro, elbow flexion , ext and over head    Right Wrist Extension 35 Degrees   pain volar scar   Right Wrist Flexion 90 Degrees    Right Wrist Radial Deviation 20 Degrees   pain wrist   Right Wrist Ulnar Deviation 30 Degrees   pain at wrist     Right Hand AROM   R Thumb Radial ABduction/ADduction  0-55 48    R Thumb Palmar ABduction/ADduction 0-45 55    R Thumb Opposition to Index --   Opposition to 3rd - pain to 4th and 5th in thumb   R Index  MCP 0-90 90 Degrees    R Index PIP 0-100 100 Degrees    R Long  MCP 0-90 90 Degrees    R Long PIP 0-100 100 Degrees    R Ring  MCP 0-90 90 Degrees    R Ring PIP 0-100 100 Degrees    R Little  MCP 0-90 90 Degrees    R Little PIP 0-100 100 Degrees                 Done contrast with patient prior to review of desensitization using soft massage, soft textures and rougher textures several times during the day Cica -Care scar pad for nighttime use with a Tubigrip D Tendon glides pain-free 10 reps Active range of motion for thumb palmar radial abduction 10 reps Opposition to digits 10 reps Active range of motion for wrist flexion and extension 10 reps As well as radial ulnar deviation 10 reps Patient also to do daily some shoulder and elbow and supination pronation home exercises             OT Education - 11/04/22 1519     Education Details Findings of evaluation and home program    Person(s) Educated Patient    Methods Explanation;Demonstration;Tactile cues;Verbal cues;Handout    Comprehension Verbal cues required;Returned demonstration;Verbalized understanding                 OT Long Term Goals - 11/04/22 1626       OT LONG TERM GOAL #1   Title Patient to be independent in home program to decrease hypersensitivity and tenderness over the scar to be able to tolerate drying with a towel symptom-free    Baseline 10/10 tenderness with soft massage, soft textures and rougher textures.    Time 3    Period Weeks    Status New    Target Date 11/25/22      OT LONG TERM GOAL #2   Title R Thumb active range of motion increased to within normal limits for palmar /radial abduction to pick up  half a cup and opposition to within normal limits to do buttons  symptom-free    Baseline Opposition to third digit.   Discomfort and hold to his fourth.  It burning pain increased as well as increased burn and pain in thumb with palmar radial abduction at 48 to 55 degrees    Time 4    Period Weeks    Status New    Target Date 12/02/22      OT LONG TERM GOAL #3   Title Right wrist active range of motion increased to within normal limits and symptom-free for patient to initiate using right hand in bathing and dressing symptom-free    Baseline Right wrist extension 35.  Flexion and radial ulnar deviation within normal limits for motion but discomfort and burning pain 10/10 over scar    Time 5    Period Weeks    Status New    Target Date 12/09/22      OT LONG TERM GOAL #4   Title Right grip and prehension strength increased to more than 50% compared to the left for patient to use hand in bathing and dressing with no increase symptoms    Baseline Patient not using right hand increased burning pain as well as hypersensitivity and tenderness over the scar 10/10 discomfort in    Time 8    Period Weeks    Status New    Target Date 12/30/22      OT LONG TERM GOAL #5   Title R Wrist and forearm range motion and strength increased to within normal limits for patient tender doorknob and push pull heavy door with no increase in terms    Baseline Scar hypersensitive as well as increased burning pain with touch as well as to textures.  Increased 10/10.  Not using hands.  As well as favoring and guarding right arm    Time 8    Period Weeks    Status New    Target Date 12/30/22                   Plan - 11/04/22 1521     Clinical Impression Statement Patient presented at OT evaluation with a diagnosis of right dominant hand carpal tunnel release on 09/10/2022.  By Dr. [name].  Patient report with hypersensitivity and tenderness over scar with increased edema.  Patient with burning pain into the thumb and fourth of fifth digit constantly - worse with AROM 10/10 .  Patient limited in active range of motion for  wrist extension and increased pain in other ranges.  Patient increased discomfort and pain with composite flexion of digits.  Increased burning pain and discomfort with thumb motion in all planes.  Limiting her functional use of right dominant hand and wrist in ADLs and IADLs.  Patient guarding the right upper extremity causing some discomfort in limited motion for shoulder, elbow and forearm.  Patient can benefit from skilled OT services to increase functional use of right dominant hand to prior level of function.    OT Occupational Profile and History Problem Focused Assessment - Including review of records relating to presenting problem    Occupational performance deficits (Please refer to evaluation for details): ADL's;IADL's;Rest and Sleep;Play;Social Participation;Leisure    Body Structure / Function / Physical Skills ADL;Edema;Dexterity;Decreased knowledge of precautions;Flexibility;ROM;UE functional use;Scar mobility;Sensation;Pain;Strength;IADL    Rehab Potential Good    Clinical Decision Making Limited treatment options, no task modification necessary    Comorbidities Affecting Occupational Performance: May have comorbidities impacting occupational performance   DM   Modification or Assistance to Complete  Evaluation  No modification of tasks or assist necessary to complete eval    OT Frequency 2x / week    OT Duration 8 weeks    OT Treatment/Interventions Self-care/ADL training;Contrast Bath;Fluidtherapy;Paraffin;Therapeutic exercise;DME and/or AE instruction;Manual Therapy;Passive range of motion;Scar mobilization;Splinting;Patient/family education    Consulted and Agree with Plan of Care Patient             Patient will benefit from skilled therapeutic intervention in order to improve the following deficits and impairments:   Body Structure / Function / Physical Skills: ADL, Edema, Dexterity, Decreased knowledge of precautions, Flexibility, ROM, UE functional use, Scar mobility,  Sensation, Pain, Strength, IADL       Visit Diagnosis: Carpal tunnel syndrome, right upper limb  Pain in right hand  Scar condition and fibrosis of skin  Pain in right wrist  Muscle weakness (generalized)    Problem List Patient Active Problem List   Diagnosis Date Noted   Osteoarthritis of hips (Bilateral) 11/03/2022   Lumbar radiculitis (L5, S1) (Bilateral) (L>R) 11/03/2022   Lumbosacral radiculopathy/radiculitis at S1 (Bilateral) (L>R) 11/03/2022   Lumbosacral radiculopathy/radiculitis at L5 (Bilateral) (L>R) 11/03/2022   Abnormal drug screen (09/22/2022) 09/25/2022   Chronic hip pain (Bilateral) (R>L) 09/22/2022   Decreased range of motion of hips (Bilateral) (R>L) 09/22/2022   Chronic low back pain (2ry area of Pain) (Bilateral) (R>L) w/o sciatica 09/22/2022   Decreased range of motion of lumbar spine 09/22/2022   Generalized osteoarthritis of multiple sites 09/22/2022   History of marijuana use 09/22/2022   Heart murmur 09/21/2022   Myocardial infarction (HCC) 09/21/2022   Chronic pain syndrome 09/21/2022   Pharmacologic therapy 09/21/2022   Disorder of skeletal system 09/21/2022   Problems influencing health status 09/21/2022   Memory impairment 08/15/2022   Type 2 diabetes mellitus (HCC) 02/04/2022   Other insomnia 08/28/2019   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   Chronic obstructive pulmonary disease (HCC) 03/06/2019   Coronary artery disease involving native coronary artery of native heart without angina pectoris 03/06/2019   Major depressive disorder, recurrent, moderate (HCC) 03/06/2019   Malignant neoplasm of left female breast (HCC) 03/06/2019   Osteopenia 03/06/2019   Vitamin D insufficiency 03/06/2019   Hypothermia 12/03/2017   Chronic pain of lower extremity (1ry area of Pain) (Bilateral) 11/17/2017   Bilateral lower extremity edema 11/17/2017   Moderate tobacco use disorder 11/17/2017   Hypertension 11/17/2017   Hypercholesterolemia 11/17/2017     Oletta Cohn, OTR/L,CLT 11/04/2022, 4:31 PM  [facility] [address] [place], [address] Phone: [phone]   Fax:  [phone]  Name: Tonya Keith MRN: [id number] Date of Birth: 1960-10-06

## 2022-11-06 ENCOUNTER — Ambulatory Visit: Payer: 59 | Admitting: Occupational Therapy

## 2022-11-06 DIAGNOSIS — G5601 Carpal tunnel syndrome, right upper limb: Secondary | ICD-10-CM

## 2022-11-06 DIAGNOSIS — L905 Scar conditions and fibrosis of skin: Secondary | ICD-10-CM

## 2022-11-06 DIAGNOSIS — M6281 Muscle weakness (generalized): Secondary | ICD-10-CM

## 2022-11-06 DIAGNOSIS — M25531 Pain in right wrist: Secondary | ICD-10-CM

## 2022-11-06 DIAGNOSIS — M79641 Pain in right hand: Secondary | ICD-10-CM

## 2022-11-06 NOTE — Therapy (Signed)
[facility] [address] [place], [place], [address] Phone: [phone]   Fax:  [phone]  Occupational Therapy Treatment  Patient Details  Name: Tonya Keith MRN: [id number] Date of Birth: Oct 08, 1960 Referring Provider (OT): Dr [name]   Encounter Date: 11/06/2022   OT End of Session - 11/06/22 0945     Visit Number 2    Number of Visits 12    Date for OT Re-Evaluation 12/30/22    OT Start Time 0945    OT Stop Time 1031    OT Time Calculation (min) 46 min    Activity Tolerance Patient tolerated treatment well    Behavior During Therapy [facility] for tasks assessed/performed             Past Medical History:  Diagnosis Date   Anemia    Anginal pain (HCC)    Breast cancer (HCC) 2008   chemo mastectomy left   Cancer (HCC)    breast   COPD (chronic obstructive pulmonary disease) (HCC)    Coronary artery disease    History of left heart catheterization    w/ pci to RCA   Hx of migraine headaches    Hypertension    Myocardial infarction [facility]) 2010   Personal history of chemotherapy    Wears dentures    full upper (currently unable to wear)    Past Surgical History:  Procedure Laterality Date   ABDOMINAL HYSTERECTOMY     LEFT HEART CATH AND CORONARY ANGIOGRAPHY Left 09/07/2019   Procedure: LEFT HEART CATH AND CORONARY ANGIOGRAPHY;  Surgeon: Alwyn Pea, MD;  Location: [facility] INVASIVE CV LAB;  Service: Cardiovascular;  Laterality: Left;   LESION EXCISION WITH COMPLEX REPAIR Right 12/04/2020   Procedure: Excision right upper lip intraoral lesion;  Surgeon: Geanie Logan, MD;  Location: [facility];  Service: ENT;  Laterality: Right;  Latex   MASTECTOMY Left 2008   MINOR EXCISION OF ORAL LESION N/A 10/02/2020   Procedure: excision of right upper lip mass, intraoral;  Surgeon: Geanie Logan, MD;  Location: [facility];  Service: ENT;  Laterality: N/A;  Latex    There were no vitals filed for this  visit.   Subjective Assessment - 11/06/22 0945     Subjective  Done exercises - after some warm water and cold water- motion is better - look - but still buring pain over the scar and wrist    Pertinent History 10/24/22 Ortho NOTE DR [name]: Tonya Keith is a 62 y.o. female here today who is 5.5 weeks status post carpal tunnel release performed on 09/10/2022. At this time, she is having burning pain predominantly in the thumb, ring, and little fingers after having had prior carpal tunnel release.  The patient has been engaging in hand massages. She notes burning sensation from opposing her thumb. Refer to OT    Patient Stated Goals I want the pain and tenderness better in my R hand and wrist so I can use my hand to cook ,clean, get dress, bath and brush my teeth    Currently in Pain? Yes    Pain Score 9     Pain Location Hand    Pain Orientation Right    Pain Descriptors / Indicators Tightness;Burning    Pain Type Surgical pain    Pain Onset More than a month ago    Pain Frequency Constant                [facility]  OT Assessment - 11/06/22 0001       AROM   Overall AROM Comments SHoulder and elbow , sup/pro WNL    Right Wrist Flexion 90 Degrees    Right Wrist Radial Deviation 20 Degrees    Right Wrist Ulnar Deviation 30 Degrees      Right Hand AROM   R Thumb Radial ABduction/ADduction 0-55 55    R Thumb Palmar ABduction/ADduction 0-45 55    R Thumb Opposition to Index --   Opposition to 5th -slight pull             Great progress and less fear /guarding with R shoulder ,elbow and forearm AROM  Increase wrist and thumb AROM in all planes         OT Treatments/Exercises (OP) - 11/06/22 0001       RUE Fluidotherapy   Number Minutes Fluidotherapy 8 Minutes    RUE Fluidotherapy Location Hand;Wrist    Comments alternate with ice - 1 min - x 2 -prior to soft tissue             Cont at home contrast prior to  desensitization using soft massage, soft textures and rougher  textures several times during the day Done this date scar massage over cica scar pad , MC spreads and webspace Scar massage to do over cica scar pad Add rolling over red foam roller for soft tissue on volar palm ,and digits - forearm  2 min  Cica -Care scar pad for nighttime with tubi grip   Tendon glides pain-free 10 reps Active range of motion for thumb palmar radial abduction 10 reps Opposition to all digits 10 reps- pain to 5th  Active range of motion for wrist flexion and extension 10 reps Pain increase in wrist ext  As well as radial ulnar deviation 10 reps Patient  to cont with shoulder and elbow and supination pronation home exercises       OT Education - 11/06/22 0945     Education Details progress and changes in HEP    Person(s) Educated Patient    Methods Explanation;Demonstration;Tactile cues;Verbal cues;Handout    Comprehension Verbal cues required;Returned demonstration;Verbalized understanding                 OT Long Term Goals - 11/04/22 1626       OT LONG TERM GOAL #1   Title Patient to be independent in home program to decrease hypersensitivity and tenderness over the scar to be able to tolerate drying with a towel symptom-free    Baseline 10/10 tenderness with soft massage, soft textures and rougher textures.    Time 3    Period Weeks    Status New    Target Date 11/25/22      OT LONG TERM GOAL #2   Title R Thumb active range of motion increased to within normal limits for palmar /radial abduction to pick up  half a cup and opposition to within normal limits to do buttons symptom-free    Baseline Opposition to third digit.  Discomfort and hold to his fourth.  It burning pain increased as well as increased burn and pain in thumb with palmar radial abduction at 48 to 55 degrees    Time 4    Period Weeks    Status New    Target Date 12/02/22      OT LONG TERM GOAL #3   Title Right wrist active range of motion increased to within normal limits and  symptom-free  for patient to initiate using right hand in bathing and dressing symptom-free    Baseline Right wrist extension 35.  Flexion and radial ulnar deviation within normal limits for motion but discomfort and burning pain 10/10 over scar    Time 5    Period Weeks    Status New    Target Date 12/09/22      OT LONG TERM GOAL #4   Title Right grip and prehension strength increased to more than 50% compared to the left for patient to use hand in bathing and dressing with no increase symptoms    Baseline Patient not using right hand increased burning pain as well as hypersensitivity and tenderness over the scar 10/10 discomfort in    Time 8    Period Weeks    Status New    Target Date 12/30/22      OT LONG TERM GOAL #5   Title R Wrist and forearm range motion and strength increased to within normal limits for patient tender doorknob and push pull heavy door with no increase in terms    Baseline Scar hypersensitive as well as increased burning pain with touch as well as to textures.  Increased 10/10.  Not using hands.  As well as favoring and guarding right arm    Time 8    Period Weeks    Status New    Target Date 12/30/22                   Plan - 11/06/22 0945     Clinical Impression Statement Patient presented at OT evaluation with a diagnosis of right dominant hand carpal tunnel release on 09/10/2022.  By Dr. [name].  Patient report with hypersensitivity and tenderness over scar with increased edema. At eval burning pain into the thumb and fourth of fifth digit constantly - worse with AROM 10/10 .  Patient limited in active range of motion for wrist extension and increased pain in other ranges.  Patient increased discomfort and pain with composite flexion of digits.  Increased burning pain and discomfort with thumb motion in all planes.  Pt arrive with great progress in  shoulder, elbow and forearm AROM pain free- increase wrist AROM and thumb in al planes. Buring pain decrease  in session to 5/10.  Limiting her functional use of right dominant hand and wrist in ADLs and IADLs.  Patient guarding the right upper extremity causing some discomfort in limited motion for shoulder, elbow and forearm.  Patient can benefit from skilled OT services to increase functional use of right dominant hand to prior level of function.    OT Occupational Profile and History Problem Focused Assessment - Including review of records relating to presenting problem    Occupational performance deficits (Please refer to evaluation for details): ADL's;IADL's;Rest and Sleep;Play;Social Participation;Leisure    Body Structure / Function / Physical Skills ADL;Edema;Dexterity;Decreased knowledge of precautions;Flexibility;ROM;UE functional use;Scar mobility;Sensation;Pain;Strength;IADL    Rehab Potential Good    Clinical Decision Making Limited treatment options, no task modification necessary    Comorbidities Affecting Occupational Performance: May have comorbidities impacting occupational performance    Modification or Assistance to Complete Evaluation  No modification of tasks or assist necessary to complete eval    OT Frequency 2x / week    OT Duration 8 weeks    OT Treatment/Interventions Self-care/ADL training;Contrast Bath;Fluidtherapy;Paraffin;Therapeutic exercise;DME and/or AE instruction;Manual Therapy;Passive range of motion;Scar mobilization;Splinting;Patient/family education    Consulted and Agree with Plan of Care Patient  Patient will benefit from skilled therapeutic intervention in order to improve the following deficits and impairments:   Body Structure / Function / Physical Skills: ADL, Edema, Dexterity, Decreased knowledge of precautions, Flexibility, ROM, UE functional use, Scar mobility, Sensation, Pain, Strength, IADL       Visit Diagnosis: Carpal tunnel syndrome, right upper limb  Pain in right hand  Scar condition and fibrosis of skin  Pain in right  wrist  Muscle weakness (generalized)    Problem List Patient Active Problem List   Diagnosis Date Noted   Osteoarthritis of hips (Bilateral) 11/03/2022   Lumbar radiculitis (L5, S1) (Bilateral) (L>R) 11/03/2022   Lumbosacral radiculopathy/radiculitis at S1 (Bilateral) (L>R) 11/03/2022   Lumbosacral radiculopathy/radiculitis at L5 (Bilateral) (L>R) 11/03/2022   Abnormal drug screen (09/22/2022) 09/25/2022   Chronic hip pain (Bilateral) (R>L) 09/22/2022   Decreased range of motion of hips (Bilateral) (R>L) 09/22/2022   Chronic low back pain (2ry area of Pain) (Bilateral) (R>L) w/o sciatica 09/22/2022   Decreased range of motion of lumbar spine 09/22/2022   Generalized osteoarthritis of multiple sites 09/22/2022   History of marijuana use 09/22/2022   Heart murmur 09/21/2022   Myocardial infarction (HCC) 09/21/2022   Chronic pain syndrome 09/21/2022   Pharmacologic therapy 09/21/2022   Disorder of skeletal system 09/21/2022   Problems influencing health status 09/21/2022   Memory impairment 08/15/2022   Type 2 diabetes mellitus (HCC) 02/04/2022   Other insomnia 08/28/2019   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   Chronic obstructive pulmonary disease (HCC) 03/06/2019   Coronary artery disease involving native coronary artery of native heart without angina pectoris 03/06/2019   Major depressive disorder, recurrent, moderate (HCC) 03/06/2019   Malignant neoplasm of left female breast (HCC) 03/06/2019   Osteopenia 03/06/2019   Vitamin D insufficiency 03/06/2019   Hypothermia 12/03/2017   Chronic pain of lower extremity (1ry area of Pain) (Bilateral) 11/17/2017   Bilateral lower extremity edema 11/17/2017   Moderate tobacco use disorder 11/17/2017   Hypertension 11/17/2017   Hypercholesterolemia 11/17/2017    Oletta Cohn, OTR/L,CLT 11/06/2022, 10:33 AM  [facility] [address] [place], [address] Phone:  [phone]   Fax:  [phone]  Name: Tonya Keith MRN: [id number] Date of Birth: 1960-09-07

## 2022-11-11 ENCOUNTER — Ambulatory Visit: Payer: 59 | Admitting: Occupational Therapy

## 2022-11-11 DIAGNOSIS — M6281 Muscle weakness (generalized): Secondary | ICD-10-CM

## 2022-11-11 DIAGNOSIS — M79641 Pain in right hand: Secondary | ICD-10-CM

## 2022-11-11 DIAGNOSIS — G5601 Carpal tunnel syndrome, right upper limb: Secondary | ICD-10-CM

## 2022-11-11 DIAGNOSIS — L905 Scar conditions and fibrosis of skin: Secondary | ICD-10-CM

## 2022-11-11 DIAGNOSIS — M25531 Pain in right wrist: Secondary | ICD-10-CM

## 2022-11-11 NOTE — Therapy (Signed)
[facility] [address] [place], [place], [address] Phone: [phone]   Fax:  [phone]  Occupational Therapy Treatment  Patient Details  Name: Tonya Keith MRN: [id number] Date of Birth: [birth year] Referring Provider (OT): Dr [name]   Encounter Date: 11/11/2022   OT End of Session - 11/11/22 1058     Visit Number 3    Number of Visits 12    Date for OT Re-Evaluation 12/30/22    OT Start Time 1032    OT Stop Time 1115    OT Time Calculation (min) 43 min    Activity Tolerance Patient tolerated treatment well    Behavior During Therapy [facility] for tasks assessed/performed             Past Medical History:  Diagnosis Date   Anemia    Anginal pain (HCC)    Breast cancer (HCC) 2008   chemo mastectomy left   Cancer (HCC)    breast   COPD (chronic obstructive pulmonary disease) (HCC)    Coronary artery disease    History of left heart catheterization    w/ pci to RCA   Hx of migraine headaches    Hypertension    Myocardial infarction [facility]) 2010   Personal history of chemotherapy    Wears dentures    full upper (currently unable to wear)    Past Surgical History:  Procedure Laterality Date   ABDOMINAL HYSTERECTOMY     LEFT HEART CATH AND CORONARY ANGIOGRAPHY Left 09/07/2019   Procedure: LEFT HEART CATH AND CORONARY ANGIOGRAPHY;  Surgeon: Alwyn Pea, MD;  Location: [facility] INVASIVE CV LAB;  Service: Cardiovascular;  Laterality: Left;   LESION EXCISION WITH COMPLEX REPAIR Right 12/04/2020   Procedure: Excision right upper lip intraoral lesion;  Surgeon: Geanie Logan, MD;  Location: [facility];  Service: ENT;  Laterality: Right;  Latex   MASTECTOMY Left 2008   MINOR EXCISION OF ORAL LESION N/A 10/02/2020   Procedure: excision of right upper lip mass, intraoral;  Surgeon: Geanie Logan, MD;  Location: [facility];  Service: ENT;  Laterality: N/A;  Latex    There were no vitals filed for this  visit.   Subjective Assessment - 11/11/22 1054     Subjective  Doing better - burning pain is only down here now around my scar now - using it more    Pertinent History 10/24/22 Ortho NOTE DR [name]: Tonya Keith is a 62 y.o. female here today who is 5.5 weeks status post carpal tunnel release performed on 09/10/2022. At this time, she is having burning pain predominantly in the thumb, ring, and little fingers after having had prior carpal tunnel release.  The patient has been engaging in hand massages. She notes burning sensation from opposing her thumb. Refer to OT    Patient Stated Goals I want the pain and tenderness better in my R hand and wrist so I can use my hand to cook ,clean, get dress, bath and brush my teeth    Currently in Pain? Yes    Pain Score 6     Pain Location Hand    Pain Orientation Right    Pain Descriptors / Indicators Tightness;Aching;Stabbing    Pain Type Surgical pain    Pain Onset More than a month ago                [facility] OT Assessment - 11/11/22 0001       AROM  Right Wrist Extension 60 Degrees    Right Wrist Flexion 90 Degrees    Right Wrist Radial Deviation 20 Degrees    Right Wrist Ulnar Deviation 30 Degrees      Right Hand AROM   R Thumb Radial ABduction/ADduction 0-55 55   pull   R Thumb Palmar ABduction/ADduction 0-45 60    R Thumb Opposition to Index --   Opposition to base of 5th - pull               Great progress- burning pain not as intense or constant Pain mostly around scar now with thumb RA and opposition And wrist flexion , ext end range       OT Treatments/Exercises (OP) - 11/11/22 0001       RUE Fluidotherapy   Number Minutes Fluidotherapy 8 Minutes    RUE Fluidotherapy Location Hand;Wrist    Comments AROM all joints and cigits -             Cont at home contrast prior to scar massage- pt can tolerate different textures now   Done this date scar massage over cica scar pad  done - using mini massager with  great success  MC spreads , CT spreads and webspace done Cont  rolling over red foam roller for soft tissue on volar palm ,and digits - forearm  2 min  Cica -Care scar pad for nighttime with tubi grip    Tendon glides pain-free 10 reps Active range of motion for thumb palmar /radial abduction 10 reps Opposition to all digits  - sliding down 4th and 5th 10 reps- keep motion - do not force Active range of motion for wrist flexion and extension 10 reps Add table slide 20 reps - slight pull        OT Education - 11/11/22 1058     Education Details progress and changes in HEP    Person(s) Educated Patient    Methods Explanation;Demonstration;Tactile cues;Verbal cues;Handout    Comprehension Verbal cues required;Returned demonstration;Verbalized understanding                 OT Long Term Goals - 11/04/22 1626       OT LONG TERM GOAL #1   Title Patient to be independent in home program to decrease hypersensitivity and tenderness over the scar to be able to tolerate drying with a towel symptom-free    Baseline 10/10 tenderness with soft massage, soft textures and rougher textures.    Time 3    Period Weeks    Status New    Target Date 11/25/22      OT LONG TERM GOAL #2   Title R Thumb active range of motion increased to within normal limits for palmar /radial abduction to pick up  half a cup and opposition to within normal limits to do buttons symptom-free    Baseline Opposition to third digit.  Discomfort and hold to his fourth.  It burning pain increased as well as increased burn and pain in thumb with palmar radial abduction at 48 to 55 degrees    Time 4    Period Weeks    Status New    Target Date 12/02/22      OT LONG TERM GOAL #3   Title Right wrist active range of motion increased to within normal limits and symptom-free for patient to initiate using right hand in bathing and dressing symptom-free    Baseline Right wrist extension 35.  Flexion and radial ulnar  deviation within normal limits for motion but discomfort and burning pain 10/10 over scar    Time 5    Period Weeks    Status New    Target Date 12/09/22      OT LONG TERM GOAL #4   Title Right grip and prehension strength increased to more than 50% compared to the left for patient to use hand in bathing and dressing with no increase symptoms    Baseline Patient not using right hand increased burning pain as well as hypersensitivity and tenderness over the scar 10/10 discomfort in    Time 8    Period Weeks    Status New    Target Date 12/30/22      OT LONG TERM GOAL #5   Title R Wrist and forearm range motion and strength increased to within normal limits for patient tender doorknob and push pull heavy door with no increase in terms    Baseline Scar hypersensitive as well as increased burning pain with touch as well as to textures.  Increased 10/10.  Not using hands.  As well as favoring and guarding right arm    Time 8    Period Weeks    Status New    Target Date 12/30/22                   Plan - 11/11/22 1058     Clinical Impression Statement Patient presented at OT evaluation with a diagnosis of right dominant hand carpal tunnel release on 09/10/2022.  By Dr. [name].  Patient report with hypersensitivity and tenderness over scar with increased edema. At eval burning pain into the thumb and fourth of fifth digit constantly - worse with AROM 10/10 .  Patient limited in active range of motion for wrist extension and increased pain in other ranges.  Patient increased discomfort and pain with composite flexion of digits.  Increased burning pain and discomfort with thumb motion in all planes.  Pt arrive with great progress in  shoulder, elbow and forearm AROM pain free- increase wrist AROM in all planes - wrist improve from 35 to 60 degrees- and thumb AROM WNL but burning pain end range RA and opposition but degrees to 6/10. Burning pain not as intense and constant per pt - add composite  extention stretch for table slides and able to do more scar mobs today - using xtractor and mini massager over Cica scar pad.  Limiting her functional use of right dominant hand and wrist in ADLs and IADLs.  Patient guarding the right upper extremity causing some discomfort in limited motion for shoulder, elbow and forearm.  Patient can benefit from skilled OT services to increase functional use of right dominant hand to prior level of function.    OT Occupational Profile and History Problem Focused Assessment - Including review of records relating to presenting problem    Occupational performance deficits (Please refer to evaluation for details): ADL's;IADL's;Rest and Sleep;Play;Social Participation;Leisure    Body Structure / Function / Physical Skills ADL;Edema;Dexterity;Decreased knowledge of precautions;Flexibility;ROM;UE functional use;Scar mobility;Sensation;Pain;Strength;IADL    Rehab Potential Good    Clinical Decision Making Limited treatment options, no task modification necessary    Comorbidities Affecting Occupational Performance: May have comorbidities impacting occupational performance    Modification or Assistance to Complete Evaluation  No modification of tasks or assist necessary to complete eval    OT Frequency 2x / week    OT Duration 8 weeks    OT Treatment/Interventions Self-care/ADL training;Contrast Bath;Fluidtherapy;Paraffin;Therapeutic  exercise;DME and/or AE instruction;Manual Therapy;Passive range of motion;Scar mobilization;Splinting;Patient/family education    Consulted and Agree with Plan of Care Patient             Patient will benefit from skilled therapeutic intervention in order to improve the following deficits and impairments:   Body Structure / Function / Physical Skills: ADL, Edema, Dexterity, Decreased knowledge of precautions, Flexibility, ROM, UE functional use, Scar mobility, Sensation, Pain, Strength, IADL       Visit Diagnosis: Carpal tunnel  syndrome, right upper limb  Pain in right hand  Scar condition and fibrosis of skin  Pain in right wrist  Muscle weakness (generalized)    Problem List Patient Active Problem List   Diagnosis Date Noted   Osteoarthritis of hips (Bilateral) 11/03/2022   Lumbar radiculitis (L5, S1) (Bilateral) (L>R) 11/03/2022   Lumbosacral radiculopathy/radiculitis at S1 (Bilateral) (L>R) 11/03/2022   Lumbosacral radiculopathy/radiculitis at L5 (Bilateral) (L>R) 11/03/2022   Abnormal drug screen (09/22/2022) 09/25/2022   Chronic hip pain (Bilateral) (R>L) 09/22/2022   Decreased range of motion of hips (Bilateral) (R>L) 09/22/2022   Chronic low back pain (2ry area of Pain) (Bilateral) (R>L) w/o sciatica 09/22/2022   Decreased range of motion of lumbar spine 09/22/2022   Generalized osteoarthritis of multiple sites 09/22/2022   History of marijuana use 09/22/2022   Heart murmur 09/21/2022   Myocardial infarction (HCC) 09/21/2022   Chronic pain syndrome 09/21/2022   Pharmacologic therapy 09/21/2022   Disorder of skeletal system 09/21/2022   Problems influencing health status 09/21/2022   Memory impairment 08/15/2022   Type 2 diabetes mellitus (HCC) 02/04/2022   Other insomnia 08/28/2019   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   Chronic obstructive pulmonary disease (HCC) 03/06/2019   Coronary artery disease involving native coronary artery of native heart without angina pectoris 03/06/2019   Major depressive disorder, recurrent, moderate (HCC) 03/06/2019   Malignant neoplasm of left female breast (HCC) 03/06/2019   Osteopenia 03/06/2019   Vitamin D insufficiency 03/06/2019   Hypothermia 12/03/2017   Chronic pain of lower extremity (1ry area of Pain) (Bilateral) 11/17/2017   Bilateral lower extremity edema 11/17/2017   Moderate tobacco use disorder 11/17/2017   Hypertension 11/17/2017   Hypercholesterolemia 11/17/2017    Oletta Cohn, OTR/L,CLT 11/11/2022, 12:22 PM  [facility] [address] [place], [address] Phone: [phone]   Fax:  [phone]  Name: SHAUNAE RAMDASS MRN: [id number] Date of Birth: [birth year]

## 2022-11-13 ENCOUNTER — Ambulatory Visit: Payer: 59 | Admitting: Occupational Therapy

## 2022-11-14 ENCOUNTER — Ambulatory Visit: Payer: 59 | Admitting: Occupational Therapy

## 2022-11-14 DIAGNOSIS — M79641 Pain in right hand: Secondary | ICD-10-CM

## 2022-11-14 DIAGNOSIS — M25531 Pain in right wrist: Secondary | ICD-10-CM

## 2022-11-14 DIAGNOSIS — G5601 Carpal tunnel syndrome, right upper limb: Secondary | ICD-10-CM

## 2022-11-14 DIAGNOSIS — L905 Scar conditions and fibrosis of skin: Secondary | ICD-10-CM

## 2022-11-14 DIAGNOSIS — M6281 Muscle weakness (generalized): Secondary | ICD-10-CM

## 2022-11-14 NOTE — Therapy (Signed)
[facility] [address] [place], [place], [address] Phone: [phone]   Fax:  [phone]  Occupational Therapy Treatment  Patient Details  Name: Tonya Keith MRN: [id number] Date of Birth: 02/28/1961 Referring Provider (OT): Dr [name]   Encounter Date: 11/14/2022   OT End of Session - 11/14/22 1000     Visit Number 4    Number of Visits 12    Date for OT Re-Evaluation 12/30/22    OT Start Time 0950    OT Stop Time 1030    OT Time Calculation (min) 40 min    Activity Tolerance Patient tolerated treatment well    Behavior During Therapy [facility] for tasks assessed/performed             Past Medical History:  Diagnosis Date   Anemia    Anginal pain (HCC)    Breast cancer (HCC) 2008   chemo mastectomy left   Cancer (HCC)    breast   COPD (chronic obstructive pulmonary disease) (HCC)    Coronary artery disease    History of left heart catheterization    w/ pci to RCA   Hx of migraine headaches    Hypertension    Myocardial infarction [facility]) 2010   Personal history of chemotherapy    Wears dentures    full upper (currently unable to wear)    Past Surgical History:  Procedure Laterality Date   ABDOMINAL HYSTERECTOMY     LEFT HEART CATH AND CORONARY ANGIOGRAPHY Left 09/07/2019   Procedure: LEFT HEART CATH AND CORONARY ANGIOGRAPHY;  Surgeon: Alwyn Pea, MD;  Location: [facility] INVASIVE CV LAB;  Service: Cardiovascular;  Laterality: Left;   LESION EXCISION WITH COMPLEX REPAIR Right 12/04/2020   Procedure: Excision right upper lip intraoral lesion;  Surgeon: Geanie Logan, MD;  Location: [facility];  Service: ENT;  Laterality: Right;  Latex   MASTECTOMY Left 2008   MINOR EXCISION OF ORAL LESION N/A 10/02/2020   Procedure: excision of right upper lip mass, intraoral;  Surgeon: Geanie Logan, MD;  Location: [facility];  Service: ENT;  Laterality: N/A;  Latex    There were no vitals filed for this  visit.   Subjective Assessment - 11/14/22 0956     Subjective  My hand and fingers are just stiff in the morning - most stiffness in pinkie and ring finger - burning  mostly on the side of hand down to scar    Pertinent History 10/24/22 Ortho NOTE DR [name]: Tonya Keith is a 62 y.o. female here today who is 5.5 weeks status post carpal tunnel release performed on 09/10/2022. At this time, she is having burning pain predominantly in the thumb, ring, and little fingers after having had prior carpal tunnel release.  The patient has been engaging in hand massages. She notes burning sensation from opposing her thumb. Refer to OT    Patient Stated Goals I want the pain and tenderness better in my R hand and wrist so I can use my hand to cook ,clean, get dress, bath and brush my teeth    Currently in Pain? Yes    Pain Score 5     Pain Location Hand    Pain Orientation Right    Pain Descriptors / Indicators Tightness;Burning    Pain Type Surgical pain    Pain Onset More than a month ago    Pain Frequency Intermittent  Patient arrived today with increased stiffness in the right hand and wrist.  But burning pain still down to about a 5/10 Mostly on radial and ulnar side of scar.       OT Treatments/Exercises (OP) - 11/14/22 0001       RUE Fluidotherapy   Number Minutes Fluidotherapy 8 Minutes    RUE Fluidotherapy Location Hand;Wrist    Comments prior to soft tissue and ROM             Cont at home contrast prior to scar massage- pt can tolerate different textures and scar mobilization     Done this date scar massage over cica scar pad  done - using mini massager with great success With wrist in flexion and extension Use extractor 5-6 times with active range of motion of digits and thumb.-Feeling increased burning pain and stinging.  MC spreads , CT spreads and webspace done Cont  rolling over red foam roller for soft tissue on volar palm ,and digits - forearm   2 min  Cica -Care scar pad for nighttime with tubi grip    Tendon glides pain-free 10 reps Active range of motion for thumb palmar /radial abduction 10 reps Opposition to all digits  - sliding down 4th and 5th 10 reps- keep motion - do not force Active range of motion for wrist flexion and extension 10 reps table slide 20 reps - slight pull  At 1 pound weight for shoulder punches as well as elbow flexion extension, supination and pronation and radial ulnar deviation 2 sets of 12  1 x a day Pain-free incision       OT Education - 11/14/22 0959     Education Details progress and changes in HEP    Person(s) Educated Patient    Methods Explanation;Demonstration;Tactile cues;Verbal cues;Handout    Comprehension Verbal cues required;Returned demonstration;Verbalized understanding                 OT Long Term Goals - 11/04/22 1626       OT LONG TERM GOAL #1   Title Patient to be independent in home program to decrease hypersensitivity and tenderness over the scar to be able to tolerate drying with a towel symptom-free    Baseline 10/10 tenderness with soft massage, soft textures and rougher textures.    Time 3    Period Weeks    Status New    Target Date 11/25/22      OT LONG TERM GOAL #2   Title R Thumb active range of motion increased to within normal limits for palmar /radial abduction to pick up  half a cup and opposition to within normal limits to do buttons symptom-free    Baseline Opposition to third digit.  Discomfort and hold to his fourth.  It burning pain increased as well as increased burn and pain in thumb with palmar radial abduction at 48 to 55 degrees    Time 4    Period Weeks    Status New    Target Date 12/02/22      OT LONG TERM GOAL #3   Title Right wrist active range of motion increased to within normal limits and symptom-free for patient to initiate using right hand in bathing and dressing symptom-free    Baseline Right wrist extension 35.   Flexion and radial ulnar deviation within normal limits for motion but discomfort and burning pain 10/10 over scar    Time 5    Period Weeks  Status New    Target Date 12/09/22      OT LONG TERM GOAL #4   Title Right grip and prehension strength increased to more than 50% compared to the left for patient to use hand in bathing and dressing with no increase symptoms    Baseline Patient not using right hand increased burning pain as well as hypersensitivity and tenderness over the scar 10/10 discomfort in    Time 8    Period Weeks    Status New    Target Date 12/30/22      OT LONG TERM GOAL #5   Title R Wrist and forearm range motion and strength increased to within normal limits for patient tender doorknob and push pull heavy door with no increase in terms    Baseline Scar hypersensitive as well as increased burning pain with touch as well as to textures.  Increased 10/10.  Not using hands.  As well as favoring and guarding right arm    Time 8    Period Weeks    Status New    Target Date 12/30/22                   Plan - 11/14/22 1000     Clinical Impression Statement Patient presented at OT evaluation with a diagnosis of right dominant hand carpal tunnel release on 09/10/2022.  By Dr. [name].  Patient report with hypersensitivity and tenderness over scar with increased edema. At eval burning pain into the thumb and fourth of fifth digit constantly - worse with AROM 10/10 .  Patient limited in active range of motion for wrist extension and increased pain in other ranges.  Patient increased discomfort and pain with composite flexion of digits.  Increased burning pain and discomfort with thumb motion in all planes.  Pt this week cont to show great progress  -WNL ROM at   shoulder, elbow and forearm  pain free- increase wrist AROM in all planes - wrist improve from 35 to 60 degrees- and thumb AROM WNL but burning pain end range RA  more than opposition - pain decrease coming in 5/10   around on radial and ulnar side of scar- improve in session to 2/10.  Can tolerate more scar mobs using xtractor and mini massager over Cica scar pad.  Limited in functional use of right dominant hand and wrist in ADLs and IADLs.  Patient can benefit from skilled OT services to increase functional use of right dominant hand to prior level of function.    OT Occupational Profile and History Problem Focused Assessment - Including review of records relating to presenting problem    Occupational performance deficits (Please refer to evaluation for details): ADL's;IADL's;Rest and Sleep;Play;Social Participation;Leisure    Body Structure / Function / Physical Skills ADL;Edema;Dexterity;Decreased knowledge of precautions;Flexibility;ROM;UE functional use;Scar mobility;Sensation;Pain;Strength;IADL    Rehab Potential Good    Clinical Decision Making Limited treatment options, no task modification necessary    Comorbidities Affecting Occupational Performance: May have comorbidities impacting occupational performance    Modification or Assistance to Complete Evaluation  No modification of tasks or assist necessary to complete eval    OT Frequency 2x / week    OT Duration 8 weeks    OT Treatment/Interventions Self-care/ADL training;Contrast Bath;Fluidtherapy;Paraffin;Therapeutic exercise;DME and/or AE instruction;Manual Therapy;Passive range of motion;Scar mobilization;Splinting;Patient/family education    Consulted and Agree with Plan of Care Patient             Patient will benefit from skilled therapeutic intervention  in order to improve the following deficits and impairments:   Body Structure / Function / Physical Skills: ADL, Edema, Dexterity, Decreased knowledge of precautions, Flexibility, ROM, UE functional use, Scar mobility, Sensation, Pain, Strength, IADL       Visit Diagnosis: Carpal tunnel syndrome, right upper limb  Pain in right hand  Scar condition and fibrosis of skin  Pain in  right wrist  Muscle weakness (generalized)    Problem List Patient Active Problem List   Diagnosis Date Noted   Osteoarthritis of hips (Bilateral) 11/03/2022   Lumbar radiculitis (L5, S1) (Bilateral) (L>R) 11/03/2022   Lumbosacral radiculopathy/radiculitis at S1 (Bilateral) (L>R) 11/03/2022   Lumbosacral radiculopathy/radiculitis at L5 (Bilateral) (L>R) 11/03/2022   Abnormal drug screen (09/22/2022) 09/25/2022   Chronic hip pain (Bilateral) (R>L) 09/22/2022   Decreased range of motion of hips (Bilateral) (R>L) 09/22/2022   Chronic low back pain (2ry area of Pain) (Bilateral) (R>L) w/o sciatica 09/22/2022   Decreased range of motion of lumbar spine 09/22/2022   Generalized osteoarthritis of multiple sites 09/22/2022   History of marijuana use 09/22/2022   Heart murmur 09/21/2022   Myocardial infarction (HCC) 09/21/2022   Chronic pain syndrome 09/21/2022   Pharmacologic therapy 09/21/2022   Disorder of skeletal system 09/21/2022   Problems influencing health status 09/21/2022   Memory impairment 08/15/2022   Type 2 diabetes mellitus (HCC) 02/04/2022   Other insomnia 08/28/2019   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   Chronic obstructive pulmonary disease (HCC) 03/06/2019   Coronary artery disease involving native coronary artery of native heart without angina pectoris 03/06/2019   Major depressive disorder, recurrent, moderate (HCC) 03/06/2019   Malignant neoplasm of left female breast (HCC) 03/06/2019   Osteopenia 03/06/2019   Vitamin D insufficiency 03/06/2019   Hypothermia 12/03/2017   Chronic pain of lower extremity (1ry area of Pain) (Bilateral) 11/17/2017   Bilateral lower extremity edema 11/17/2017   Moderate tobacco use disorder 11/17/2017   Hypertension 11/17/2017   Hypercholesterolemia 11/17/2017    Oletta Cohn, OTR/L,CLT 11/14/2022, 10:53 AM  [facility] [address], [place], [address] Phone:  [phone]   Fax:  [phone]  Name: Tonya Keith MRN: [id number] Date of Birth: [birth year]

## 2022-11-18 ENCOUNTER — Ambulatory Visit: Payer: 59 | Admitting: Occupational Therapy

## 2022-11-18 DIAGNOSIS — M25531 Pain in right wrist: Secondary | ICD-10-CM

## 2022-11-18 DIAGNOSIS — L905 Scar conditions and fibrosis of skin: Secondary | ICD-10-CM

## 2022-11-18 DIAGNOSIS — M79641 Pain in right hand: Secondary | ICD-10-CM

## 2022-11-18 DIAGNOSIS — G5601 Carpal tunnel syndrome, right upper limb: Secondary | ICD-10-CM | POA: Diagnosis not present

## 2022-11-18 DIAGNOSIS — M6281 Muscle weakness (generalized): Secondary | ICD-10-CM

## 2022-11-18 NOTE — Therapy (Signed)
[facility] [address] [place], [place], [address] Phone: [phone]   Fax:  [phone]  Occupational Therapy Treatment  Patient Details  Name: Tonya Keith MRN: [id number] Date of Birth: [birth year] Referring Provider (OT): Dr [name]   Encounter Date: 11/18/2022   OT End of Session - 11/18/22 1049     Visit Number 5    Number of Visits 12    Date for OT Re-Evaluation 12/30/22    OT Start Time 1033    OT Stop Time 1120    OT Time Calculation (min) 47 min    Activity Tolerance Patient tolerated treatment well    Behavior During Therapy [facility] for tasks assessed/performed             Past Medical History:  Diagnosis Date   Anemia    Anginal pain (HCC)    Breast cancer (HCC) 2008   chemo mastectomy left   Cancer (HCC)    breast   COPD (chronic obstructive pulmonary disease) (HCC)    Coronary artery disease    History of left heart catheterization    w/ pci to RCA   Hx of migraine headaches    Hypertension    Myocardial infarction [facility]) 2010   Personal history of chemotherapy    Wears dentures    full upper (currently unable to wear)    Past Surgical History:  Procedure Laterality Date   ABDOMINAL HYSTERECTOMY     LEFT HEART CATH AND CORONARY ANGIOGRAPHY Left 09/07/2019   Procedure: LEFT HEART CATH AND CORONARY ANGIOGRAPHY;  Surgeon: Alwyn Pea, MD;  Location: [facility] INVASIVE [facility];  Service: Cardiovascular;  Laterality: Left;   LESION EXCISION WITH COMPLEX REPAIR Right 12/04/2020   Procedure: Excision right upper lip intraoral lesion;  Surgeon: Geanie Logan, MD;  Location: [facility];  Service: ENT;  Laterality: Right;  Latex   MASTECTOMY Left 2008   MINOR EXCISION OF ORAL LESION N/A 10/02/2020   Procedure: excision of right upper lip mass, intraoral;  Surgeon: Geanie Logan, MD;  Location: [facility];  Service: ENT;  Laterality: N/A;  Latex    There were no vitals filed for this  visit.   Subjective Assessment - 11/18/22 1047     Subjective  A lot of burning pain better- just very stiff in the am - but not able to use it    Pertinent History 10/24/22 Ortho NOTE DR [name]: Tonya Keith is a 62 y.o. female here today who is 5.5 weeks status post carpal tunnel release performed on 09/10/2022. At this time, she is having burning pain predominantly in the thumb, ring, and little fingers after having had prior carpal tunnel release.  The patient has been engaging in hand massages. She notes burning sensation from opposing her thumb. Refer to OT    Patient Stated Goals I want the pain and tenderness better in my R hand and wrist so I can use my hand to cook ,clean, get dress, bath and brush my teeth    Currently in Pain? Yes    Pain Score 4     Pain Location Hand    Pain Orientation Right    Pain Descriptors / Indicators Tightness;Burning    Pain Type Surgical pain    Pain Onset More than a month ago    Pain Frequency Intermittent                [facility] OT Assessment - 11/18/22 0001  Strength   Right Hand Grip (lbs) 2   pain scar   Right Hand Lateral Pinch 5 lbs   pain in thumb   Right Hand 3 Point Pinch 4 lbs   pain in 2nd and 3rd digits   Left Hand Grip (lbs) 35    Left Hand Lateral Pinch 9 lbs    Left Hand 3 Point Pinch 7 lbs                      OT Treatments/Exercises (OP) - 11/18/22 0001       RUE Paraffin   Number Minutes Paraffin 8 Minutes    RUE Paraffin Location Hand   wrist   Comments prior to scar massage and ROM             Cont at home contrast  or heat prior to scar massage at home  pt can tolerate different textures and scar mobilization  much better  Burning area only around scar now    Done this date scar massage using piece of coban and provided for pt to use and ed on use  Use extractor 5-6 times with active range of motion of digits and thumb.-Feeling increased burning pain and stinging bur improved compare to last  time Scar mobs with opposition of thumb to base of 5th .  MC spreads , CT spreads and webspace done Cont  rolling over red foam roller for soft tissue on volar palm ,and digits - forearm  2 min  Cica -Care scar pad for nighttime with tubi grip    Tendon glides pain-free 10 reps Active range of motion for thumb palmar /radial abduction 10 reps Opposition to all digits  - sliding down 4th and 5th 10 reps- keep motion - do not force Active range of motion for wrist flexion and extension 10 reps Prayer stretch review and add to HEP 15 reps -hold 5 sec   1 pound weight for shoulder punches as well as elbow flexion extension, supination and pronation and radial ulnar deviation; and add wrist flexion, ext this date  2 sets of 12  1 x a day Pain-free incision             OT Education - 11/18/22 1049     Education Details progress and changes in HEP    Person(s) Educated Patient    Methods Explanation;Demonstration;Tactile cues;Verbal cues;Handout    Comprehension Verbal cues required;Returned demonstration;Verbalized understanding                 OT Long Term Goals - 11/04/22 1626       OT LONG TERM GOAL #1   Title Patient to be independent in home program to decrease hypersensitivity and tenderness over the scar to be able to tolerate drying with a towel symptom-free    Baseline 10/10 tenderness with soft massage, soft textures and rougher textures.    Time 3    Period Weeks    Status New    Target Date 11/25/22      OT LONG TERM GOAL #2   Title R Thumb active range of motion increased to within normal limits for palmar /radial abduction to pick up  half a cup and opposition to within normal limits to do buttons symptom-free    Baseline Opposition to third digit.  Discomfort and hold to his fourth.  It burning pain increased as well as increased burn and pain in thumb with palmar radial abduction at 48 to  55 degrees    Time 4    Period Weeks    Status New    Target  Date 12/02/22      OT LONG TERM GOAL #3   Title Right wrist active range of motion increased to within normal limits and symptom-free for patient to initiate using right hand in bathing and dressing symptom-free    Baseline Right wrist extension 35.  Flexion and radial ulnar deviation within normal limits for motion but discomfort and burning pain 10/10 over scar    Time 5    Period Weeks    Status New    Target Date 12/09/22      OT LONG TERM GOAL #4   Title Right grip and prehension strength increased to more than 50% compared to the left for patient to use hand in bathing and dressing with no increase symptoms    Baseline Patient not using right hand increased burning pain as well as hypersensitivity and tenderness over the scar 10/10 discomfort in    Time 8    Period Weeks    Status New    Target Date 12/30/22      OT LONG TERM GOAL #5   Title R Wrist and forearm range motion and strength increased to within normal limits for patient tender doorknob and push pull heavy door with no increase in terms    Baseline Scar hypersensitive as well as increased burning pain with touch as well as to textures.  Increased 10/10.  Not using hands.  As well as favoring and guarding right arm    Time 8    Period Weeks    Status New    Target Date 12/30/22                   Plan - 11/18/22 1049     Clinical Impression Statement Patient presented at OT evaluation with a diagnosis of right dominant hand carpal tunnel release on 09/10/2022.  By Dr. [name].  Patient report with hypersensitivity and tenderness over scar with increased edema. At eval burning pain into the thumb and fourth of fifth digit constantly - worse with AROM 10/10 .  Patient limited in active range of motion for wrist extension/flexion and increased pain in other ranges.  Patient increased discomfort and pain with composite flexion of digits.  Increased burning pain and discomfort with thumb motion in all planes.  Pt in 5  session show great progress  in burning pain coming in 4/10 and  around scar - and with opposition of thumb and wrist flexion and ext end range. AROM in shoulder , elbow and digits increase to WNL. Pain decrease in session to 1/10 - with wrist ext and thumb opposition to base of 5th. Grip and prehension decrease greatly but mostly limited still by scar tissue and pain. Can tolerate more scar mobs using xtractor and mini massager over Cica scar pad.  Limited in functional use of right dominant hand and wrist in ADLs and IADLs.  Patient can benefit from skilled OT services to increase functional use of right dominant hand to prior level of function.    OT Occupational Profile and History Problem Focused Assessment - Including review of records relating to presenting problem    Occupational performance deficits (Please refer to evaluation for details): ADL's;IADL's;Rest and Sleep;Play;Social Participation;Leisure    Body Structure / Function / Physical Skills ADL;Edema;Dexterity;Decreased knowledge of precautions;Flexibility;ROM;UE functional use;Scar mobility;Sensation;Pain;Strength;IADL    Rehab Potential Good    Clinical Decision  Making Limited treatment options, no task modification necessary    Comorbidities Affecting Occupational Performance: May have comorbidities impacting occupational performance    Modification or Assistance to Complete Evaluation  No modification of tasks or assist necessary to complete eval    OT Frequency 2x / week    OT Duration 8 weeks    OT Treatment/Interventions Self-care/ADL training;Contrast Bath;Fluidtherapy;Paraffin;Therapeutic exercise;DME and/or AE instruction;Manual Therapy;Passive range of motion;Scar mobilization;Splinting;Patient/family education    Consulted and Agree with Plan of Care Patient             Patient will benefit from skilled therapeutic intervention in order to improve the following deficits and impairments:   Body Structure / Function /  Physical Skills: ADL, Edema, Dexterity, Decreased knowledge of precautions, Flexibility, ROM, UE functional use, Scar mobility, Sensation, Pain, Strength, IADL       Visit Diagnosis: Carpal tunnel syndrome, right upper limb  Pain in right hand  Scar condition and fibrosis of skin  Pain in right wrist  Muscle weakness (generalized)    Problem List Patient Active Problem List   Diagnosis Date Noted   Osteoarthritis of hips (Bilateral) 11/03/2022   Lumbar radiculitis (L5, S1) (Bilateral) (L>R) 11/03/2022   Lumbosacral radiculopathy/radiculitis at S1 (Bilateral) (L>R) 11/03/2022   Lumbosacral radiculopathy/radiculitis at L5 (Bilateral) (L>R) 11/03/2022   Abnormal drug screen (09/22/2022) 09/25/2022   Chronic hip pain (Bilateral) (R>L) 09/22/2022   Decreased range of motion of hips (Bilateral) (R>L) 09/22/2022   Chronic low back pain (2ry area of Pain) (Bilateral) (R>L) w/o sciatica 09/22/2022   Decreased range of motion of lumbar spine 09/22/2022   Generalized osteoarthritis of multiple sites 09/22/2022   History of marijuana use 09/22/2022   Heart murmur 09/21/2022   Myocardial infarction (HCC) 09/21/2022   Chronic pain syndrome 09/21/2022   Pharmacologic therapy 09/21/2022   Disorder of skeletal system 09/21/2022   Problems influencing health status 09/21/2022   Memory impairment 08/15/2022   Type 2 diabetes mellitus (HCC) 02/04/2022   Other insomnia 08/28/2019   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   Chronic obstructive pulmonary disease (HCC) 03/06/2019   Coronary artery disease involving native coronary artery of native heart without angina pectoris 03/06/2019   Major depressive disorder, recurrent, moderate (HCC) 03/06/2019   Malignant neoplasm of left female breast (HCC) 03/06/2019   Osteopenia 03/06/2019   Vitamin D insufficiency 03/06/2019   Hypothermia 12/03/2017   Chronic pain of lower extremity (1ry area of Pain) (Bilateral) 11/17/2017   Bilateral lower  extremity edema 11/17/2017   Moderate tobacco use disorder 11/17/2017   Hypertension 11/17/2017   Hypercholesterolemia 11/17/2017    Oletta Cohn, OTR/L,CLT 11/18/2022, 11:25 AM  [facility] [address] [place], [address] Phone: [phone]   Fax:  [phone]  Name: Tonya Keith MRN: [id number] Date of Birth: [birth year]

## 2022-11-20 ENCOUNTER — Ambulatory Visit: Payer: 59 | Admitting: Occupational Therapy

## 2022-11-20 DIAGNOSIS — M25531 Pain in right wrist: Secondary | ICD-10-CM

## 2022-11-20 DIAGNOSIS — G5601 Carpal tunnel syndrome, right upper limb: Secondary | ICD-10-CM | POA: Diagnosis not present

## 2022-11-20 DIAGNOSIS — M79641 Pain in right hand: Secondary | ICD-10-CM

## 2022-11-20 DIAGNOSIS — M6281 Muscle weakness (generalized): Secondary | ICD-10-CM

## 2022-11-20 DIAGNOSIS — L905 Scar conditions and fibrosis of skin: Secondary | ICD-10-CM

## 2022-11-20 NOTE — Therapy (Signed)
[facility] [address] [place], [place], [address] Phone: [phone]   Fax:  [phone]  Occupational Therapy Treatment  Patient Details  Name: Tonya Keith MRN: [id number] Date of Birth: [birth year] Referring Provider (OT): Dr [name]   Encounter Date: 11/20/2022   OT End of Session - 11/20/22 1032     Visit Number 6    Number of Visits 12    Date for OT Re-Evaluation 12/30/22    OT Start Time 1032    OT Stop Time 1112    OT Time Calculation (min) 40 min    Activity Tolerance Patient tolerated treatment well    Behavior During Therapy [facility] for tasks assessed/performed             Past Medical History:  Diagnosis Date   Anemia    Anginal pain (HCC)    Breast cancer (HCC) 2008   chemo mastectomy left   Cancer (HCC)    breast   COPD (chronic obstructive pulmonary disease) (HCC)    Coronary artery disease    History of left heart catheterization    w/ pci to RCA   Hx of migraine headaches    Hypertension    Myocardial infarction [facility]) 2010   Personal history of chemotherapy    Wears dentures    full upper (currently unable to wear)    Past Surgical History:  Procedure Laterality Date   ABDOMINAL HYSTERECTOMY     LEFT HEART CATH AND CORONARY ANGIOGRAPHY Left 09/07/2019   Procedure: LEFT HEART CATH AND CORONARY ANGIOGRAPHY;  Surgeon: Alwyn Pea, MD;  Location: [facility] INVASIVE CV LAB;  Service: Cardiovascular;  Laterality: Left;   LESION EXCISION WITH COMPLEX REPAIR Right 12/04/2020   Procedure: Excision right upper lip intraoral lesion;  Surgeon: Geanie Logan, MD;  Location: [facility];  Service: ENT;  Laterality: Right;  Latex   MASTECTOMY Left 2008   MINOR EXCISION OF ORAL LESION N/A 10/02/2020   Procedure: excision of right upper lip mass, intraoral;  Surgeon: Geanie Logan, MD;  Location: [facility];  Service: ENT;  Laterality: N/A;  Latex    There were no vitals filed for this  visit.   Subjective Assessment - 11/20/22 1032     Subjective  I don't know what I done yesterday - I was just sitting doing little word puzzle on my lap - and then my hand and arm started hurting all the way up to my shoulder    Pertinent History 10/24/22 Ortho NOTE DR [name]: Tonya Keith is a 62 y.o. female here today who is 5.5 weeks status post carpal tunnel release performed on 09/10/2022. At this time, she is having burning pain predominantly in the thumb, ring, and little fingers after having had prior carpal tunnel release.  The patient has been engaging in hand massages. She notes burning sensation from opposing her thumb. Refer to OT    Patient Stated Goals I want the pain and tenderness better in my R hand and wrist so I can use my hand to cook ,clean, get dress, bath and brush my teeth    Currently in Pain? Yes    Pain Score 6     Pain Location Arm    Pain Orientation Right    Pain Descriptors / Indicators Aching    Pain Type Surgical pain    Pain Onset More than a month ago    Pain Frequency Constant  Patient arrive with reports of increased pain yesterday and the whole right arm.  After doing some word searches in her lap.  Pain coming and 6/10 into the shoulder 2.     Heating pad over right shoulder while patient in fluidotherapy   OT Treatments/Exercises (OP) - 11/20/22 0001       RUE Paraffin   Number Minutes Paraffin 8 Minutes    RUE Paraffin Location Hand   wrist   Comments decreae pain -increase motion - prior to scar massage            Cont at home contrast  or heat prior to scar massage at home  pt can tolerate  scar mobilization  much better  Burning pain much better patient tender spot at distal scar as well as radial scar. Pain after fluidotherapy and soft tissue decrease to 1/10 in wrist and hand No pain in forearm to shoulder  Scar massage done with mini massager over Cica -Care scar pad as well as manually by OT on Cica -Care  scar pad and without it Scar mobs with opposition of thumb to base of 5th .  MC spreads , CT spreads and webspace done Cont  rolling over red foam roller for soft tissue on volar palm ,and digits - forearm  2 min  Cica -Care scar pad for nighttime with tubi grip    Tendon glides pain-free 10 reps This date done individually digit flexion facilitating tendon glides As well as patient doing different signs with fingers to encourage individual tendon glides  Active range of motion for thumb palmar /radial abduction 10 reps Opposition to all digits  - sliding down 4th and 5th 10 reps- keep motion - do not force Active range of motion for wrist flexion and extension 10 reps Table slide review for patient to do as well as composite flexor stretch out to the side if writing doing word puzzles.   She can continue if no pain with 1 pound weight for shoulder punches as well as elbow flexion extension, supination and pronation and radial ulnar deviation; and add wrist flexion, ext this date  2 sets of 12  1 x a day Pain-free incision Provided patient with a built-up grip for her pen to use at home.          OT Education - 11/20/22 1032     Education Details progress and changes in HEP    Person(s) Educated Patient    Methods Explanation;Demonstration;Tactile cues;Verbal cues;Handout    Comprehension Verbal cues required;Returned demonstration;Verbalized understanding                 OT Long Term Goals - 11/04/22 1626       OT LONG TERM GOAL #1   Title Patient to be independent in home program to decrease hypersensitivity and tenderness over the scar to be able to tolerate drying with a towel symptom-free    Baseline 10/10 tenderness with soft massage, soft textures and rougher textures.    Time 3    Period Weeks    Status New    Target Date 11/25/22      OT LONG TERM GOAL #2   Title R Thumb active range of motion increased to within normal limits for palmar /radial  abduction to pick up  half a cup and opposition to within normal limits to do buttons symptom-free    Baseline Opposition to third digit.  Discomfort and hold to his fourth.  It burning pain increased  as well as increased burn and pain in thumb with palmar radial abduction at 48 to 55 degrees    Time 4    Period Weeks    Status New    Target Date 12/02/22      OT LONG TERM GOAL #3   Title Right wrist active range of motion increased to within normal limits and symptom-free for patient to initiate using right hand in bathing and dressing symptom-free    Baseline Right wrist extension 35.  Flexion and radial ulnar deviation within normal limits for motion but discomfort and burning pain 10/10 over scar    Time 5    Period Weeks    Status New    Target Date 12/09/22      OT LONG TERM GOAL #4   Title Right grip and prehension strength increased to more than 50% compared to the left for patient to use hand in bathing and dressing with no increase symptoms    Baseline Patient not using right hand increased burning pain as well as hypersensitivity and tenderness over the scar 10/10 discomfort in    Time 8    Period Weeks    Status New    Target Date 12/30/22      OT LONG TERM GOAL #5   Title R Wrist and forearm range motion and strength increased to within normal limits for patient tender doorknob and push pull heavy door with no increase in terms    Baseline Scar hypersensitive as well as increased burning pain with touch as well as to textures.  Increased 10/10.  Not using hands.  As well as favoring and guarding right arm    Time 8    Period Weeks    Status New    Target Date 12/30/22                   Plan - 11/20/22 1032     Clinical Impression Statement Patient presented at OT evaluation with a diagnosis of right dominant hand carpal tunnel release on 09/10/2022.  By Dr. [name].  Patient report with hypersensitivity and tenderness over scar with increased edema. At eval  burning pain into the thumb and fourth of fifth digit constantly - worse with AROM 10/10 .  Patient limited in active range of motion for wrist extension/flexion and increased pain in other ranges.  Patient increased discomfort and pain with composite flexion of digits.  Increased burning pain and discomfort with thumb motion in all planes.  Pt in 6 session show great progress- did arrive with increase pain in R UE since yesterday - but appear pt was doing word search in on lap. Provided her with larger grip for pain -and pt to take breaks and do composite extention stretch out to side every few minutes. In session pain decrease to 1/10 - still tender over radial and distal scar 3/10. Add individual digits tendon glides for HEP.  Cont to be limited in functional use of right dominant hand and wrist in ADLs and IADLs.  Patient can benefit from skilled OT services to increase functional use of right dominant hand to prior level of function.    OT Occupational Profile and History Problem Focused Assessment - Including review of records relating to presenting problem    Occupational performance deficits (Please refer to evaluation for details): ADL's;IADL's;Rest and Sleep;Play;Social Participation;Leisure    Body Structure / Function / Physical Skills ADL;Edema;Dexterity;Decreased knowledge of precautions;Flexibility;ROM;UE functional use;Scar mobility;Sensation;Pain;Strength;IADL    Rehab Potential Good  Clinical Decision Making Limited treatment options, no task modification necessary    Comorbidities Affecting Occupational Performance: May have comorbidities impacting occupational performance    Modification or Assistance to Complete Evaluation  No modification of tasks or assist necessary to complete eval    OT Frequency 2x / week    OT Duration 8 weeks    OT Treatment/Interventions Self-care/ADL training;Contrast Bath;Fluidtherapy;Paraffin;Therapeutic exercise;DME and/or AE instruction;Manual  Therapy;Passive range of motion;Scar mobilization;Splinting;Patient/family education    Consulted and Agree with Plan of Care Patient             Patient will benefit from skilled therapeutic intervention in order to improve the following deficits and impairments:   Body Structure / Function / Physical Skills: ADL, Edema, Dexterity, Decreased knowledge of precautions, Flexibility, ROM, UE functional use, Scar mobility, Sensation, Pain, Strength, IADL       Visit Diagnosis: Carpal tunnel syndrome, right upper limb  Pain in right hand  Scar condition and fibrosis of skin  Pain in right wrist  Muscle weakness (generalized)    Problem List Patient Active Problem List   Diagnosis Date Noted   Osteoarthritis of hips (Bilateral) 11/03/2022   Lumbar radiculitis (L5, S1) (Bilateral) (L>R) 11/03/2022   Lumbosacral radiculopathy/radiculitis at S1 (Bilateral) (L>R) 11/03/2022   Lumbosacral radiculopathy/radiculitis at L5 (Bilateral) (L>R) 11/03/2022   Abnormal drug screen (09/22/2022) 09/25/2022   Chronic hip pain (Bilateral) (R>L) 09/22/2022   Decreased range of motion of hips (Bilateral) (R>L) 09/22/2022   Chronic low back pain (2ry area of Pain) (Bilateral) (R>L) w/o sciatica 09/22/2022   Decreased range of motion of lumbar spine 09/22/2022   Generalized osteoarthritis of multiple sites 09/22/2022   History of marijuana use 09/22/2022   Heart murmur 09/21/2022   Myocardial infarction (HCC) 09/21/2022   Chronic pain syndrome 09/21/2022   Pharmacologic therapy 09/21/2022   Disorder of skeletal system 09/21/2022   Problems influencing health status 09/21/2022   Memory impairment 08/15/2022   Type 2 diabetes mellitus (HCC) 02/04/2022   Other insomnia 08/28/2019   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   Chronic obstructive pulmonary disease (HCC) 03/06/2019   Coronary artery disease involving native coronary artery of native heart without angina pectoris 03/06/2019   Major  depressive disorder, recurrent, moderate (HCC) 03/06/2019   Malignant neoplasm of left female breast (HCC) 03/06/2019   Osteopenia 03/06/2019   Vitamin D insufficiency 03/06/2019   Hypothermia 12/03/2017   Chronic pain of lower extremity (1ry area of Pain) (Bilateral) 11/17/2017   Bilateral lower extremity edema 11/17/2017   Moderate tobacco use disorder 11/17/2017   Hypertension 11/17/2017   Hypercholesterolemia 11/17/2017    Oletta Cohn, OTR/L,CLT 11/20/2022, 1:15 PM  [facility] [address], [place], [address] Phone: [phone]   Fax:  [phone]  Name: Tonya Keith MRN: [id number] Date of Birth: 1961/04/19

## 2022-11-24 ENCOUNTER — Ambulatory Visit: Payer: 59 | Attending: Orthopedic Surgery | Admitting: Occupational Therapy

## 2022-11-24 DIAGNOSIS — G5601 Carpal tunnel syndrome, right upper limb: Secondary | ICD-10-CM | POA: Insufficient documentation

## 2022-11-24 DIAGNOSIS — M6281 Muscle weakness (generalized): Secondary | ICD-10-CM | POA: Diagnosis present

## 2022-11-24 DIAGNOSIS — M79641 Pain in right hand: Secondary | ICD-10-CM | POA: Diagnosis present

## 2022-11-24 DIAGNOSIS — L905 Scar conditions and fibrosis of skin: Secondary | ICD-10-CM | POA: Diagnosis present

## 2022-11-24 DIAGNOSIS — M25531 Pain in right wrist: Secondary | ICD-10-CM | POA: Insufficient documentation

## 2022-11-28 ENCOUNTER — Encounter: Payer: Self-pay | Admitting: Occupational Therapy

## 2022-11-28 NOTE — Therapy (Addendum)
[facility] [address] [place], [place], [address] Phone: [phone]   Fax:  [phone]  Occupational Therapy Treatment  Patient Details  Name: Tonya Keith MRN: [id number] Date of Birth: 07/16/1960 Referring Provider (OT): Dr [name]   Encounter Date: 11/24/2022   OT End of Session - 11/28/22 1135     Visit Number 7    Number of Visits 12    Date for OT Re-Evaluation 12/30/22    OT Start Time 1030    OT Stop Time 1120    OT Time Calculation (min) 50 min    Activity Tolerance Patient tolerated treatment well    Behavior During Therapy [facility] for tasks assessed/performed             Past Medical History:  Diagnosis Date   Anemia    Anginal pain (HCC)    Breast cancer (HCC) 2008   chemo mastectomy left   Cancer (HCC)    breast   COPD (chronic obstructive pulmonary disease) (HCC)    Coronary artery disease    History of left heart catheterization    w/ pci to RCA   Hx of migraine headaches    Hypertension    Myocardial infarction [facility]) 2010   Personal history of chemotherapy    Wears dentures    full upper (currently unable to wear)    Past Surgical History:  Procedure Laterality Date   ABDOMINAL HYSTERECTOMY     LEFT HEART CATH AND CORONARY ANGIOGRAPHY Left 09/07/2019   Procedure: LEFT HEART CATH AND CORONARY ANGIOGRAPHY;  Surgeon: Alwyn Pea, MD;  Location: [facility] INVASIVE CV LAB;  Service: Cardiovascular;  Laterality: Left;   LESION EXCISION WITH COMPLEX REPAIR Right 12/04/2020   Procedure: Excision right upper lip intraoral lesion;  Surgeon: Geanie Logan, MD;  Location: [facility];  Service: ENT;  Laterality: Right;  Latex   MASTECTOMY Left 2008   MINOR EXCISION OF ORAL LESION N/A 10/02/2020   Procedure: excision of right upper lip mass, intraoral;  Surgeon: Geanie Logan, MD;  Location: [facility];  Service: ENT;  Laterality: N/A;  Latex    There were no vitals filed for this  visit.   Subjective Assessment - 11/28/22 1133     Subjective  No pain today, some itching.  Stiffness in index and middle fingers.  Burning not bad, much better over time.  "I got my appointment time mixed up, I thought it was earlier."  Still doing contrast, exercises at home frequently during the day.    Pertinent History 10/24/22 Ortho NOTE DR [name]: Tonya Keith is a 62 y.o. female here today who is 5.5 weeks status post carpal tunnel release performed on 09/10/2022. At this time, she is having burning pain predominantly in the thumb, ring, and little fingers after having had prior carpal tunnel release.  The patient has been engaging in hand massages. She notes burning sensation from opposing her thumb. Refer to OT    Patient Stated Goals I want the pain and tenderness better in my R hand and wrist so I can use my hand to cook ,clean, get dress, bath and brush my teeth    Currently in Pain? No/denies    Pain Score 0-No pain    Pain Onset More than a month ago            Denies any pain this date, reports she has been doing word puzzles for a short amount of time and  built up pen grip.  Fluidotherapy:  Pt seen for use of fluidotherapy for 10 mins to decrease edema, pain and increase ROM.  Pt performing ROM exercises while in fluido for wrist and hand.    Paraffin to right hand for 8 mins to increase active ROM, decrease pain.  Manual Therapy: Following paraffin, pt seen for scar massage to wrist, use of extractor on scar.  Scar mobs with opposition of thumb to base of 5th . MC spreads , CT spreads and webspace done red foam roller for soft tissue on volar palm ,and digits - forearm  Cica -Care scar pad for nighttime with tubi grip    Tendon glides pain-free 10 reps This date done individually digit flexion facilitating tendon glides As well as patient doing different signs with fingers to encourage individual tendon glides   Therapeutic Exercise: Active range of motion for thumb  palmar /radial abduction 10 reps Opposition to all digits  - sliding down 4th and 5th 10 reps- keep motion - do not force Active range of motion for wrist flexion and extension 10 reps Table slide review for patient to do as well as composite flexor stretch out to the side if writing doing word puzzles.  1 pound weight for shoulder punches as well as elbow flexion extension, supination and pronation and radial ulnar deviation; and add wrist flexion, ext this date  2 sets of 12                     OT Education - 11/28/22 1135     Education Details progress and changes in HEP    Person(s) Educated Patient    Methods Explanation;Demonstration;Tactile cues;Verbal cues;Handout    Comprehension Verbal cues required;Returned demonstration;Verbalized understanding                 OT Long Term Goals - 11/04/22 1626       OT LONG TERM GOAL #1   Title Patient to be independent in home program to decrease hypersensitivity and tenderness over the scar to be able to tolerate drying with a towel symptom-free    Baseline 10/10 tenderness with soft massage, soft textures and rougher textures.    Time 3    Period Weeks    Status New    Target Date 11/25/22      OT LONG TERM GOAL #2   Title R Thumb active range of motion increased to within normal limits for palmar /radial abduction to pick up  half a cup and opposition to within normal limits to do buttons symptom-free    Baseline Opposition to third digit.  Discomfort and hold to his fourth.  It burning pain increased as well as increased burn and pain in thumb with palmar radial abduction at 48 to 55 degrees    Time 4    Period Weeks    Status New    Target Date 12/02/22      OT LONG TERM GOAL #3   Title Right wrist active range of motion increased to within normal limits and symptom-free for patient to initiate using right hand in bathing and dressing symptom-free    Baseline Right wrist extension 35.  Flexion and radial  ulnar deviation within normal limits for motion but discomfort and burning pain 10/10 over scar    Time 5    Period Weeks    Status New    Target Date 12/09/22      OT LONG TERM GOAL #4  Title Right grip and prehension strength increased to more than 50% compared to the left for patient to use hand in bathing and dressing with no increase symptoms    Baseline Patient not using right hand increased burning pain as well as hypersensitivity and tenderness over the scar 10/10 discomfort in    Time 8    Period Weeks    Status New    Target Date 12/30/22      OT LONG TERM GOAL #5   Title R Wrist and forearm range motion and strength increased to within normal limits for patient tender doorknob and push pull heavy door with no increase in terms    Baseline Scar hypersensitive as well as increased burning pain with touch as well as to textures.  Increased 10/10.  Not using hands.  As well as favoring and guarding right arm    Time 8    Period Weeks    Status New    Target Date 12/30/22                   Plan - 11/28/22 1135     Clinical Impression Statement Patient presented at OT evaluation with a diagnosis of right dominant hand carpal tunnel release on 09/10/2022.  By Dr. [name].  Patient report with hypersensitivity and tenderness over scar with increased edema. At eval burning pain into the thumb and fourth of fifth digit constantly - worse with AROM 10/10 .  Patient limited in active range of motion for wrist extension/flexion and increased pain in other ranges.  Patient increased discomfort and pain with composite flexion of digits.  Increased burning pain and discomfort with thumb motion in all planes. This date, pt arrived and denied any pain and reports she is doing better.  She has been using built up pen grip and doing word puzzles for shorter lengths of time.  ROM improved and performing scar massage at home and reports decreased burning in palm, just a little in the area of the  scar.    Cont to be limited in functional use of right dominant hand and wrist in ADLs and IADLs.  Patient can benefit from skilled OT services to increase functional use of right dominant hand to prior level of function.    OT Occupational Profile and History Problem Focused Assessment - Including review of records relating to presenting problem    Occupational performance deficits (Please refer to evaluation for details): ADL's;IADL's;Rest and Sleep;Play;Social Participation;Leisure    Body Structure / Function / Physical Skills ADL;Edema;Dexterity;Decreased knowledge of precautions;Flexibility;ROM;UE functional use;Scar mobility;Sensation;Pain;Strength;IADL    Rehab Potential Good    Clinical Decision Making Limited treatment options, no task modification necessary    Comorbidities Affecting Occupational Performance: May have comorbidities impacting occupational performance    Modification or Assistance to Complete Evaluation  No modification of tasks or assist necessary to complete eval    OT Frequency 2x / week    OT Duration 8 weeks    OT Treatment/Interventions Self-care/ADL training;Contrast Bath;Fluidtherapy;Paraffin;Therapeutic exercise;DME and/or AE instruction;Manual Therapy;Passive range of motion;Scar mobilization;Splinting;Patient/family education    Consulted and Agree with Plan of Care Patient             Patient will benefit from skilled therapeutic intervention in order to improve the following deficits and impairments:   Body Structure / Function / Physical Skills: ADL, Edema, Dexterity, Decreased knowledge of precautions, Flexibility, ROM, UE functional use, Scar mobility, Sensation, Pain, Strength, IADL       Visit Diagnosis:  Carpal tunnel syndrome, right upper limb  Pain in right hand  Scar condition and fibrosis of skin  Pain in right wrist    Problem List Patient Active Problem List   Diagnosis Date Noted   Osteoarthritis of hips (Bilateral) 11/03/2022    Lumbar radiculitis (L5, S1) (Bilateral) (L>R) 11/03/2022   Lumbosacral radiculopathy/radiculitis at S1 (Bilateral) (L>R) 11/03/2022   Lumbosacral radiculopathy/radiculitis at L5 (Bilateral) (L>R) 11/03/2022   Abnormal drug screen (09/22/2022) 09/25/2022   Chronic hip pain (Bilateral) (R>L) 09/22/2022   Decreased range of motion of hips (Bilateral) (R>L) 09/22/2022   Chronic low back pain (2ry area of Pain) (Bilateral) (R>L) w/o sciatica 09/22/2022   Decreased range of motion of lumbar spine 09/22/2022   Generalized osteoarthritis of multiple sites 09/22/2022   History of marijuana use 09/22/2022   Heart murmur 09/21/2022   Myocardial infarction (HCC) 09/21/2022   Chronic pain syndrome 09/21/2022   Pharmacologic therapy 09/21/2022   Disorder of skeletal system 09/21/2022   Problems influencing health status 09/21/2022   Memory impairment 08/15/2022   Type 2 diabetes mellitus (HCC) 02/04/2022   Other insomnia 08/28/2019   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   Chronic obstructive pulmonary disease (HCC) 03/06/2019   Coronary artery disease involving native coronary artery of native heart without angina pectoris 03/06/2019   Major depressive disorder, recurrent, moderate (HCC) 03/06/2019   Malignant neoplasm of left female breast (HCC) 03/06/2019   Osteopenia 03/06/2019   Vitamin D insufficiency 03/06/2019   Hypothermia 12/03/2017   Chronic pain of lower extremity (1ry area of Pain) (Bilateral) 11/17/2017   Bilateral lower extremity edema 11/17/2017   Moderate tobacco use disorder 11/17/2017   Hypertension 11/17/2017   Hypercholesterolemia 11/17/2017   Tonya Keith T Tonya Keith, OTR/L, CLT  Oather Muilenburg, OT 11/28/2022, 11:49 AM  [facility] [address], [place], [address] Phone: [phone]   Fax:  [phone]  Name: MAHINA PETRUSKA MRN: [id number] Date of Birth: 1960-11-01

## 2022-12-02 ENCOUNTER — Ambulatory Visit: Payer: 59 | Admitting: Occupational Therapy

## 2022-12-04 ENCOUNTER — Ambulatory Visit: Payer: 59 | Admitting: Occupational Therapy

## 2022-12-09 ENCOUNTER — Ambulatory Visit: Payer: 59 | Admitting: Occupational Therapy

## 2022-12-09 DIAGNOSIS — M25531 Pain in right wrist: Secondary | ICD-10-CM

## 2022-12-09 DIAGNOSIS — M6281 Muscle weakness (generalized): Secondary | ICD-10-CM

## 2022-12-09 DIAGNOSIS — M79641 Pain in right hand: Secondary | ICD-10-CM

## 2022-12-09 DIAGNOSIS — G5601 Carpal tunnel syndrome, right upper limb: Secondary | ICD-10-CM

## 2022-12-09 DIAGNOSIS — L905 Scar conditions and fibrosis of skin: Secondary | ICD-10-CM

## 2022-12-11 ENCOUNTER — Ambulatory Visit: Payer: 59 | Admitting: Occupational Therapy

## 2022-12-11 ENCOUNTER — Encounter: Payer: Self-pay | Admitting: Occupational Therapy

## 2022-12-11 DIAGNOSIS — G5601 Carpal tunnel syndrome, right upper limb: Secondary | ICD-10-CM

## 2022-12-11 DIAGNOSIS — L905 Scar conditions and fibrosis of skin: Secondary | ICD-10-CM

## 2022-12-11 DIAGNOSIS — M6281 Muscle weakness (generalized): Secondary | ICD-10-CM

## 2022-12-11 DIAGNOSIS — M79641 Pain in right hand: Secondary | ICD-10-CM

## 2022-12-11 DIAGNOSIS — M25531 Pain in right wrist: Secondary | ICD-10-CM

## 2022-12-11 NOTE — Therapy (Signed)
[facility] [address] [place], [place], [address] Phone: [phone]   Fax:  [phone]  Occupational Therapy Treatment  Patient Details  Name: Tonya Keith MRN: [id number] Date of Birth: 09-10-1960 Referring Provider (OT): Dr [name]   Encounter Date: 12/11/2022   OT End of Session - 12/11/22 1316     Visit Number 9    Number of Visits 12    Date for OT Re-Evaluation 12/30/22    OT Start Time 1315    OT Stop Time 1357    OT Time Calculation (min) 42 min    Activity Tolerance Patient tolerated treatment well    Behavior During Therapy [facility] for tasks assessed/performed             Past Medical History:  Diagnosis Date   Anemia    Anginal pain (HCC)    Breast cancer (HCC) 2008   chemo mastectomy left   Cancer (HCC)    breast   COPD (chronic obstructive pulmonary disease) (HCC)    Coronary artery disease    History of left heart catheterization    w/ pci to RCA   Hx of migraine headaches    Hypertension    Myocardial infarction [facility] [place]) 2010   Personal history of chemotherapy    Wears dentures    full upper (currently unable to wear)    Past Surgical History:  Procedure Laterality Date   ABDOMINAL HYSTERECTOMY     LEFT HEART CATH AND CORONARY ANGIOGRAPHY Left 09/07/2019   Procedure: LEFT HEART CATH AND CORONARY ANGIOGRAPHY;  Surgeon: Alwyn Pea, MD;  Location: [facility] INVASIVE CV LAB;  Service: Cardiovascular;  Laterality: Left;   LESION EXCISION WITH COMPLEX REPAIR Right 12/04/2020   Procedure: Excision right upper lip intraoral lesion;  Surgeon: Geanie Logan, MD;  Location: [facility];  Service: ENT;  Laterality: Right;  Latex   MASTECTOMY Left 2008   MINOR EXCISION OF ORAL LESION N/A 10/02/2020   Procedure: excision of right upper lip mass, intraoral;  Surgeon: Geanie Logan, MD;  Location: [facility];  Service: ENT;  Laterality: N/A;  Latex    There were no vitals filed for this  visit.   Subjective Assessment - 12/11/22 1316     Subjective  Seen the Dr -said my hand is so much better- just one area of burning pain at times- and then pinkie is tender    Pertinent History 10/24/22 Ortho NOTE DR [name]: Tonya Keith is a 62 y.o. female here today who is 5.5 weeks status post carpal tunnel release performed on 09/10/2022. At this time, she is having burning pain predominantly in the thumb, ring, and little fingers after having had prior carpal tunnel release.  The patient has been engaging in hand massages. She notes burning sensation from opposing her thumb. Refer to OT    Patient Stated Goals I want the pain and tenderness better in my R hand and wrist so I can use my hand to cook ,clean, get dress, bath and brush my teeth    Currently in Pain? Yes    Pain Score 2     Pain Location --   MC of 5th digit   Pain Orientation Right    Pain Descriptors / Indicators Tender    Pain Type Surgical pain                [facility] OT Assessment - 12/11/22 0001       Strength  Right Hand Grip (lbs) 10   30 with CMC neoprene   Right Hand Lateral Pinch 5 lbs   12 with CMC neoprene   Right Hand 3 Point Pinch 6 lbs   10 lbs with CMC neoprene            Pt arrive with small area of burning pain radial of scar - pain and tenderness over thumb CMC and positive grinding test Pt show increase grip and prehension  But with CMC neoprene splint when fitted - had great increase in grip and prehension strength with less pain Pt was able to carry and lift 4 lbs , push and pull heavy door , turn doorknob Done also 1lbs wrist , elbow and shoulder HEP - 12 reps discomfort only on ulnar side of hand - less than 2/10  No symptoms at CT scar or thumb Pt to use during day with functional tasks          OT Treatments/Exercises (OP) - 12/11/22 0001       RUE Paraffin   Number Minutes Paraffin 8 Minutes    RUE Paraffin Location Hand    Comments decrease pain prior ot scar massage             scar massage done -using graston tool nr 2 for brushing over scar and around -  Less pain and no burning after sessions Review tendon glides, thumb PA and RA And opposition  No pain end of session Pt to cont with contrast , scar massage and ROM  As well as 1 lbs weight  Follow up in 10 days         OT Education - 12/11/22 1316     Education Details progress and changes in HEP    Person(s) Educated Patient    Methods Explanation;Demonstration;Tactile cues;Verbal cues;Handout    Comprehension Verbal cues required;Returned demonstration;Verbalized understanding                 OT Long Term Goals - 11/04/22 1626       OT LONG TERM GOAL #1   Title Patient to be independent in home program to decrease hypersensitivity and tenderness over the scar to be able to tolerate drying with a towel symptom-free    Baseline 10/10 tenderness with soft massage, soft textures and rougher textures.    Time 3    Period Weeks    Status New    Target Date 11/25/22      OT LONG TERM GOAL #2   Title R Thumb active range of motion increased to within normal limits for palmar /radial abduction to pick up  half a cup and opposition to within normal limits to do buttons symptom-free    Baseline Opposition to third digit.  Discomfort and hold to his fourth.  It burning pain increased as well as increased burn and pain in thumb with palmar radial abduction at 48 to 55 degrees    Time 4    Period Weeks    Status New    Target Date 12/02/22      OT LONG TERM GOAL #3   Title Right wrist active range of motion increased to within normal limits and symptom-free for patient to initiate using right hand in bathing and dressing symptom-free    Baseline Right wrist extension 35.  Flexion and radial ulnar deviation within normal limits for motion but discomfort and burning pain 10/10 over scar    Time 5    Period Weeks  Status New    Target Date 12/09/22      OT LONG TERM GOAL #4    Title Right grip and prehension strength increased to more than 50% compared to the left for patient to use hand in bathing and dressing with no increase symptoms    Baseline Patient not using right hand increased burning pain as well as hypersensitivity and tenderness over the scar 10/10 discomfort in    Time 8    Period Weeks    Status New    Target Date 12/30/22      OT LONG TERM GOAL #5   Title R Wrist and forearm range motion and strength increased to within normal limits for patient tender doorknob and push pull heavy door with no increase in terms    Baseline Scar hypersensitive as well as increased burning pain with touch as well as to textures.  Increased 10/10.  Not using hands.  As well as favoring and guarding right arm    Time 8    Period Weeks    Status New    Target Date 12/30/22                   Plan - 12/11/22 1316     Clinical Impression Statement Patient presented at OT evaluation with a diagnosis of right dominant hand carpal tunnel release on 09/10/2022.  By Dr. [name]. Pt made great progress in edema , scar tissue , pain and ROM - only small area of burning pain radially to scar . Pt do report increase tenderness over ulnar base of 5th and MC of 5th. 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 Upon fitting pt with CMC neoprene wrap - pt grip increase from 10 lbs to 30 and lat and 3 point pinch improve greatly with less pain. Pt able to use hand in clinic pushing, pulling , carrying and lifting with less pain . Pt to use during day CMC neoprene - and increase functional use and strength. Cont with contrast and scar massage - follow up in 10 days.  She has been using built up pen grip and doing word puzzles for shorter lengths of time and still doing well with this leisure task.  ROM ocntinues to improve and performing scar massage at home and reports decreased burning in palm. Cont to be limited in  functional use of right dominant hand and wrist in ADLs and IADLs.  Patient can benefit from skilled OT services to increase functional use of right dominant hand to prior level of function.    OT Occupational Profile and History Problem Focused Assessment - Including review of records relating to presenting problem    Occupational performance deficits (Please refer to evaluation for details): ADL's;IADL's;Rest and Sleep;Play;Social Participation;Leisure    Body Structure / Function / Physical Skills ADL;Edema;Dexterity;Decreased knowledge of precautions;Flexibility;ROM;UE functional use;Scar mobility;Sensation;Pain;Strength;IADL    Rehab Potential Good    Clinical Decision Making Limited treatment options, no task modification necessary    Comorbidities Affecting Occupational Performance: May have comorbidities impacting occupational performance    Modification or Assistance to Complete Evaluation  No modification of tasks or assist necessary to complete eval    OT Frequency 2x / week    OT Duration 8 weeks    OT Treatment/Interventions Self-care/ADL training;Contrast Bath;Fluidtherapy;Paraffin;Therapeutic exercise;DME and/or AE instruction;Manual Therapy;Passive range of motion;Scar mobilization;Splinting;Patient/family education    Consulted and Agree with Plan of Care Patient  Patient will benefit from skilled therapeutic intervention in order to improve the following deficits and impairments:   Body Structure / Function / Physical Skills: ADL, Edema, Dexterity, Decreased knowledge of precautions, Flexibility, ROM, UE functional use, Scar mobility, Sensation, Pain, Strength, IADL       Visit Diagnosis: Carpal tunnel syndrome, right upper limb  Scar condition and fibrosis of skin  Pain in right wrist  Muscle weakness (generalized)  Pain in right hand    Problem List Patient Active Problem List   Diagnosis Date Noted   Osteoarthritis of hips (Bilateral)  11/03/2022   Lumbar radiculitis (L5, S1) (Bilateral) (L>R) 11/03/2022   Lumbosacral radiculopathy/radiculitis at S1 (Bilateral) (L>R) 11/03/2022   Lumbosacral radiculopathy/radiculitis at L5 (Bilateral) (L>R) 11/03/2022   Abnormal drug screen (09/22/2022) 09/25/2022   Chronic hip pain (Bilateral) (R>L) 09/22/2022   Decreased range of motion of hips (Bilateral) (R>L) 09/22/2022   Chronic low back pain (2ry area of Pain) (Bilateral) (R>L) w/o sciatica 09/22/2022   Decreased range of motion of lumbar spine 09/22/2022   Generalized osteoarthritis of multiple sites 09/22/2022   History of marijuana use 09/22/2022   Heart murmur 09/21/2022   Myocardial infarction (HCC) 09/21/2022   Chronic pain syndrome 09/21/2022   Pharmacologic therapy 09/21/2022   Disorder of skeletal system 09/21/2022   Problems influencing health status 09/21/2022   Memory impairment 08/15/2022   Type 2 diabetes mellitus (HCC) 02/04/2022   Other insomnia 08/28/2019   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   Chronic obstructive pulmonary disease (HCC) 03/06/2019   Coronary artery disease involving native coronary artery of native heart without angina pectoris 03/06/2019   Major depressive disorder, recurrent, moderate (HCC) 03/06/2019   Malignant neoplasm of left female breast (HCC) 03/06/2019   Osteopenia 03/06/2019   Vitamin D insufficiency 03/06/2019   Hypothermia 12/03/2017   Chronic pain of lower extremity (1ry area of Pain) (Bilateral) 11/17/2017   Bilateral lower extremity edema 11/17/2017   Moderate tobacco use disorder 11/17/2017   Hypertension 11/17/2017   Hypercholesterolemia 11/17/2017    Oletta Cohn, OTR/L,CLT 12/11/2022, 2:05 PM  [facility] [address], [place], [address] Phone: [phone]   Fax:  [phone]  Name: Tonya Keith MRN: [id number] Date of Birth: 1960/12/09

## 2022-12-11 NOTE — Therapy (Signed)
[facility] [address] [place], [place], [address] Phone: [phone]   Fax:  [phone]  Occupational Therapy Treatment  Patient Details  Name: BRETTA FEES MRN: [id number] Date of Birth: [birth year] Referring Provider (OT): Dr [name]   Encounter Date: 12/09/2022   OT End of Session - 12/11/22 1138     Visit Number 8    Number of Visits 12    Date for OT Re-Evaluation 12/30/22    OT Start Time 1029    OT Stop Time 1115    OT Time Calculation (min) 46 min    Activity Tolerance Patient tolerated treatment well    Behavior During Therapy [facility] for tasks assessed/performed             Past Medical History:  Diagnosis Date   Anemia    Anginal pain (HCC)    Breast cancer (HCC) 2008   chemo mastectomy left   Cancer (HCC)    breast   COPD (chronic obstructive pulmonary disease) (HCC)    Coronary artery disease    History of left heart catheterization    w/ pci to RCA   Hx of migraine headaches    Hypertension    Myocardial infarction [facility]) 2010   Personal history of chemotherapy    Wears dentures    full upper (currently unable to wear)    Past Surgical History:  Procedure Laterality Date   ABDOMINAL HYSTERECTOMY     LEFT HEART CATH AND CORONARY ANGIOGRAPHY Left 09/07/2019   Procedure: LEFT HEART CATH AND CORONARY ANGIOGRAPHY;  Surgeon: Alwyn Pea, MD;  Location: [facility] INVASIVE CV LAB;  Service: Cardiovascular;  Laterality: Left;   LESION EXCISION WITH COMPLEX REPAIR Right 12/04/2020   Procedure: Excision right upper lip intraoral lesion;  Surgeon: Geanie Logan, MD;  Location: [facility];  Service: ENT;  Laterality: Right;  Latex   MASTECTOMY Left 2008   MINOR EXCISION OF ORAL LESION N/A 10/02/2020   Procedure: excision of right upper lip mass, intraoral;  Surgeon: Geanie Logan, MD;  Location: [facility];  Service: ENT;  Laterality: N/A;  Latex    There were no vitals filed for this  visit.   Subjective Assessment - 12/10/22 1136     Subjective  Pt reports she missed therapy last week due to being sick.  Feeling better now and has still been working on exercises and contrast at home.  Pt denies any pain, went to the doctor yesterday and was placed on Gabapentin.  Still has some numbness and tingling in fingers (middle and ring).  "Sometimes it seems like that little finger wants to get stuck".    Pertinent History 10/24/22 Ortho NOTE DR [name]: Sharnee Douglass is a 62 y.o. female here today who is 5.5 weeks status post carpal tunnel release performed on 09/10/2022. At this time, she is having burning pain predominantly in the thumb, ring, and little fingers after having had prior carpal tunnel release.  The patient has been engaging in hand massages. She notes burning sensation from opposing her thumb. Refer to OT    Patient Stated Goals I want the pain and tenderness better in my R hand and wrist so I can use my hand to cook ,clean, get dress, bath and brush my teeth    Currently in Pain? No/denies    Pain Score 0-No pain    Pain Onset More than a month ago  Fluidotherapy:  Pt seen for use of fluidotherapy for 10 mins to decrease edema, pain and increase ROM.  Pt performing ROM exercises while in fluido for wrist and hand.     Manual Therapy: Following paraffin, pt seen for scar massage to wrist, use of extractor on scar.  Scar mobs with opposition of thumb to base of 5th . MC spreads , CT spreads and webspace performed Use of red foam roller for soft tissue on volar palm,and digits - forearm, rolling these areas to provide scar massage and promote increased tissue mobility    Therapeutic Exercise: Tendon glides pain-free 10 reps Active range of motion for thumb palmar /radial abduction 10 reps Opposition to all digits  - sliding down 4th and 5th 10 reps- keep motion - do not force Active range of motion for wrist flexion and extension 10 reps Table slides,  composite flexor stretch out to the side if writing doing word puzzles.  1 pound weight for shoulder punches as well as elbow flexion extension, supination and pronation and radial ulnar deviation; and add wrist flexion, ext, increased to 3 sets of 12   Pt to continue with use of [employer] -Care scar pad for nighttime with tubi grip      OT Education - 12/11/22 1138     Education Details progress and changes in HEP    Person(s) Educated Patient    Methods Explanation;Demonstration;Tactile cues;Verbal cues;Handout    Comprehension Verbal cues required;Returned demonstration;Verbalized understanding                 OT Long Term Goals - 11/04/22 1626       OT LONG TERM GOAL #1   Title Patient to be independent in home program to decrease hypersensitivity and tenderness over the scar to be able to tolerate drying with a towel symptom-free    Baseline 10/10 tenderness with soft massage, soft textures and rougher textures.    Time 3    Period Weeks    Status New    Target Date 11/25/22      OT LONG TERM GOAL #2   Title R Thumb active range of motion increased to within normal limits for palmar /radial abduction to pick up  half a cup and opposition to within normal limits to do buttons symptom-free    Baseline Opposition to third digit.  Discomfort and hold to his fourth.  It burning pain increased as well as increased burn and pain in thumb with palmar radial abduction at 48 to 55 degrees    Time 4    Period Weeks    Status New    Target Date 12/02/22      OT LONG TERM GOAL #3   Title Right wrist active range of motion increased to within normal limits and symptom-free for patient to initiate using right hand in bathing and dressing symptom-free    Baseline Right wrist extension 35.  Flexion and radial ulnar deviation within normal limits for motion but discomfort and burning pain 10/10 over scar    Time 5    Period Weeks    Status New    Target Date 12/09/22      OT LONG TERM  GOAL #4   Title Right grip and prehension strength increased to more than 50% compared to the left for patient to use hand in bathing and dressing with no increase symptoms    Baseline Patient not using right hand increased burning pain as well as hypersensitivity and tenderness over  the scar 10/10 discomfort in    Time 8    Period Weeks    Status New    Target Date 12/30/22      OT LONG TERM GOAL #5   Title R Wrist and forearm range motion and strength increased to within normal limits for patient tender doorknob and push pull heavy door with no increase in terms    Baseline Scar hypersensitive as well as increased burning pain with touch as well as to textures.  Increased 10/10.  Not using hands.  As well as favoring and guarding right arm    Time 8    Period Weeks    Status New    Target Date 12/30/22                   Plan - 12/11/22 1139     Clinical Impression Statement Patient presented at OT evaluation with a diagnosis of right dominant hand carpal tunnel release on 09/10/2022.  By Dr. [name]. Patient reports hypersensitivity and tenderness over scar with increased edema. At eval burning pain into the thumb and fourth of fifth digit constantly - worse with AROM 10/10 .  Patient limited in active range of motion for wrist extension/flexion and increased pain in other ranges.  Patient increased discomfort and pain with composite flexion of digits.  Pt reports she is doing better and has no pain.  She has been using built up pen grip and doing word puzzles for shorter lengths of time and still doing well with this leisure task.  ROM ocntinues to improve and performing scar massage at home and reports decreased burning in palm, just a little in the area of the scar.  Increased reps this date of exercises to 3 sets of 12 with 1# weight. Cont to be limited in functional use of right dominant hand and wrist in ADLs and IADLs.  Patient can benefit from skilled OT services to increase  functional use of right dominant hand to prior level of function.    OT Occupational Profile and History Problem Focused Assessment - Including review of records relating to presenting problem    Occupational performance deficits (Please refer to evaluation for details): ADL's;IADL's;Rest and Sleep;Play;Social Participation;Leisure    Body Structure / Function / Physical Skills ADL;Edema;Dexterity;Decreased knowledge of precautions;Flexibility;ROM;UE functional use;Scar mobility;Sensation;Pain;Strength;IADL    Rehab Potential Good    Clinical Decision Making Limited treatment options, no task modification necessary    Comorbidities Affecting Occupational Performance: May have comorbidities impacting occupational performance    Modification or Assistance to Complete Evaluation  No modification of tasks or assist necessary to complete eval    OT Frequency 2x / week    OT Duration 8 weeks    OT Treatment/Interventions Self-care/ADL training;Contrast Bath;Fluidtherapy;Paraffin;Therapeutic exercise;DME and/or AE instruction;Manual Therapy;Passive range of motion;Scar mobilization;Splinting;Patient/family education    Consulted and Agree with Plan of Care Patient             Patient will benefit from skilled therapeutic intervention in order to improve the following deficits and impairments:   Body Structure / Function / Physical Skills: ADL, Edema, Dexterity, Decreased knowledge of precautions, Flexibility, ROM, UE functional use, Scar mobility, Sensation, Pain, Strength, IADL       Visit Diagnosis: Carpal tunnel syndrome, right upper limb  Pain in right hand  Scar condition and fibrosis of skin  Pain in right wrist  Muscle weakness (generalized)    Problem List Patient Active Problem List   Diagnosis Date Noted  Osteoarthritis of hips (Bilateral) 11/03/2022   Lumbar radiculitis (L5, S1) (Bilateral) (L>R) 11/03/2022   Lumbosacral radiculopathy/radiculitis at S1 (Bilateral)  (L>R) 11/03/2022   Lumbosacral radiculopathy/radiculitis at L5 (Bilateral) (L>R) 11/03/2022   Abnormal drug screen (09/22/2022) 09/25/2022   Chronic hip pain (Bilateral) (R>L) 09/22/2022   Decreased range of motion of hips (Bilateral) (R>L) 09/22/2022   Chronic low back pain (2ry area of Pain) (Bilateral) (R>L) w/o sciatica 09/22/2022   Decreased range of motion of lumbar spine 09/22/2022   Generalized osteoarthritis of multiple sites 09/22/2022   History of marijuana use 09/22/2022   Heart murmur 09/21/2022   Myocardial infarction (HCC) 09/21/2022   Chronic pain syndrome 09/21/2022   Pharmacologic therapy 09/21/2022   Disorder of skeletal system 09/21/2022   Problems influencing health status 09/21/2022   Memory impairment 08/15/2022   Type 2 diabetes mellitus (HCC) 02/04/2022   Other insomnia 08/28/2019   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   Chronic obstructive pulmonary disease (HCC) 03/06/2019   Coronary artery disease involving native coronary artery of native heart without angina pectoris 03/06/2019   Major depressive disorder, recurrent, moderate (HCC) 03/06/2019   Malignant neoplasm of left female breast (HCC) 03/06/2019   Osteopenia 03/06/2019   Vitamin D insufficiency 03/06/2019   Hypothermia 12/03/2017   Chronic pain of lower extremity (1ry area of Pain) (Bilateral) 11/17/2017   Bilateral lower extremity edema 11/17/2017   Moderate tobacco use disorder 11/17/2017   Hypertension 11/17/2017   Hypercholesterolemia 11/17/2017   Clista Rainford T Darianny Momon, OTR/L, CLT  Merrill Deanda, OT 12/11/2022, 11:48 AM  [facility] [address] [place], [address] Phone: [phone]   Fax:  [phone]  Name: AYDEN APODACA MRN: [id number] Date of Birth: April 29, 1961

## 2022-12-22 ENCOUNTER — Ambulatory Visit: Payer: 59 | Admitting: Occupational Therapy

## 2022-12-25 ENCOUNTER — Ambulatory Visit: Payer: 59 | Admitting: Occupational Therapy

## 2022-12-29 ENCOUNTER — Ambulatory Visit: Payer: 59 | Attending: Orthopedic Surgery | Admitting: Occupational Therapy

## 2022-12-29 DIAGNOSIS — M6281 Muscle weakness (generalized): Secondary | ICD-10-CM | POA: Insufficient documentation

## 2022-12-29 DIAGNOSIS — M79641 Pain in right hand: Secondary | ICD-10-CM | POA: Insufficient documentation

## 2022-12-29 DIAGNOSIS — G5601 Carpal tunnel syndrome, right upper limb: Secondary | ICD-10-CM | POA: Insufficient documentation

## 2022-12-29 DIAGNOSIS — M25531 Pain in right wrist: Secondary | ICD-10-CM | POA: Diagnosis present

## 2022-12-29 DIAGNOSIS — L905 Scar conditions and fibrosis of skin: Secondary | ICD-10-CM | POA: Diagnosis present

## 2022-12-29 NOTE — Therapy (Signed)
[facility] [address] [place], [place], [address] Phone: [phone]   Fax:  [phone]  Occupational Therapy Treatment/10th visit  Patient Details  Name: DENAYE ANSPACH MRN: [id number] Date of Birth: [birth year] Referring Provider (OT): Dr [name]   Encounter Date: 12/29/2022   OT End of Session - 12/29/22 1216     Visit Number 10    Number of Visits 12    Date for OT Re-Evaluation 12/30/22    OT Start Time 1030    OT Stop Time 1118    OT Time Calculation (min) 48 min    Activity Tolerance Patient tolerated treatment well    Behavior During Therapy [facility] for tasks assessed/performed             Past Medical History:  Diagnosis Date   Anemia    Anginal pain (HCC)    Breast cancer (HCC) 2008   chemo mastectomy left   Cancer (HCC)    breast   COPD (chronic obstructive pulmonary disease) (HCC)    Coronary artery disease    History of left heart catheterization    w/ pci to RCA   Hx of migraine headaches    Hypertension    Myocardial infarction [facility]) 2010   Personal history of chemotherapy    Wears dentures    full upper (currently unable to wear)    Past Surgical History:  Procedure Laterality Date   ABDOMINAL HYSTERECTOMY     LEFT HEART CATH AND CORONARY ANGIOGRAPHY Left 09/07/2019   Procedure: LEFT HEART CATH AND CORONARY ANGIOGRAPHY;  Surgeon: Alwyn Pea, MD;  Location: [facility] INVASIVE CV LAB;  Service: Cardiovascular;  Laterality: Left;   LESION EXCISION WITH COMPLEX REPAIR Right 12/04/2020   Procedure: Excision right upper lip intraoral lesion;  Surgeon: Geanie Logan, MD;  Location: [facility];  Service: ENT;  Laterality: Right;  Latex   MASTECTOMY Left 2008   MINOR EXCISION OF ORAL LESION N/A 10/02/2020   Procedure: excision of right upper lip mass, intraoral;  Surgeon: Geanie Logan, MD;  Location: [facility];  Service: ENT;  Laterality: N/A;  Latex    There were no vitals filed  for this visit.   Subjective Assessment - 12/29/22 1215     Subjective  I was doing good the last 3 weeks but then it hurt over the weekend with the rain.  From my palm into my forearm.  But otherwise I am trying to use it much more.    Pertinent History 10/24/22 Ortho NOTE DR [name]: Cambrey Decatur is a 62 y.o. female here today who is 5.5 weeks status post carpal tunnel release performed on 09/10/2022. At this time, she is having burning pain predominantly in the thumb, ring, and little fingers after having had prior carpal tunnel release.  The patient has been engaging in hand massages. She notes burning sensation from opposing her thumb. Refer to OT    Patient Stated Goals I want the pain and tenderness better in my R hand and wrist so I can use my hand to cook ,clean, get dress, bath and brush my teeth    Currently in Pain? No/denies                [facility] OT Assessment - 12/29/22 0001       Strength   Right Hand Grip (lbs) 38    Right Hand Lateral Pinch 12 lbs    Right Hand 3 Point Pinch 9 lbs  Left Hand Grip (lbs) 58    Left Hand Lateral Pinch 15 lbs    Left Hand 3 Point Pinch 14 lbs             Patient return with with great progress in increased grip and prehension strength on the right hand. Increase active range of motion to within normal limits but tightness with wrist extension. Tightness mostly in the forearm and digit flexors-with composite stretch. Especially with pushing heavy door or pushing up. Patient able to carry 5 pounds And lift about 3 to 4 pounds. Patient small area on the radial side of carpal tunnel scar fibrotic and tender.        OT Treatments/Exercises (OP) - 12/29/22 0001       RUE Paraffin   Number Minutes Paraffin 8 Minutes    RUE Paraffin Location Hand   wrist   Comments decrease pain and scar tissue            scar massage done -using graston tool nr 2 for brushing over scar and thenar eminence As well as sweeping over the volar  wrist and forearm prior to composite flexor stretches and pronation and supination 5 reps hold 5 seconds Patient to do at home after moist heat and scar massage  Less pain and no burning after sessions Upgrade to 3 pound weight using her CMC neoprene for supination pronation support on thigh 12 reps, radial ulnar deviation 12 reps  And in sitting elbow flexion with a punch overhead 12 reps with the rest of the thigh in between.   If symptom-free can increase in 3 to 5 days to a second set 1 time a day  If pain and symptoms free patient can increase 10 to 12 days to a third set 1 x day  Patient to follow-up on well visit in 2 to 3 weeks with possible discharge      OT Education - 12/29/22 1216     Education Details progress and changes in HEP    Person(s) Educated Patient    Methods Explanation;Demonstration;Tactile cues;Verbal cues;Handout    Comprehension Verbal cues required;Returned demonstration;Verbalized understanding                 OT Long Term Goals - 11/04/22 1626       OT LONG TERM GOAL #1   Title Patient to be independent in home program to decrease hypersensitivity and tenderness over the scar to be able to tolerate drying with a towel symptom-free    Baseline 10/10 tenderness with soft massage, soft textures and rougher textures.    Time 3    Period Weeks    Status New    Target Date 11/25/22      OT LONG TERM GOAL #2   Title R Thumb active range of motion increased to within normal limits for palmar /radial abduction to pick up  half a cup and opposition to within normal limits to do buttons symptom-free    Baseline Opposition to third digit.  Discomfort and hold to his fourth.  It burning pain increased as well as increased burn and pain in thumb with palmar radial abduction at 48 to 55 degrees    Time 4    Period Weeks    Status New    Target Date 12/02/22      OT LONG TERM GOAL #3   Title Right wrist active range of motion increased to within  normal limits and symptom-free for patient to initiate using right  hand in bathing and dressing symptom-free    Baseline Right wrist extension 35.  Flexion and radial ulnar deviation within normal limits for motion but discomfort and burning pain 10/10 over scar    Time 5    Period Weeks    Status New    Target Date 12/09/22      OT LONG TERM GOAL #4   Title Right grip and prehension strength increased to more than 50% compared to the left for patient to use hand in bathing and dressing with no increase symptoms    Baseline Patient not using right hand increased burning pain as well as hypersensitivity and tenderness over the scar 10/10 discomfort in    Time 8    Period Weeks    Status New    Target Date 12/30/22      OT LONG TERM GOAL #5   Title R Wrist and forearm range motion and strength increased to within normal limits for patient tender doorknob and push pull heavy door with no increase in terms    Baseline Scar hypersensitive as well as increased burning pain with touch as well as to textures.  Increased 10/10.  Not using hands.  As well as favoring and guarding right arm    Time 8    Period Weeks    Status New    Target Date 12/30/22                   Plan - 12/29/22 1217     Clinical Impression Statement Patient presented at OT evaluation with a diagnosis of right dominant hand carpal tunnel release on 09/10/2022.  By Dr. [name]. Pt made great progress in edema , scar tissue , pain and ROM and grip/prhension strength. Pt do have some pain and discomfort at thumb CMC ? OA and then small area of discomfort radially to scar . 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 Pt had been using CMC neoprene wrap the last 2-3 wks less than 50% of time and show great progress in grip and prehension - but still some tightness with composite wrist and digits extention.  Pt able to use hand in clinic pushing, pulling ,  carrying and lifting with less pain up to about 5 lbs. Pt  can cont to use CMC neoprene  as needed with functional tasks that cause thumb pain . Pt to cont with heat, scar massage and composite forearm and digits flexor stretch and MEd N glide prior to upgrade to 3 lbs for UB strengthening. Cont to be limited in functional use of right dominant hand, wrist and UE ADLs and IADLs.  Patient can benefit from skilled OT services to increase functional use of right dominant hand to prior level of function.    OT Occupational Profile and History Problem Focused Assessment - Including review of records relating to presenting problem    Occupational performance deficits (Please refer to evaluation for details): ADL's;IADL's;Rest and Sleep;Play;Social Participation;Leisure    Body Structure / Function / Physical Skills ADL;Edema;Dexterity;Decreased knowledge of precautions;Flexibility;ROM;UE functional use;Scar mobility;Sensation;Pain;Strength;IADL    Rehab Potential Good    Clinical Decision Making Limited treatment options, no task modification necessary    Comorbidities Affecting Occupational Performance: May have comorbidities impacting occupational performance    Modification or Assistance to Complete Evaluation  No modification of tasks or assist necessary to complete eval    OT Frequency Biweekly    OT  Duration 2 weeks    OT Treatment/Interventions Self-care/ADL training;Contrast Bath;Fluidtherapy;Paraffin;Therapeutic exercise;DME and/or AE instruction;Manual Therapy;Passive range of motion;Scar mobilization;Splinting;Patient/family education    Consulted and Agree with Plan of Care Patient             Patient will benefit from skilled therapeutic intervention in order to improve the following deficits and impairments:   Body Structure / Function / Physical Skills: ADL, Edema, Dexterity, Decreased knowledge of precautions, Flexibility, ROM, UE functional use, Scar mobility, Sensation, Pain,  Strength, IADL       Visit Diagnosis: Carpal tunnel syndrome, right upper limb  Pain in right wrist  Muscle weakness (generalized)  Scar condition and fibrosis of skin  Pain in right hand    Problem List Patient Active Problem List   Diagnosis Date Noted   Osteoarthritis of hips (Bilateral) 11/03/2022   Lumbar radiculitis (L5, S1) (Bilateral) (L>R) 11/03/2022   Lumbosacral radiculopathy/radiculitis at S1 (Bilateral) (L>R) 11/03/2022   Lumbosacral radiculopathy/radiculitis at L5 (Bilateral) (L>R) 11/03/2022   Abnormal drug screen (09/22/2022) 09/25/2022   Chronic hip pain (Bilateral) (R>L) 09/22/2022   Decreased range of motion of hips (Bilateral) (R>L) 09/22/2022   Chronic low back pain (2ry area of Pain) (Bilateral) (R>L) w/o sciatica 09/22/2022   Decreased range of motion of lumbar spine 09/22/2022   Generalized osteoarthritis of multiple sites 09/22/2022   History of marijuana use 09/22/2022   Heart murmur 09/21/2022   Myocardial infarction (HCC) 09/21/2022   Chronic pain syndrome 09/21/2022   Pharmacologic therapy 09/21/2022   Disorder of skeletal system 09/21/2022   Problems influencing health status 09/21/2022   Memory impairment 08/15/2022   Type 2 diabetes mellitus (HCC) 02/04/2022   Other insomnia 08/28/2019   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   Chronic obstructive pulmonary disease (HCC) 03/06/2019   Coronary artery disease involving native coronary artery of native heart without angina pectoris 03/06/2019   Major depressive disorder, recurrent, moderate (HCC) 03/06/2019   Malignant neoplasm of left female breast (HCC) 03/06/2019   Osteopenia 03/06/2019   Vitamin D insufficiency 03/06/2019   Hypothermia 12/03/2017   Chronic pain of lower extremity (1ry area of Pain) (Bilateral) 11/17/2017   Bilateral lower extremity edema 11/17/2017   Moderate tobacco use disorder 11/17/2017   Hypertension 11/17/2017   Hypercholesterolemia 11/17/2017    Oletta Cohn, OTR/L,CLT 12/29/2022, 12:23 PM  [facility] [address] [place], [address] Phone: [phone]   Fax:  [phone]  Name: CYRAH BARRINGER MRN: [id number] Date of Birth: 13-Jul-1960

## 2023-01-12 ENCOUNTER — Other Ambulatory Visit: Payer: Self-pay | Admitting: Orthopedic Surgery

## 2023-01-19 ENCOUNTER — Encounter: Payer: Self-pay | Admitting: Orthopedic Surgery

## 2023-01-20 ENCOUNTER — Ambulatory Visit: Payer: 59 | Admitting: Occupational Therapy

## 2023-01-20 ENCOUNTER — Encounter: Payer: Self-pay | Admitting: Orthopedic Surgery

## 2023-01-20 DIAGNOSIS — G5601 Carpal tunnel syndrome, right upper limb: Secondary | ICD-10-CM

## 2023-01-20 DIAGNOSIS — M79641 Pain in right hand: Secondary | ICD-10-CM

## 2023-01-20 DIAGNOSIS — M25531 Pain in right wrist: Secondary | ICD-10-CM

## 2023-01-20 DIAGNOSIS — L905 Scar conditions and fibrosis of skin: Secondary | ICD-10-CM

## 2023-01-20 DIAGNOSIS — M6281 Muscle weakness (generalized): Secondary | ICD-10-CM

## 2023-01-20 NOTE — Anesthesia Preprocedure Evaluation (Addendum)
Anesthesia Evaluation  Patient identified by MRN, date of birth, ID band Patient awake    Reviewed: Allergy & Precautions, H&P , NPO status , Patient's Chart, lab work & pertinent test results  Airway Mallampati: III  TM Distance: >3 FB Neck ROM: Full    Dental no notable dental hx. (+) Edentulous Upper, Poor Dentition, Chipped Missing all upper teeth, lower right tooth chipped::   Pulmonary COPD, Current SmokerPatient did not abstain from smoking.   Pulmonary exam normal breath sounds clear to auscultation       Cardiovascular hypertension, + angina  + CAD and + Past MI  Normal cardiovascular exam+ Valvular Problems/Murmurs  Rhythm:Regular Rate:Normal   PCI and stent RCA (05/26/1997  09-07-19  Prox Cx lesion is 70% stenosed.  Widely patent right coronary artery stent  No significant disease in LAD  Preserved left ventricular function around 55%  Prox RCA to Mid RCA lesion is 10% stenosed.   Conclusion Normal left ventricular function Single-vessel coronary disease of around 70% proximal circumflex Widely patent mid RCA stent Recommend medical therapy for now    Neuro/Psych  PSYCHIATRIC DISORDERS  Depression     Neuromuscular disease negative neurological ROS  negative psych ROS   GI/Hepatic negative GI ROS, Neg liver ROS,,,  Endo/Other  diabetes    Renal/GU negative Renal ROS  negative genitourinary   Musculoskeletal  (+) Arthritis ,    Abdominal   Peds negative pediatric ROS (+)  Hematology negative hematology ROS (+) Blood dyscrasia, anemia   Anesthesia Other Findings Cancer (HCC) Hypertension Personal history of chemotherapy Anginal pain (HCC) Coronary artery disease History of left heart catheterization Breast cancer (HCC) Myocardial infarction Smoker COPD (chronic obstructive pulmonary disease)--sometimes uses inhaler at home, but is not on home oxygen Memory impairment  08-15-22 Anemia Wears dentures Hx of migraine headaches Hemoglobin C trait Previous carpal tunnel release 09-10-22   Reproductive/Obstetrics negative OB ROS                             Anesthesia Physical Anesthesia Plan  ASA: 3  Anesthesia Plan: General   Post-op Pain Management:    Induction: Intravenous  PONV Risk Score and Plan:   Airway Management Planned: LMA  Additional Equipment:   Intra-op Plan:   Post-operative Plan: Extubation in OR  Informed Consent: I have reviewed the patients History and Physical, chart, labs and discussed the procedure including the risks, benefits and alternatives for the proposed anesthesia with the patient or authorized representative who has indicated his/her understanding and acceptance.     Dental Advisory Given  Plan Discussed with: Anesthesiologist, CRNA and Surgeon  Anesthesia Plan Comments: (Patient consented for risks of anesthesia including but not limited to:  - adverse reactions to medications - damage to eyes, teeth, lips or other oral mucosa - nerve damage due to positioning  - sore throat or hoarseness - Damage to heart, brain, nerves, lungs, other parts of body or loss of life  Patient voiced understanding.)        Anesthesia Quick Evaluation

## 2023-01-23 ENCOUNTER — Encounter: Payer: Self-pay | Admitting: Occupational Therapy

## 2023-01-23 NOTE — Therapy (Unsigned)
[facility] [address] [place], [place], [address] Phone: [phone]   Fax:  [phone]  Occupational Therapy Treatment/Recertification/Discharge Summary  Patient Details  Name: Tonya Keith MRN: [id number] Date of Birth: 1961/01/03 Referring Provider (OT): Dr. [name]   Encounter Date: 01/20/2023   OT End of Session - 01/23/23 2126     Visit Number 11    Number of Visits 12    Date for OT Re-Evaluation 12/30/22    OT Start Time 1031    OT Stop Time 1115    OT Time Calculation (min) 44 min    Activity Tolerance Patient tolerated treatment well    Behavior During Therapy [facility] for tasks assessed/performed             Past Medical History:  Diagnosis Date   Anemia    Anginal pain (HCC)    Breast cancer (HCC) 2008   chemo mastectomy left   Cancer (HCC)    breast   COPD (chronic obstructive pulmonary disease) (HCC)    Coronary artery disease    History of left heart catheterization    w/ pci to RCA   Hx of migraine headaches    Hypertension    Memory impairment    Myocardial infarction [facility]) 2010   Personal history of chemotherapy    Wears dentures    full upper (currently unable to wear)    Past Surgical History:  Procedure Laterality Date   ABDOMINAL HYSTERECTOMY     ANKLE FRACTURE SURGERY Right    LEFT HEART CATH AND CORONARY ANGIOGRAPHY Left 09/07/2019   Procedure: LEFT HEART CATH AND CORONARY ANGIOGRAPHY;  Surgeon: Alwyn Pea, MD;  Location: [facility] INVASIVE CV LAB;  Service: Cardiovascular;  Laterality: Left;   LESION EXCISION WITH COMPLEX REPAIR Right 12/04/2020   Procedure: Excision right upper lip intraoral lesion;  Surgeon: Geanie Logan, MD;  Location: [facility];  Service: ENT;  Laterality: Right;  Latex   MASTECTOMY Left 2008   MINOR EXCISION OF ORAL LESION N/A 10/02/2020   Procedure: excision of right upper lip mass, intraoral;  Surgeon: Geanie Logan, MD;  Location: [facility];  Service: ENT;  Laterality: N/A;  Latex    There were no vitals filed for this visit.   Subjective Assessment - 01/23/23 2126     Pertinent History 10/24/22 Ortho NOTE DR [name]: Tonya Keith is a 62 y.o. female here today who is 5.5 weeks status post carpal tunnel release performed on 09/10/2022. At this time, she is having burning pain predominantly in the thumb, ring, and little fingers after having had prior carpal tunnel release.  The patient has been engaging in hand massages. She notes burning sensation from opposing her thumb. Refer to OT    Patient Stated Goals I want the pain and tenderness better in my R hand and wrist so I can use my hand to cook ,clean, get dress, bath and brush my teeth            Pt states, "Look at my hand"  Pt moving it around, reports it feels great and she is doing good with tasks at home.  Pt denies any pain this date.  She still has some tingling at times.  She does report she will potentially have surgery on her left hand next week on 01/27/2023.  Reassessment of grip and pinch, see flow sheet for details and pt continues to demonstrate great progress in the right hand.  Improved AROM of the hand and wrist and within normal limits.   Reports decreased tightness in forearm and wrist and able to demonstrate composite fisting.  She is now able to carry up to 10# and demonstrated in the clinic to pick up and carry for short distances around the gym.    Pt able to demonstrate her home program with use of contrast and exercises.    Paraffin to right hand for 8 mins prior to exercises this date to increase tissue mobility and ROM, decrease pain.    Review of scar massage, use of Graston over volar wrist and forearm prior to stretches.    Supination/pronation on thigh for 12 reps for 3 sets, no increased pain or issues.  3# weight   Pt able to perform washing dishes better, sweeping with use of neoprene support and independent with her basic self care  tasks.    [facility] OT Assessment - 01/24/23 1117       Assessment   Medical Diagnosis R CTR    Referring Provider (OT) Dr. [name]    Onset Date/Surgical Date 09/10/22    Hand Dominance Right      AROM   Right Wrist Extension 60 Degrees    Right Wrist Flexion 88 Degrees    Right Wrist Radial Deviation 20 Degrees    Right Wrist Ulnar Deviation 30 Degrees      Strength   Right Hand Grip (lbs) 47    Right Hand Lateral Pinch 14 lbs    Right Hand 3 Point Pinch 13 lbs    Left Hand Grip (lbs) 60    Left Hand Lateral Pinch 14 lbs    Left Hand 3 Point Pinch 13 lbs               OT Education - 01/23/23 2126     Education Details progress and changes in HEP    Person(s) Educated Patient    Methods Explanation;Demonstration;Tactile cues;Verbal cues;Handout    Comprehension Verbal cues required;Returned demonstration;Verbalized understanding                 OT Long Term Goals - 01/24/23 1123       OT LONG TERM GOAL #1   Title Patient to be independent in home program to decrease hypersensitivity and tenderness over the scar to be able to tolerate drying with a towel symptom-free    Time 3    Period Weeks    Status Achieved    Target Date 11/25/22      OT LONG TERM GOAL #2   Title R Thumb active range of motion increased to within normal limits for palmar /radial abduction to pick up  half a cup and opposition to within normal limits to do buttons symptom-free    Baseline Opposition to third digit.  Discomfort and hold to his fourth.  It burning pain increased as well as increased burn and pain in thumb with palmar radial abduction at 48 to 55 degrees    Time 4    Period Weeks    Status Achieved    Target Date 12/02/22      OT LONG TERM GOAL #3   Title Right wrist active range of motion increased to within normal limits and symptom-free for patient to initiate using right hand in bathing and dressing symptom-free    Baseline Right wrist extension 35.  Flexion and radial  ulnar deviation within normal limits for motion but discomfort and burning pain 10/10 over scar  Time 5    Period Weeks    Status Achieved    Target Date 12/09/22      OT LONG TERM GOAL #4   Title Right grip and prehension strength increased to more than 50% compared to the left for patient to use hand in bathing and dressing with no increase symptoms    Baseline Patient not using right hand increased burning pain as well as hypersensitivity and tenderness over the scar 10/10 discomfort in    Time 8    Period Weeks    Status Achieved    Target Date 12/30/22      OT LONG TERM GOAL #5   Title R Wrist and forearm range motion and strength increased to within normal limits for patient tender doorknob and push pull heavy door with no increase in terms    Baseline Scar hypersensitive as well as increased burning pain with touch as well as to textures.  Increased 10/10.  Not using hands.  As well as favoring and guarding right arm    Time 8    Period Weeks    Status Achieved    Target Date 12/30/22                   Plan - 01/23/23 2126     Clinical Impression Statement Patient presented at OT evaluation with a diagnosis of right dominant hand carpal tunnel release on 09/10/2022.  By Dr. [name]. Pt made great progress in edema , scar tissue , pain and ROM and grip/prhension strength. Pt do have some pain and discomfort at thumb CMC  OA and then small area of discomfort radially to scar . 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 Pt had been using CMC neoprene wrap the last 2-3 wks less than 50% of time and show great progress in grip and prehension Pt able to use hand in clinic pushing, pulling , carrying and lifting with less pain up to about 5 lbs. Pt  can cont to use CMC neoprene as needed with functional tasks that cause thumb pain .  Pt has made great progress and demonstrates improvements overall in right grip  and pinch strength, ability to hold, push and carry objects.  Was able to demonstrate carrying of 10# this date without difficulty or pain.  She has met her goals and ready for discharge from OT.  Pt to continue with home program to maintain progress.  Will discharge at this time.  She has plans for surgery for her left wrist next week, if she requires additional therapy, she will need a new order for eval and treatment.    OT Occupational Profile and History Problem Focused Assessment - Including review of records relating to presenting problem    Occupational performance deficits (Please refer to evaluation for details): ADL's;IADL's;Rest and Sleep;Play;Social Participation;Leisure    Body Structure / Function / Physical Skills ADL;Edema;Dexterity;Decreased knowledge of precautions;Flexibility;ROM;UE functional use;Scar mobility;Sensation;Pain;Strength;IADL    Rehab Potential Good    Clinical Decision Making Limited treatment options, no task modification necessary    Comorbidities Affecting Occupational Performance: May have comorbidities impacting occupational performance    Modification or Assistance to Complete Evaluation  No modification of tasks or assist necessary to complete eval    OT Frequency Biweekly    OT Duration 2 weeks    OT Treatment/Interventions Self-care/ADL training;Contrast Bath;Fluidtherapy;Paraffin;Therapeutic exercise;DME and/or AE instruction;Manual Therapy;Passive range of motion;Scar mobilization;Splinting;Patient/family education  Consulted and Agree with Plan of Care Patient             Patient will benefit from skilled therapeutic intervention in order to improve the following deficits and impairments:   Body Structure / Function / Physical Skills: ADL, Edema, Dexterity, Decreased knowledge of precautions, Flexibility, ROM, UE functional use, Scar mobility, Sensation, Pain, Strength, IADL       Visit Diagnosis: Carpal tunnel syndrome, right upper  limb  Pain in right wrist  Muscle weakness (generalized)  Scar condition and fibrosis of skin  Pain in right hand    Problem List Patient Active Problem List   Diagnosis Date Noted   Osteoarthritis of hips (Bilateral) 11/03/2022   Lumbar radiculitis (L5, S1) (Bilateral) (L>R) 11/03/2022   Lumbosacral radiculopathy/radiculitis at S1 (Bilateral) (L>R) 11/03/2022   Lumbosacral radiculopathy/radiculitis at L5 (Bilateral) (L>R) 11/03/2022   Abnormal drug screen (09/22/2022) 09/25/2022   Chronic hip pain (Bilateral) (R>L) 09/22/2022   Decreased range of motion of hips (Bilateral) (R>L) 09/22/2022   Chronic low back pain (2ry area of Pain) (Bilateral) (R>L) w/o sciatica 09/22/2022   Decreased range of motion of lumbar spine 09/22/2022   Generalized osteoarthritis of multiple sites 09/22/2022   History of marijuana use 09/22/2022   Heart murmur 09/21/2022   Myocardial infarction (HCC) 09/21/2022   Chronic pain syndrome 09/21/2022   Pharmacologic therapy 09/21/2022   Disorder of skeletal system 09/21/2022   Problems influencing health status 09/21/2022   Memory impairment 08/15/2022   Type 2 diabetes mellitus (HCC) 02/04/2022   Other insomnia 08/28/2019   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   Chronic obstructive pulmonary disease (HCC) 03/06/2019   Coronary artery disease involving native coronary artery of native heart without angina pectoris 03/06/2019   Major depressive disorder, recurrent, moderate (HCC) 03/06/2019   Malignant neoplasm of left female breast (HCC) 03/06/2019   Osteopenia 03/06/2019   Vitamin D insufficiency 03/06/2019   Hypothermia 12/03/2017   Chronic pain of lower extremity (1ry area of Pain) (Bilateral) 11/17/2017   Bilateral lower extremity edema 11/17/2017   Moderate tobacco use disorder 11/17/2017   Hypertension 11/17/2017   Hypercholesterolemia 11/17/2017   Demetrious Rainford T Naythan Douthit, OTR/L, CLT  Md Smola, OT 01/24/2023, 11:25 AM  [facility] [address] [place], [address] Phone: [phone]   Fax:  [phone]  Name: Tonya Keith MRN: [id number] Date of Birth: 09/20/1960

## 2023-01-27 ENCOUNTER — Ambulatory Visit: Payer: 59 | Admitting: Anesthesiology

## 2023-01-27 ENCOUNTER — Other Ambulatory Visit: Payer: Self-pay

## 2023-01-27 ENCOUNTER — Encounter: Payer: Self-pay | Admitting: Orthopedic Surgery

## 2023-01-27 ENCOUNTER — Ambulatory Visit
Admission: RE | Admit: 2023-01-27 | Discharge: 2023-01-27 | Disposition: A | Discharge status: Home / Self Care | Payer: 59 | Attending: Orthopedic Surgery | Admitting: Orthopedic Surgery

## 2023-01-27 ENCOUNTER — Encounter
Admission: RE | Disposition: A | Discharge status: Home / Self Care | Payer: Self-pay | Source: Home / Self Care | Attending: Orthopedic Surgery

## 2023-01-27 DIAGNOSIS — I252 Old myocardial infarction: Secondary | ICD-10-CM | POA: Insufficient documentation

## 2023-01-27 DIAGNOSIS — Z9221 Personal history of antineoplastic chemotherapy: Secondary | ICD-10-CM | POA: Diagnosis not present

## 2023-01-27 DIAGNOSIS — J449 Chronic obstructive pulmonary disease, unspecified: Secondary | ICD-10-CM | POA: Insufficient documentation

## 2023-01-27 DIAGNOSIS — G5602 Carpal tunnel syndrome, left upper limb: Secondary | ICD-10-CM | POA: Diagnosis present

## 2023-01-27 DIAGNOSIS — I2510 Atherosclerotic heart disease of native coronary artery without angina pectoris: Secondary | ICD-10-CM | POA: Diagnosis not present

## 2023-01-27 DIAGNOSIS — I10 Essential (primary) hypertension: Secondary | ICD-10-CM | POA: Diagnosis not present

## 2023-01-27 DIAGNOSIS — Z79899 Other long term (current) drug therapy: Secondary | ICD-10-CM | POA: Diagnosis not present

## 2023-01-27 DIAGNOSIS — Z853 Personal history of malignant neoplasm of breast: Secondary | ICD-10-CM | POA: Diagnosis not present

## 2023-01-27 HISTORY — DX: Other amnesia: R41.3

## 2023-01-27 HISTORY — PX: CARPAL TUNNEL RELEASE: SHX101

## 2023-01-27 SURGERY — CARPAL TUNNEL RELEASE
Anesthesia: General | Site: Wrist | Laterality: Left

## 2023-01-27 MED ORDER — LIDOCAINE HCL (CARDIAC) PF 100 MG/5ML IV SOSY
PREFILLED_SYRINGE | INTRAVENOUS | Status: DC | PRN
  Administered 2023-01-27: 40 mg via INTRAVENOUS

## 2023-01-27 MED ORDER — SODIUM CHLORIDE 0.9 % IV SOLN: INTRAVENOUS | Status: DC

## 2023-01-27 MED ORDER — HYDROCODONE-ACETAMINOPHEN 5-325 MG PO TABS: 1.0000 | ORAL_TABLET | ORAL | Status: DC | PRN

## 2023-01-27 MED ORDER — MORPHINE SULFATE (PF) 2 MG/ML IV SOLN: 0.5000 mg | INTRAVENOUS | Status: DC | PRN

## 2023-01-27 MED ORDER — MIDAZOLAM HCL 2 MG/2ML IJ SOLN
INTRAMUSCULAR | Status: DC | PRN
  Administered 2023-01-27: 2 mg via INTRAVENOUS

## 2023-01-27 MED ORDER — ACETAMINOPHEN 325 MG PO TABS: 325.0000 mg | ORAL_TABLET | Freq: Four times a day (QID) | ORAL | Status: DC | PRN

## 2023-01-27 MED ORDER — CEFAZOLIN SODIUM-DEXTROSE 2-4 GM/100ML-% IV SOLN
2.0000 g | INTRAVENOUS | Status: AC
  Administered 2023-01-27: 2 g via INTRAVENOUS

## 2023-01-27 MED ORDER — LACTATED RINGERS IV SOLN: INTRAVENOUS | Status: DC

## 2023-01-27 MED ORDER — BUPIVACAINE HCL 0.5 % IJ SOLN
INTRAMUSCULAR | Status: DC | PRN
  Administered 2023-01-27: 10 mL

## 2023-01-27 MED ORDER — ONDANSETRON HCL 4 MG/2ML IJ SOLN
INTRAMUSCULAR | Status: DC | PRN
Start: 2023-01-27 — End: 2023-01-27
  Administered 2023-01-27: 4 mg via INTRAVENOUS

## 2023-01-27 MED ORDER — DEXAMETHASONE SODIUM PHOSPHATE 10 MG/ML IJ SOLN
INTRAMUSCULAR | Status: DC | PRN
Start: 2023-01-27 — End: 2023-01-27
  Administered 2023-01-27: 4 mg via INTRAVENOUS

## 2023-01-27 MED ORDER — 0.9 % SODIUM CHLORIDE (POUR BTL) OPTIME
TOPICAL | Status: DC | PRN
  Administered 2023-01-27: 500 mL

## 2023-01-27 MED ORDER — FENTANYL CITRATE (PF) 100 MCG/2ML IJ SOLN
INTRAMUSCULAR | Status: DC | PRN
  Administered 2023-01-27 (×2): 50 ug via INTRAVENOUS

## 2023-01-27 MED ORDER — PROPOFOL 500 MG/50ML IV EMUL
INTRAVENOUS | Status: DC | PRN
  Administered 2023-01-27: 100 ug/kg/min via INTRAVENOUS

## 2023-01-27 MED ORDER — PROPOFOL 10 MG/ML IV BOLUS
INTRAVENOUS | Status: DC | PRN
  Administered 2023-01-27: 20 mg via INTRAVENOUS
  Administered 2023-01-27: 30 mg via INTRAVENOUS

## 2023-01-27 MED ORDER — ONDANSETRON HCL 4 MG PO TABS: 4.0000 mg | ORAL_TABLET | Freq: Four times a day (QID) | ORAL | Status: DC | PRN

## 2023-01-27 MED ORDER — OXYCODONE HCL 5 MG PO TABS: 10.0000 mg | ORAL_TABLET | Freq: Once | ORAL | Status: DC

## 2023-01-27 MED ORDER — HYDROCODONE-ACETAMINOPHEN 5-325 MG PO TABS: 1.0000 | ORAL_TABLET | ORAL | 0 refills | Status: DC | PRN

## 2023-01-27 MED ORDER — ONDANSETRON HCL 4 MG/2ML IJ SOLN: 4.0000 mg | Freq: Four times a day (QID) | INTRAMUSCULAR | Status: DC | PRN

## 2023-01-27 SURGICAL SUPPLY — 20 items
APL PRP STRL LF DISP 70% ISPRP (MISCELLANEOUS) ×1
BNDG CMPR STD VLCR NS LF 5.8X4 (GAUZE/BANDAGES/DRESSINGS) ×1
BNDG ELASTIC 4X5.8 VLCR NS LF (GAUZE/BANDAGES/DRESSINGS) ×1 IMPLANT
CHLORAPREP W/TINT 26 (MISCELLANEOUS) ×1 IMPLANT
COVER LIGHT HANDLE UNIVERSAL (MISCELLANEOUS) ×2 IMPLANT
CUFF TOURN SGL QUICK 18X4 (TOURNIQUET CUFF) IMPLANT
GAUZE SPONGE 4X4 12PLY STRL (GAUZE/BANDAGES/DRESSINGS) ×1 IMPLANT
GAUZE XEROFORM 1X8 LF (GAUZE/BANDAGES/DRESSINGS) ×1 IMPLANT
GLOVE SURG SYN 9.0 PF PI (GLOVE) ×1 IMPLANT
GOWN STRL REIN 2XL XLG LVL4 (GOWN DISPOSABLE) ×2 IMPLANT
GOWN STRL REUS W/ TWL LRG LVL3 (GOWN DISPOSABLE) ×1 IMPLANT
GOWN STRL REUS W/TWL LRG LVL3 (GOWN DISPOSABLE) ×1
KIT TURNOVER KIT A (KITS) ×1 IMPLANT
NS IRRIG 500ML POUR BTL (IV SOLUTION) ×1 IMPLANT
PACK EXTREMITY ARMC (MISCELLANEOUS) ×1 IMPLANT
PAD CAST 4YDX4 CTTN HI CHSV (CAST SUPPLIES) ×1 IMPLANT
PADDING CAST COTTON 4X4 STRL (CAST SUPPLIES) ×1
SUT ETHILON 4-0 (SUTURE) ×1
SUT ETHILON 4-0 FS2 18XMFL BLK (SUTURE) ×1
SUTURE ETHLN 4-0 FS2 18XMF BLK (SUTURE) ×1 IMPLANT

## 2023-01-27 NOTE — H&P (Signed)
Chief Complaint Patient presents with Right Wrist - Follow-up, Numbness, Pain Right Little Finger - Pain   History of the Present Illness: Tonya Keith is a 62 y.o. female here today who underwent carpal tunnel release on 09/10/2022 but continues to experience a burning sensation. She had a consultation with Dr. Sherryll Burger earlier this week. Hand therapy was also performed on 12/11/2022, during which she reported an improvement in her hand condition but continued to experience some burning.  She reports an improvement in her hand condition and attributing her symptoms to her arthritis. The scar has softened, but she continues to experience pain.  She expresses a desire to proceed with surgery on her left hand.  I have reviewed past medical, surgical, social and family history, and allergies as documented in the EMR.  Past Medical History: Past Medical History: Diagnosis Date Abnormal EKG Arthritis Breast cancer (CMS/HHS-HCC) S/P chemo Centrilobular emphysema (CMS/HHS-HCC) Coronary artery disease Depression Diabetes (CMS/HHS-HCC) Heart murmur Helicobacter pylori (H. pylori) Hemoglobin C trait (CMS-HCC) 03/22/2019 History of angina stable Hypercholesterolemia Hypertension Memory impairment 08/15/2022 Migraines Myocardial infarction (CMS/HHS-HCC) Osteopenia 03/06/2019 Smoking Trichomonosis 05/17/2020  Past Surgical History: Past Surgical History: Procedure Laterality Date PCI and stent RCA 05/26/1997 bare metal stent Release of bilateral trigger thumbs 01/07/2008 carpal tunnel release surgery Right 09/10/2022 Dr. Kennedy Bucker Dental surgery MASTECTOMY SIMPLE S/P eye surgery VAGINAL HYSTERECTOMY  Past Family History: Family History Problem Relation Age of Onset Asthma Mother High blood pressure (Hypertension) Mother High blood pressure (Hypertension) Father  Medications: Current Outpatient Medications Medication Sig Dispense Refill acetaminophen (TYLENOL) 650 MG ER  tablet Take 650 mg by mouth every 8 (eight) hours as needed ADVOCATE RAPID-SAFE LANCING Misc USE AS DIRECTED. 1 albuterol MDI, PROVENTIL, VENTOLIN, PROAIR, HFA 90 mcg/actuation inhaler INHALE 2 INHALATIONS INTO THE LUNGS EVERY 6 HOURS AS NEEDED FOR WHEEZING 18 g 3 amLODIPine (NORVASC) 10 MG tablet TAKE 1 TABLET(10 MG) BY MOUTH EVERY DAY 30 tablet 11 aspirin 81 MG EC tablet Take 81 mg by mouth once daily. azelastine (ASTELIN) 137 mcg nasal spray Place 2 sprays into both nostrils 2 (two) times daily 30 mL 5 butalbital-acetaminophen-caffeine (FIORICET) 50-325-40 mg tablet Take 1 tablet by mouth every 4 (four) hours as needed for Pain cetirizine (ZYRTEC) 10 MG tablet Take 10 mg by mouth once daily. clotrimazole-betamethasone (LOTRISONE) 1-0.05 % cream cyclobenzaprine (FLEXERIL) 10 MG tablet escitalopram oxalate (LEXAPRO) 20 MG tablet TAKE 1 TABLET(20 MG) BY MOUTH EVERY DAY 30 tablet 11 ferrous sulfate 325 (65 FE) MG tablet Take 325 mg by mouth daily with breakfast fluticasone propionate (FLONASE) 50 mcg/actuation nasal spray Place 2 sprays into both nostrils once daily as needed for Rhinitis 16 g 5 gabapentin (NEURONTIN) 300 MG capsule Take 1 capsule (300 mg total) by mouth 3 (three) times daily 90 capsule 1 ibuprofen (MOTRIN) 200 MG tablet Take 200 mg by mouth every 6 (six) hours as needed lamoTRIgine (LAMICTAL) 100 MG tablet Take 1 tablet (100 mg total) by mouth 2 (two) times daily for 180 days 180 tablet 1 lidocaine-diphenhydramine-aluminum-magnesium-simethicone (FIRST MOUTHWASH BLM) oral suspension Swish and spit 5 mLs every 6 (six) hours as needed 237 mL 0 lovastatin (MEVACOR) 40 MG tablet TAKE 1 TABLET(40 MG) BY MOUTH DAILY WITH DINNER 90 tablet 3 meloxicam (MOBIC) 15 MG tablet Take 1 tablet (15 mg total) by mouth once daily 90 tablet 0 omeprazole (PRILOSEC) 20 MG DR capsule TAKE 1 CAPSULE(20 MG) BY MOUTH EVERY DAY 30 capsule 11 ondansetron (ZOFRAN) 4 MG tablet Take 2 tablets (8 mg total) by  mouth every 8 (eight) hours as needed for Nausea for up to 45 doses 45 tablet 2 tiotropium bromide (SPIRIVA RESPIMAT) 2.5 mcg/actuation inhalation spray Inhale 2 inhalations (5 mcg total) into the lungs once daily 4 g 11 varenicline (CHANTIX) 0.5 MG tablet 1 tablet by mouth twice a day for 12 weeks. 60 tablet 2 nitroGLYcerin (NITROSTAT) 0.4 MG SL tablet Place 1 tablet (0.4 mg total) under the tongue every 5 (five) minutes as needed for Chest pain May take up to 3 doses. 25 tablet 11  No current facility-administered medications for this visit.  Allergies: Allergies Allergen Reactions Latex, Natural Rubber Rash Pollen Extracts Other (See Comments)   Body mass index is 19.73 kg/m.  Review of Systems: A comprehensive 14 point ROS was performed, reviewed, and the pertinent orthopaedic findings are documented in the HPI.  There were no vitals filed for this visit.  General Physical Examination:  General/Constitutional: No apparent distress: well-nourished and well developed. Eyes: Pupils equal, round with synchronous movement. Lungs: Clear to auscultation HEENT: Normal Vascular: No edema, swelling or tenderness, except as noted in detailed exam. Cardiac: Heart rate and rhythm is regular. Integumentary: No impressive skin lesions present, except as noted in detailed exam. Neuro/Psych: Normal mood and affect, oriented to person, place, and time.  Musculoskeletal Examination: Positive Phalen's and Tinel's on the left with some thenar atrophy.  Radiographs:  No new imaging studies were obtained or reviewed today.  Assessment: ICD-10-CM 1. S/P carpal tunnel release Z98.890 2. Carpal tunnel syndrome of right wrist G56.01 3. Primary osteoarthritis of right hand M19.041  Plan:  The patient has clinical findings of post carpal tunnel release status. It has been 4 months since her surgery, and full nerve healing can be achieved in up to 1 year. I explained to the patient that she  will likely experience pain for another 6 months or so. She was advised to persist with scar massage to soften the scar tissue. Should she decide to proceed with surgery on her left hand, it will be scheduled for her next month.  Document Attestation: Eilene Ghazi, have reviewed and updated documentation for [facility], MD, utilizing [employer]. Electronically signed by Marlena Clipper, MD at 01/09/2023 8:21 AM EDT Patient has elected to proceed with left carpal tunnel release.  Reviewed  H+P. No changes noted.

## 2023-01-27 NOTE — Discharge Instructions (Signed)
Expect hand to be numb for 6-8 hours Keep arm elevated is much as possible until recheck Work on finger motion all you can Use hand as tolerated Keep dressing clean and dry Loosen Ace wrap prior to dismissal and a finger swell Call office if you are having any problems [phone]

## 2023-01-27 NOTE — Anesthesia Postprocedure Evaluation (Signed)
Anesthesia Post Note  Patient: Tonya Keith  Procedure(s) Performed: CARPAL TUNNEL RELEASE (Left: Wrist)  Patient location during evaluation: PACU Anesthesia Type: General Level of consciousness: awake and alert Pain management: pain level controlled Vital Signs Assessment: post-procedure vital signs reviewed and stable Respiratory status: spontaneous breathing, nonlabored ventilation, respiratory function stable and patient connected to nasal cannula oxygen Cardiovascular status: blood pressure returned to baseline and stable Postop Assessment: no apparent nausea or vomiting Anesthetic complications: no   No notable events documented.   Last Vitals:  Vitals:   01/27/23 0926 01/27/23 0930  BP:  138/70  Pulse: 80 71  Resp: 19 15  Temp:    SpO2: 99% 94%    Last Pain:  Vitals:   01/27/23 0930  TempSrc:   PainSc: 0-No pain                 Nichlas Pitera C Arvo Ealy

## 2023-01-27 NOTE — Transfer of Care (Signed)
Immediate Anesthesia Transfer of Care Note  Patient: Tonya Keith  Procedure(s) Performed: CARPAL TUNNEL RELEASE (Left: Wrist)  Patient Location: PACU  Anesthesia Type: General  Level of Consciousness: awake, alert  and patient cooperative  Airway and Oxygen Therapy: Patient Spontanous Breathing and Patient connected to supplemental oxygen  Post-op Assessment: Post-op Vital signs reviewed, Patient's Cardiovascular Status Stable, Respiratory Function Stable, Patent Airway and No signs of Nausea or vomiting  Post-op Vital Signs: Reviewed and stable  Complications: No notable events documented.

## 2023-01-27 NOTE — Op Note (Signed)
01/27/2023  9:14 AM  PATIENT:  Tonya Keith  62 y.o. 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  PROCEDURE:  Procedure(s): CARPAL TUNNEL RELEASE (Left)  SURGEON: Leitha Schuller, MD  ASSISTANTS: none  ANESTHESIA:   local and MAC  EBL:  Total I/O In: 300 [I.V.:300] Out: -   BLOOD ADMINISTERED:none  DRAINS: none   LOCAL MEDICATIONS USED:  MARCAINE     SPECIMEN:  No Specimen  DISPOSITION OF SPECIMEN:  N/A  COUNTS:  YES  TOURNIQUET:   Total Tourniquet Time Documented: Forearm (Left) - 11 minutes Total: Forearm (Left) - 11 minutes   IMPLANTS: None  DICTATION: .Dragon Dictation the left arm was prepped and draped in the usual sterile manner after general anesthesia was obtained with MAC and local anesthetic subsequently infiltrated 10 cc half percent Marcaine after appropriate patient identification timeout procedure.  Tourniquet was at the level of the upper forearm and raised at the start of the case with a proximately 2 cm incision made in line with the ring metacarpal with subcutaneous tissue spread the transcarpal ligament identified. The carpal tunnel was opened and a vascular hemostat placed deep to protect the underlying structures release was carried out proximally and distally with complete release there was no hourglass constriction. There was vascular blush to the nerve after release. The wound was infiltrated with 10 cc half percent Sensorcaine and the wound closed with simple and ruptured 4-0 nylon.   PLAN OF CARE: Discharge to home after PACU  PATIENT DISPOSITION:  PACU - hemodynamically stable.

## 2023-01-28 ENCOUNTER — Encounter: Payer: Self-pay | Admitting: Orthopedic Surgery

## 2023-04-17 ENCOUNTER — Encounter: Payer: Self-pay | Admitting: *Deleted

## 2023-05-27 DIAGNOSIS — G5602 Carpal tunnel syndrome, left upper limb: Secondary | ICD-10-CM

## 2023-05-27 HISTORY — DX: Carpal tunnel syndrome, left upper limb: G56.02

## 2023-06-24 ENCOUNTER — Other Ambulatory Visit: Payer: Self-pay | Admitting: Emergency Medicine

## 2023-06-24 DIAGNOSIS — F17210 Nicotine dependence, cigarettes, uncomplicated: Secondary | ICD-10-CM

## 2023-06-24 DIAGNOSIS — Z122 Encounter for screening for malignant neoplasm of respiratory organs: Secondary | ICD-10-CM

## 2023-07-01 ENCOUNTER — Ambulatory Visit
Admission: RE | Admit: 2023-07-01 | Discharge: 2023-07-01 | Disposition: A | Discharge status: Home / Self Care | Payer: 59 | Source: Ambulatory Visit | Attending: Emergency Medicine | Admitting: Emergency Medicine

## 2023-07-01 DIAGNOSIS — Z122 Encounter for screening for malignant neoplasm of respiratory organs: Secondary | ICD-10-CM | POA: Diagnosis present

## 2023-07-01 DIAGNOSIS — F17210 Nicotine dependence, cigarettes, uncomplicated: Secondary | ICD-10-CM | POA: Diagnosis present

## 2023-07-07 ENCOUNTER — Other Ambulatory Visit: Payer: Self-pay | Admitting: Obstetrics and Gynecology

## 2023-07-07 DIAGNOSIS — Z1231 Encounter for screening mammogram for malignant neoplasm of breast: Secondary | ICD-10-CM

## 2023-08-12 ENCOUNTER — Other Ambulatory Visit: Payer: Self-pay | Admitting: Surgery

## 2023-08-18 ENCOUNTER — Other Ambulatory Visit: Payer: Self-pay

## 2023-08-18 ENCOUNTER — Encounter
Admission: RE | Admit: 2023-08-18 | Discharge: 2023-08-18 | Disposition: A | Discharge status: Home / Self Care | Source: Ambulatory Visit | Attending: Surgery | Admitting: Surgery

## 2023-08-18 DIAGNOSIS — I10 Essential (primary) hypertension: Secondary | ICD-10-CM

## 2023-08-18 DIAGNOSIS — E119 Type 2 diabetes mellitus without complications: Secondary | ICD-10-CM

## 2023-08-18 DIAGNOSIS — Z01812 Encounter for preprocedural laboratory examination: Secondary | ICD-10-CM

## 2023-08-18 HISTORY — DX: Vitamin D deficiency, unspecified: E55.9

## 2023-08-18 HISTORY — DX: Type 2 diabetes mellitus without complications: E11.9

## 2023-08-18 HISTORY — DX: Unspecified osteoarthritis, unspecified site: M19.90

## 2023-08-18 HISTORY — DX: Headache, unspecified: R51.9

## 2023-08-18 HISTORY — DX: Elevated white blood cell count, unspecified: D72.829

## 2023-08-18 NOTE — Patient Instructions (Addendum)
 Your procedure is scheduled on: 08/26/23 - Wednesday Report to the Registration Desk on the 1st floor of the [facility]. To find out your arrival time, please call [phone] between 1PM - 3PM on: 08/25/23 - Tuesday If your arrival time is 6:00 am, do not arrive before that time as the [facility] entrance doors do not open until 6:00 am.  REMEMBER: Instructions that are not followed completely may result in serious medical risk, up to and including death; or upon the discretion of your surgeon and anesthesiologist your surgery may need to be rescheduled.  Do not eat food after midnight the night before surgery.  No gum chewing or hard candies.  You may however, drink CLEAR liquids up to 2 hours before you are scheduled to arrive for your surgery. Do not drink anything within 2 hours of your scheduled arrival time.  Clear liquids include: - water   In addition, your doctor has ordered for you to drink the provided:  Gatorade G2 Drinking this carbohydrate drink up to two hours before surgery helps to reduce insulin resistance and improve patient outcomes. Please complete drinking 2 hours before scheduled arrival time.  One week prior to surgery: Stop Anti-inflammatories (NSAIDS) such as Advil, Aleve, Ibuprofen, Motrin, Naproxen, Naprosyn and Aspirin based products such as Excedrin, Goody's Powder, BC Powder. You may take Tylenol if needed for pain up until the day of surgery.  Stop ANY OVER THE COUNTER supplements until after surgery.   ON THE DAY OF SURGERY ONLY TAKE THESE MEDICATIONS WITH SIPS OF WATER: NONE  Use inhalers albuterol (VENTOLIN HFA) and Tiotropium Bromide  on the day of surgery and bring to the hospital.   No Alcohol for 24 hours before or after surgery.  No Smoking including e-cigarettes for 24 hours before surgery.  No chewable tobacco products for at least 6 hours before surgery.  No nicotine patches on the day of surgery.  Do not use any "recreational"  drugs for at least a week (preferably 2 weeks) before your surgery.  Please be advised that the combination of cocaine and anesthesia may have negative outcomes, up to and including death. If you test positive for cocaine, your surgery will be cancelled.  On the morning of surgery brush your teeth with toothpaste and water, you may rinse your mouth with mouthwash if you wish. Do not swallow any toothpaste or mouthwash.  Use CHG Soap or wipes as directed on instruction sheet.  Do not wear jewelry, make-up, hairpins, clips or nail polish.  For welded (permanent) jewelry: bracelets, anklets, waist bands, etc.  Please have this removed prior to surgery.  If it is not removed, there is a chance that hospital personnel will need to cut it off on the day of surgery.  Do not wear lotions, powders, or perfumes.   Do not shave body hair from the neck down 48 hours before surgery.  Contact lenses, hearing aids and dentures may not be worn into surgery.  Do not bring valuables to the hospital. [facility] is not responsible for any missing/lost belongings or valuables.   Notify your doctor if there is any change in your medical condition (cold, fever, infection).  Wear comfortable clothing (specific to your surgery type) to the hospital.  After surgery, you can help prevent lung complications by doing breathing exercises.  Take deep breaths and cough every 1-2 hours. Your doctor may order a device called an Incentive Spirometer to help you take deep breaths. When coughing or sneezing,  hold a pillow firmly against your incision with both hands. This is called "splinting." Doing this helps protect your incision. It also decreases belly discomfort.  If you are being admitted to the hospital overnight, leave your suitcase in the car. After surgery it may be brought to your room.  In case of increased patient census, it may be necessary for you, the patient, to continue your postoperative care in the  Same Day Surgery department.  If you are being discharged the day of surgery, you will not be allowed to drive home. You will need a responsible individual to drive you home and stay with you for 24 hours after surgery.   If you are taking public transportation, you will need to have a responsible individual with you.  Please call the Pre-admissions Testing Dept. at [phone] if you have any questions about these instructions.  Surgery Visitation Policy:  Patients having surgery or a procedure may have two visitors.  Children under the age of 74 must have an adult with them who is not the patient.  Temporary Visitor Restrictions Due to increasing cases of flu, RSV and COVID-19: Children ages 25 and under will not be able to visit patients in [facility] hospitals under most circumstances.  Inpatient Visitation:    Visiting hours are 7 a.m. to 8 p.m. Up to four visitors are allowed at one time in a patient room. The visitors may rotate out with other people during the day.  One visitor age 38 or older may stay with the patient overnight and must be in the room by 8 p.m.    Preparing for Surgery with CHLORHEXIDINE GLUCONATE (CHG) Soap  Chlorhexidine Gluconate (CHG) Soap  o An antiseptic cleaner that kills germs and bonds with the skin to continue killing germs even after washing  o Used for showering the night before surgery and morning of surgery  Before surgery, you can play an important role by reducing the number of germs on your skin.  CHG (Chlorhexidine gluconate) soap is an antiseptic cleanser which kills germs and bonds with the skin to continue killing germs even after washing.  Please do not use if you have an allergy to CHG or antibacterial soaps. If your skin becomes reddened/irritated stop using the CHG.  1. Shower the NIGHT BEFORE SURGERY and the MORNING OF SURGERY with CHG soap.  2. If you choose to wash your hair, wash your hair first as usual with your  normal shampoo.  3. After shampooing, rinse your hair and body thoroughly to remove the shampoo.  4. Use CHG as you would any other liquid soap. You can apply CHG directly to the skin and wash gently with a scrungie or a clean washcloth.  5. Apply the CHG soap to your body only from the neck down. Do not use on open wounds or open sores. Avoid contact with your eyes, ears, mouth, and genitals (private parts). Wash face and genitals (private parts) with your normal soap.  6. Wash thoroughly, paying special attention to the area where your surgery will be performed.  7. Thoroughly rinse your body with warm water.  8. Do not shower/wash with your normal soap after using and rinsing off the CHG soap.  9. Pat yourself dry with a clean towel.  10. Wear clean pajamas to bed the night before surgery.  12. Place clean sheets on your bed the night of your first shower and do not sleep with pets.  13. Shower again with the  CHG soap on the day of surgery prior to arriving at the hospital.  14. Do not apply any deodorants/lotions/powders.  15. Please wear clean clothes to the hospital. How to Use an Incentive Spirometer  An incentive spirometer is a tool that measures how well you are filling your lungs with each breath. Learning to take long, deep breaths using this tool can help you keep your lungs clear and active. This may help to reverse or lessen your chance of developing breathing (pulmonary) problems, especially infection. You may be asked to use a spirometer: After a surgery. If you have a lung problem or a history of smoking. After a long period of time when you have been unable to move or be active. If the spirometer includes an indicator to show the highest number that you have reached, your health care provider or respiratory therapist will help you set a goal. Keep a log of your progress as told by your health care provider. What are the risks? Breathing too quickly may cause  dizziness or cause you to pass out. Take your time so you do not get dizzy or light-headed. If you are in pain, you may need to take pain medicine before doing incentive spirometry. It is harder to take a deep breath if you are having pain. How to use your incentive spirometer  Sit up on the edge of your bed or on a chair. Hold the incentive spirometer so that it is in an upright position. Before you use the spirometer, breathe out normally. Place the mouthpiece in your mouth. Make sure your lips are closed tightly around it. Breathe in slowly and as deeply as you can through your mouth, causing the piston or the ball to rise toward the top of the chamber. Hold your breath for 3-5 seconds, or for as long as possible. If the spirometer includes a coach indicator, use this to guide you in breathing. Slow down your breathing if the indicator goes above the marked areas. Remove the mouthpiece from your mouth and breathe out normally. The piston or ball will return to the bottom of the chamber. Rest for a few seconds, then repeat the steps 10 or more times. Take your time and take a few normal breaths between deep breaths so that you do not get dizzy or light-headed. Do this every 1-2 hours when you are awake. If the spirometer includes a goal marker to show the highest number you have reached (best effort), use this as a goal to work toward during each repetition. After each set of 10 deep breaths, cough a few times. This will help to make sure that your lungs are clear. If you have an incision on your chest or abdomen from surgery, place a pillow or a rolled-up towel firmly against the incision when you cough. This can help to reduce pain while taking deep breaths and coughing. General tips When you are able to get out of bed: Walk around often. Continue to take deep breaths and cough in order to clear your lungs. Keep using the incentive spirometer until your health care provider says it is okay  to stop using it. If you have been in the hospital, you may be told to keep using the spirometer at home. Contact a health care provider if: You are having difficulty using the spirometer. You have trouble using the spirometer as often as instructed. Your pain medicine is not giving enough relief for you to use the spirometer as told. You have  a fever. Get help right away if: You develop shortness of breath. You develop a cough with bloody mucus from the lungs. You have fluid or blood coming from an incision site after you cough. Summary An incentive spirometer is a tool that can help you learn to take long, deep breaths to keep your lungs clear and active. You may be asked to use a spirometer after a surgery, if you have a lung problem or a history of smoking, or if you have been inactive for a long period of time. Use your incentive spirometer as instructed every 1-2 hours while you are awake. If you have an incision on your chest or abdomen, place a pillow or a rolled-up towel firmly against your incision when you cough. This will help to reduce pain. Get help right away if you have shortness of breath, you cough up bloody mucus, or blood comes from your incision when you cough. This information is not intended to replace advice given to you by your health care provider. Make sure you discuss any questions you have with your health care provider. Document Revised: 08/01/2019 Document Reviewed: 08/01/2019 Elsevier Patient Education  2023 [employer].

## 2023-08-18 NOTE — Pre-Procedure Instructions (Signed)
 Patient voices that she will not accept blood products if needed. MD made aware.

## 2023-08-19 ENCOUNTER — Encounter: Payer: Self-pay | Admitting: Surgery

## 2023-08-19 NOTE — Progress Notes (Signed)
 Perioperative / Anesthesia Services  Pre-Admission Testing Clinical Review / Pre-Operative Anesthesia Consult  Date: 08/19/23  Patient Demographics:  Name: Tonya Keith DOB: [birth year] MRN:   [id number]  Planned Surgical Procedure(s):    Case: [id number] Date/Time: 08/26/23 1015   Procedure: CARPAL TUNNEL RELEASE (Left: Wrist)   Anesthesia type: Choice   Diagnosis: Carpal tunnel syndrome of left wrist [G56.02]   Pre-op diagnosis: CARPAL TUNNEL SYNDROME OF LEFT WRIST   Location: [facility] OR ROOM 02 / [facility]   Surgeons: Christena Flake, MD      NOTE: Available PAT nursing documentation and vital signs have been reviewed. Clinical nursing staff has updated patient's PMH/PSHx, current medication list, and drug allergies/intolerances to ensure comprehensive history available to assist in medical decision making as it pertains to the aforementioned surgical procedure and anticipated anesthetic course. Extensive review of available clinical information personally performed. [facility] and PSHx updated with any diagnoses/procedures that  may have been inadvertently omitted during his intake with the pre-admission testing department's nursing staff.  Clinical Discussion:  Tonya Keith is a 63 y.o. female who is submitted for pre-surgical anesthesia review and clearance prior to her undergoing the above procedure. Patient is a Current Smoker (13.5 pack years). Pertinent PMH includes: CAD, STEMI, angina, bradycardia, cardiac murmur, palpitations, aortic atherosclerosis, HTN, HLD, T2DM, COPD, OSAH (does not require nocturnal PAP therapy), cerebral tonsillar ectopia, anemia, hemoglobin C trait, remote LEFT breast cancer (s/p mastectomy), OA, BILATERAL carpal tunnel syndrome, depression memory impairment.  Patient is followed by cardiology Juliann Pares, MD). She was last seen in the cardiology clinic on ***; notes reviewed. ***At the time of her clinic visit, patient doing well overall  from a cardiovascular perspective. Patient denied any chest pain, shortness of breath, PND, orthopnea, palpitations, significant peripheral edema, weakness, fatigue, vertiginous symptoms, or presyncope/syncope. Patient with a past medical history significant for cardiovascular diagnoses. Documented physical exam was grossly benign, providing no evidence of acute exacerbation and/or decompensation of the patient's known cardiovascular conditions.  ***  Blood pressure***controlled at *** mmHg on currently prescribed *** therapies.  Patient is on *** for her HLD diagnosis and ASCVD prevention. ***Patient is not diabetic. ***She does not have an OSAH diagnosis. ***FC. No changes were made to her medication regimen during her visit with cardiology.  Patient scheduled to follow-up with outpatient cardiology in***months or sooner if needed.  Tonya Keith is scheduled for an elective CARPAL TUNNEL RELEASE (Left: Wrist) on 08/26/2023 with Dr. Leron Croak, MD. Given patient's past medical history significant for cardiovascular diagnoses, presurgical cardiac clearance was sought by the PAT team. ***  In review of the patient's chart, it is noted that she is on daily oral antithrombotic therapy. Given that patient's past medical history is significant for cardiovascular diagnoses, including but not limited to CAD, orthopedics has cleared patient to continue her daily low dose ASA throughout her perioperative course.  Patient has been updated on these directives from her specialty care providers by the PAT team.  Patient denies previous perioperative complications with anesthesia in the past. In review her EMR, it is noted that patient underwent a general anesthetic course at [facility] ([facility]) in 01/2023 without documented complications.      01/27/2023    9:45 AM 01/27/2023    9:30 AM 01/27/2023    9:26 AM  Vitals with BMI  Systolic 119 138   Diastolic 72 70   Pulse 62 71 80   Providers/Specialists:  NOTE: Primary physician provider listed below. Patient may have been seen by APP or partner within same practice.   PROVIDER ROLE / SPECIALTY LAST OV  Poggi, Excell Seltzer, MD Orthopedics (Surgeon) 07/10/2023  Rayetta Humphrey, MD Primary Care Provider 06/18/2023  Rudean Hitt, MD Cardiology 06/30/2023  Ned Clines, MD Pulmonary Medicine 07/27/2023  Cristopher Peru, MD Neurology 07/06/2023   Allergies:   Allergies  Allergen Reactions   Ace Inhibitors Cough and Other (See Comments)   Pollen Extract    Latex Itching and Rash   Current Home Medications:   No current facility-administered medications for this encounter.    albuterol (VENTOLIN HFA) 108 (90 Base) MCG/ACT inhaler   alendronate (FOSAMAX) 70 MG tablet   amLODipine (NORVASC) 10 MG tablet   amoxicillin-clavulanate (AUGMENTIN) 500-125 MG tablet   aspirin EC 81 MG tablet   azelastine (ASTELIN) 0.1 % nasal spray   cetirizine (ZYRTEC) 10 MG tablet   escitalopram (LEXAPRO) 20 MG tablet   gabapentin (NEURONTIN) 300 MG capsule   ipratropium (ATROVENT) 0.03 % nasal spray   isosorbide mononitrate (IMDUR) 30 MG 24 hr tablet   lamoTRIgine (LAMICTAL) 100 MG tablet   lovastatin (MEVACOR) 40 MG tablet   metoprolol succinate (TOPROL-XL) 100 MG 24 hr tablet   naproxen (NAPROSYN) 500 MG tablet   nitroGLYCERIN (NITROSTAT) 0.4 MG SL tablet   pregabalin (LYRICA) 75 MG capsule   Tiotropium Bromide Monohydrate 2.5 MCG/ACT AERS   Vitamin D, Ergocalciferol, (DRISDOL) 1.25 MG (50000 UNIT) CAPS capsule   HYDROcodone-acetaminophen (NORCO/VICODIN) 5-325 MG tablet   History:   Past Medical History:  Diagnosis Date   Anemia    Anginal pain (HCC)    Arthritis    Bilateral carpal tunnel syndrome    Bradycardia    Breast cancer, left (HCC) 2008   a.) s/p surgical resection (mastectomy) + systemic chemotherapy   Cardiac murmur    COPD (chronic obstructive pulmonary disease) (HCC)    Coronary artery disease    a.) LHC 08/09/2007: 100% mRCA  (2.25 x 23 mm Mini Vision BMS)   Depression    H. pylori infection    Hemoglobin C trait (HCC)    HLD (hyperlipidemia)    Hx of migraine headaches    Hypertension    Long-term use of aspirin therapy    Memory impairment    Osteopenia    a.) on oral bisphosphonate (alendronate)   Palpitations    Personal history of chemotherapy    Postural hypotension    Smoker    STEMI involving right coronary artery (HCC) 08/09/2007   a.) LHC/PCI 08/09/2007: 100% mRCA (2.5 x 23 mm Mini Vision BMS)   T2DM (type 2 diabetes mellitus) (HCC)    Trichomoniasis    Vitamin D insufficiency    Wears dentures    full upper (currently unable to wear)   Past Surgical History:  Procedure Laterality Date   ABDOMINAL HYSTERECTOMY     ANKLE FRACTURE SURGERY Right    CARPAL TUNNEL RELEASE Left 01/27/2023   Procedure: CARPAL TUNNEL RELEASE;  Surgeon: Kennedy Bucker, MD;  Location: [facility];  Service: Orthopedics;  Laterality: Left;   carpel tunnel release Right    CORONARY ANGIOPLASTY WITH STENT PLACEMENT Left 08/09/2007   Procedure: CORONARY ANGIOPLASTY WITH STENT PLACEMENT; Location: [facility]; Surgeon: Rudean Hitt, MD   LEFT HEART CATH AND CORONARY ANGIOGRAPHY Left 09/07/2019   Procedure: LEFT HEART CATH AND CORONARY ANGIOGRAPHY;  Surgeon: Alwyn Pea, MD;  Location: [facility];  Service: Cardiovascular;  Laterality: Left;   LEFT HEART CATH AND CORONARY ANGIOGRAPHY Left 06/12/2010   Procedure: LEFT HEART CATH AND CORONARY ANGIOGRAPHY; Location: [facility]; Surgeon: Rudean Hitt, MD   LESION EXCISION WITH COMPLEX REPAIR Right 12/04/2020   Procedure: Excision right upper lip intraoral lesion;  Surgeon: Geanie Logan, MD;  Location: [facility];  Service: ENT;  Laterality: Right;  Latex   MASTECTOMY Left 2008   MINOR EXCISION OF ORAL LESION N/A 10/02/2020   Procedure: excision of right upper lip mass, intraoral;  Surgeon: Geanie Logan, MD;  Location: [facility];   Service: ENT;  Laterality: N/A;  Latex   WISDOM TOOTH EXTRACTION     Family History  Problem Relation Age of Onset   Breast cancer Maternal Aunt    Heart disease Father    Hypertension Father    Cancer Sister    Social History   Tobacco Use   Smoking status: Every Day    Current packs/day: 0.50    Average packs/day: 0.5 packs/day for 27.0 years (13.5 ttl pk-yrs)    Types: Cigarettes   Smokeless tobacco: Former    Types: Snuff   Tobacco comments:    since age 50  Substance Use Topics   Alcohol use: Not Currently   Pertinent Clinical Results:  LABS:  Lab Results  Component Value Date   WBC 13.7 (H) 01/13/2020   HGB 14.3 01/13/2020   HCT 40.1 01/13/2020   MCV 76.7 (L) 01/13/2020   PLT 238 01/13/2020   Lab Results  Component Value Date   NA 140 09/22/2022   CL 105 09/22/2022   K 4.5 09/22/2022   CO2 25 01/13/2020   BUN 16 09/22/2022   CREATININE 0.77 09/22/2022   EGFR 86 09/22/2022   CALCIUM 9.3 09/22/2022   ALBUMIN 4.4 09/22/2022   GLUCOSE 86 09/22/2022    ECG: Date: ***  Time ECG obtained: *** {Time; am/pm:31393} Rate: *** bpm Rhythm: {CHL RHYTHM BASELINE EKG FOR XLK:44010272} Axis (leads I and aVF): {QRS AXIS:22036} Intervals: PR *** ms. QRS *** ms. QTc *** ms. ST segment and T wave changes: No evidence of acute T wave abnormalities or significant ST segment elevation or depression.  Evidence of a possible, age undetermined, prior infarct:  {yes/no:20286}; {MISC; CAR VENTRICULAR VEINS:19806} Comparison: Similar to previous tracing obtained on *** ***No prior ECG tracings on file for review/comparison.   IMAGING / PROCEDURES: CT CHEST LUNG CA SCREEN LOW DOSE W/O CM performed on 07/01/2023 Lung-RADS 2, benign appearance or behavior. Continue annual screening with low-dose chest CT without contrast in 12 months. Age advanced three-vessel coronary artery calcification. Aortic atherosclerosis  Emphysema  MYOCARDIAL PERFUSION IMAGING STUDY (LEXISCAN)  performed on 12/27/2020 Normal left ventricular systolic function with a normal LVEF of 54% Normal myocardial thickening and wall motion Left ventricular cavity size normal SPECT images demonstrate homogenous tracer distribution throughout the myocardium TID ratio = 1.15 No evidence of stress-induced myocardial ischemia or arrhythmia Normal low risk study  TRANSTHORACIC ECHOCARDIOGRAM performed on 12/27/2020 Low normal left ventricular systolic function with an EF of 50% No regional wall motion abnormalities Right ventricular size and function normal Trivial mitral and tricuspid valve regurgitation Normal gradients; no valvular stenosis No pericardial effusion  LEFT HEART CATHETERIZATION AND CORONARY ANGIOGRAPHY performed on 09/07/2019 Normal left ventricular systolic function Multivessel CAD 70% proximal LCx 10% proximal to mid RCA Widely patent RCA stent Recommendations: medical therapy for now   Impression and Plan:  Tonya Keith has been referred for pre-anesthesia review and clearance prior  to her undergoing the planned anesthetic and procedural courses. Available labs, pertinent testing, and imaging results were personally reviewed by me in preparation for upcoming operative/procedural course. The [facility] medical record has been updated following extensive record review and patient interview with PAT staff.   ATTENTION --> PENDING CLEARANCE AT THIS TIME -- NOTE/CONTENTS NOT FINAL UNTIL SIGNED This patient has been appropriately cleared by cardiology with an overall *** risk of experiencing significant perioperative cardiovascular complications. Based on clinical review performed today (08/19/23), barring any significant acute changes in the patient's overall condition, it is anticipated that she will be able to proceed with the planned surgical intervention. Any acute changes in clinical condition may necessitate her procedure being postponed and/or cancelled. Patient will meet  with anesthesia team (MD and/or CRNA) on the day of her procedure for preoperative evaluation/assessment. Questions regarding anesthetic course will be fielded at that time.   Pre-surgical instructions were reviewed with the patient during his PAT appointment, and questions were fielded to satisfaction by PAT clinical staff. She has been instructed on which medications that she will need to hold prior to surgery, as well as the ones that have been deemed safe/appropriate to take on the day of his procedure. As part of the general education provided by PAT, patient made aware both verbally and in writing, that she would need to abstain from the use of any illegal substances during his perioperative course. She was advised that failure to follow the provided instructions could necessitate case cancellation or result in serious perioperative complications up to and including death. Patient encouraged to contact PAT and/or her surgeon's office to discuss any questions or concerns that may arise prior to surgery; verbalized understanding.   Quentin Mulling, MSN, APRN, FNP-C, CEN [facility]  Perioperative Services Nurse Practitioner Phone: [phone] Fax: [phone] 08/19/23 1:59 PM  NOTE: This note has been prepared using Dragon dictation software. Despite my best ability to proofread, there is always the potential that unintentional transcriptional errors may still occur from this process.

## 2023-08-20 ENCOUNTER — Encounter: Payer: Self-pay | Admitting: Surgery

## 2023-08-20 ENCOUNTER — Encounter
Admission: RE | Admit: 2023-08-20 | Discharge: 2023-08-20 | Disposition: A | Discharge status: Home / Self Care | Source: Ambulatory Visit | Attending: Surgery | Admitting: Surgery

## 2023-08-20 DIAGNOSIS — Z01818 Encounter for other preprocedural examination: Secondary | ICD-10-CM | POA: Insufficient documentation

## 2023-08-20 DIAGNOSIS — E119 Type 2 diabetes mellitus without complications: Secondary | ICD-10-CM | POA: Diagnosis not present

## 2023-08-20 DIAGNOSIS — I10 Essential (primary) hypertension: Secondary | ICD-10-CM | POA: Insufficient documentation

## 2023-08-20 DIAGNOSIS — R9431 Abnormal electrocardiogram [ECG] [EKG]: Secondary | ICD-10-CM | POA: Insufficient documentation

## 2023-08-20 DIAGNOSIS — Z01812 Encounter for preprocedural laboratory examination: Secondary | ICD-10-CM

## 2023-08-20 LAB — BASIC METABOLIC PANEL WITH GFR
Anion gap: 8 (ref 5–15)
BUN: 10 mg/dL (ref 8–23)
CO2: 27 mmol/L (ref 22–32)
Calcium: 9.2 mg/dL (ref 8.9–10.3)
Chloride: 104 mmol/L (ref 98–111)
Creatinine, Ser: 0.81 mg/dL (ref 0.44–1.00)
GFR, Estimated: >60 mL/min (ref >60)
Glucose, Bld: 122 mg/dL — ABNORMAL HIGH (ref 70–99)
Potassium: 3.7 mmol/L (ref 3.5–5.1)
Sodium: 139 mmol/L (ref 135–145)

## 2023-08-20 LAB — CBC
HCT: 38.7 % (ref 36.0–46.0)
Hemoglobin: 13.6 g/dL (ref 12.0–15.0)
MCH: 26.7 pg (ref 26.0–34.0)
MCHC: 35.1 g/dL (ref 30.0–36.0)
MCV: 76 fL — ABNORMAL LOW (ref 80.0–100.0)
Platelets: 221 10*3/uL (ref 150–400)
RBC: 5.09 MIL/uL (ref 3.87–5.11)
RDW: 14 % (ref 11.5–15.5)
WBC: 9.8 10*3/uL (ref 4.0–10.5)
nRBC: 0 % (ref 0.0–0.2)

## 2023-08-26 ENCOUNTER — Encounter
Admission: RE | Disposition: A | Discharge status: Home / Self Care | Payer: Self-pay | Source: Home / Self Care | Attending: Surgery

## 2023-08-26 ENCOUNTER — Other Ambulatory Visit: Payer: Self-pay

## 2023-08-26 ENCOUNTER — Encounter: Payer: Self-pay | Admitting: Surgery

## 2023-08-26 ENCOUNTER — Ambulatory Visit
Admission: RE | Admit: 2023-08-26 | Discharge: 2023-08-26 | Disposition: A | Discharge status: Home / Self Care | Attending: Surgery | Admitting: Surgery

## 2023-08-26 ENCOUNTER — Ambulatory Visit: Payer: Self-pay | Admitting: Urgent Care

## 2023-08-26 DIAGNOSIS — Z9012 Acquired absence of left breast and nipple: Secondary | ICD-10-CM | POA: Diagnosis not present

## 2023-08-26 DIAGNOSIS — I25119 Atherosclerotic heart disease of native coronary artery with unspecified angina pectoris: Secondary | ICD-10-CM | POA: Insufficient documentation

## 2023-08-26 DIAGNOSIS — E7800 Pure hypercholesterolemia, unspecified: Secondary | ICD-10-CM | POA: Diagnosis not present

## 2023-08-26 DIAGNOSIS — Z955 Presence of coronary angioplasty implant and graft: Secondary | ICD-10-CM | POA: Diagnosis not present

## 2023-08-26 DIAGNOSIS — R011 Cardiac murmur, unspecified: Secondary | ICD-10-CM | POA: Diagnosis not present

## 2023-08-26 DIAGNOSIS — J449 Chronic obstructive pulmonary disease, unspecified: Secondary | ICD-10-CM | POA: Diagnosis not present

## 2023-08-26 DIAGNOSIS — I252 Old myocardial infarction: Secondary | ICD-10-CM | POA: Diagnosis not present

## 2023-08-26 DIAGNOSIS — G709 Myoneural disorder, unspecified: Secondary | ICD-10-CM | POA: Diagnosis not present

## 2023-08-26 DIAGNOSIS — G5602 Carpal tunnel syndrome, left upper limb: Secondary | ICD-10-CM | POA: Insufficient documentation

## 2023-08-26 DIAGNOSIS — Z79899 Other long term (current) drug therapy: Secondary | ICD-10-CM | POA: Diagnosis not present

## 2023-08-26 DIAGNOSIS — Z5986 Financial insecurity: Secondary | ICD-10-CM | POA: Insufficient documentation

## 2023-08-26 DIAGNOSIS — Z01812 Encounter for preprocedural laboratory examination: Secondary | ICD-10-CM

## 2023-08-26 DIAGNOSIS — D649 Anemia, unspecified: Secondary | ICD-10-CM | POA: Diagnosis not present

## 2023-08-26 DIAGNOSIS — Z8249 Family history of ischemic heart disease and other diseases of the circulatory system: Secondary | ICD-10-CM | POA: Insufficient documentation

## 2023-08-26 DIAGNOSIS — E1141 Type 2 diabetes mellitus with diabetic mononeuropathy: Secondary | ICD-10-CM | POA: Diagnosis not present

## 2023-08-26 DIAGNOSIS — F32A Depression, unspecified: Secondary | ICD-10-CM | POA: Insufficient documentation

## 2023-08-26 DIAGNOSIS — I10 Essential (primary) hypertension: Secondary | ICD-10-CM | POA: Insufficient documentation

## 2023-08-26 DIAGNOSIS — F17210 Nicotine dependence, cigarettes, uncomplicated: Secondary | ICD-10-CM | POA: Insufficient documentation

## 2023-08-26 DIAGNOSIS — J432 Centrilobular emphysema: Secondary | ICD-10-CM | POA: Diagnosis not present

## 2023-08-26 DIAGNOSIS — Z853 Personal history of malignant neoplasm of breast: Secondary | ICD-10-CM | POA: Diagnosis not present

## 2023-08-26 HISTORY — PX: CARPAL TUNNEL RELEASE: SHX101

## 2023-08-26 HISTORY — DX: Depression, unspecified: F32.A

## 2023-08-26 HISTORY — DX: Long term (current) use of aspirin: Z79.82

## 2023-08-26 HISTORY — DX: Obstructive sleep apnea (adult) (pediatric): G47.33

## 2023-08-26 HISTORY — DX: Type 2 diabetes mellitus without complications: E11.9

## 2023-08-26 HISTORY — DX: Palpitations: R00.2

## 2023-08-26 HISTORY — DX: Other specified bacterial intestinal infections: A04.8

## 2023-08-26 HISTORY — DX: Compression of brain: G93.5

## 2023-08-26 HISTORY — DX: Cannabis use, unspecified, uncomplicated: F12.90

## 2023-08-26 HISTORY — DX: Orthostatic hypotension: I95.1

## 2023-08-26 HISTORY — DX: Atherosclerosis of aorta: I70.0

## 2023-08-26 HISTORY — DX: Bradycardia, unspecified: R00.1

## 2023-08-26 HISTORY — DX: Other specified disorders of bone density and structure, unspecified site: M85.80

## 2023-08-26 HISTORY — DX: Hyperlipidemia, unspecified: E78.5

## 2023-08-26 HISTORY — DX: Other hemoglobinopathies: D58.2

## 2023-08-26 HISTORY — DX: Cardiac murmur, unspecified: R01.1

## 2023-08-26 HISTORY — DX: Nicotine dependence, unspecified, uncomplicated: F17.200

## 2023-08-26 HISTORY — DX: Trichomoniasis, unspecified: A59.9

## 2023-08-26 HISTORY — DX: Pulmonary fibrosis, unspecified: J84.10

## 2023-08-26 HISTORY — DX: Carpal tunnel syndrome, bilateral upper limbs: G56.03

## 2023-08-26 LAB — GLUCOSE, CAPILLARY
Glucose-Capillary: 132 mg/dL — ABNORMAL HIGH (ref 70–99)
Glucose-Capillary: 118 mg/dL — ABNORMAL HIGH (ref 70–99)

## 2023-08-26 SURGERY — CARPAL TUNNEL RELEASE
Anesthesia: General | Site: Wrist | Laterality: Left

## 2023-08-26 MED ORDER — SODIUM CHLORIDE 0.9% FLUSH: 3.0000 mL | INTRAVENOUS | Status: DC | PRN

## 2023-08-26 MED ORDER — SODIUM CHLORIDE 0.9% FLUSH: 3.0000 mL | Freq: Two times a day (BID) | INTRAVENOUS | Status: DC

## 2023-08-26 MED ORDER — EPHEDRINE SULFATE-NACL 50-0.9 MG/10ML-% IV SOSY
PREFILLED_SYRINGE | INTRAVENOUS | Status: DC | PRN
  Administered 2023-08-26: 10 mg via INTRAVENOUS

## 2023-08-26 MED ORDER — DEXAMETHASONE SODIUM PHOSPHATE 10 MG/ML IJ SOLN
INTRAMUSCULAR | Status: DC | PRN
  Administered 2023-08-26: 10 mg via INTRAVENOUS

## 2023-08-26 MED ORDER — CHLORHEXIDINE GLUCONATE 0.12 % MT SOLN
15.0000 mL | Freq: Once | OROMUCOSAL | Status: AC
  Administered 2023-08-26: 15 mL via OROMUCOSAL

## 2023-08-26 MED ORDER — KETOROLAC TROMETHAMINE 15 MG/ML IJ SOLN: 15.0000 mg | Freq: Once | INTRAMUSCULAR | Status: DC

## 2023-08-26 MED ORDER — 0.9 % SODIUM CHLORIDE (POUR BTL) OPTIME
TOPICAL | Status: DC | PRN
  Administered 2023-08-26: 500 mL

## 2023-08-26 MED ORDER — CEFAZOLIN SODIUM-DEXTROSE 2-4 GM/100ML-% IV SOLN
2.0000 g | INTRAVENOUS | Status: AC
Start: 2023-08-26 — End: 2023-08-26
  Administered 2023-08-26: 2 g via INTRAVENOUS

## 2023-08-26 MED ORDER — CEFAZOLIN SODIUM-DEXTROSE 2-4 GM/100ML-% IV SOLN
INTRAVENOUS | Status: AC
  Filled 2023-08-26: qty 100

## 2023-08-26 MED ORDER — DEXAMETHASONE SODIUM PHOSPHATE 10 MG/ML IJ SOLN
INTRAMUSCULAR | Status: AC
  Filled 2023-08-26: qty 1

## 2023-08-26 MED ORDER — SODIUM CHLORIDE 0.9 % IV SOLN: INTRAVENOUS | Status: DC | PRN

## 2023-08-26 MED ORDER — MIDAZOLAM HCL 2 MG/2ML IJ SOLN
INTRAMUSCULAR | Status: AC
  Filled 2023-08-26: qty 2

## 2023-08-26 MED ORDER — PHENYLEPHRINE 80 MCG/ML (10ML) SYRINGE FOR IV PUSH (FOR BLOOD PRESSURE SUPPORT)
PREFILLED_SYRINGE | INTRAVENOUS | Status: DC | PRN
  Administered 2023-08-26 (×4): 80 ug via INTRAVENOUS

## 2023-08-26 MED ORDER — PROPOFOL 10 MG/ML IV BOLUS
INTRAVENOUS | Status: DC | PRN
  Administered 2023-08-26: 100 mg via INTRAVENOUS
  Administered 2023-08-26: 50 mg via INTRAVENOUS
  Administered 2023-08-26: 20 mg via INTRAVENOUS

## 2023-08-26 MED ORDER — ACETAMINOPHEN 10 MG/ML IV SOLN
INTRAVENOUS | Status: AC
  Filled 2023-08-26: qty 100

## 2023-08-26 MED ORDER — PHENYLEPHRINE 80 MCG/ML (10ML) SYRINGE FOR IV PUSH (FOR BLOOD PRESSURE SUPPORT)
PREFILLED_SYRINGE | INTRAVENOUS | Status: AC
Start: 2023-08-26 — End: ?
  Filled 2023-08-26: qty 10

## 2023-08-26 MED ORDER — ORAL CARE MOUTH RINSE: 15.0000 mL | Freq: Once | OROMUCOSAL | Status: AC

## 2023-08-26 MED ORDER — BUPIVACAINE HCL (PF) 0.5 % IJ SOLN
INTRAMUSCULAR | Status: AC
Start: 2023-08-26 — End: ?
  Filled 2023-08-26: qty 30

## 2023-08-26 MED ORDER — PROPOFOL 10 MG/ML IV BOLUS
INTRAVENOUS | Status: AC
  Filled 2023-08-26: qty 20

## 2023-08-26 MED ORDER — ONDANSETRON HCL 4 MG/2ML IJ SOLN
INTRAMUSCULAR | Status: AC
  Filled 2023-08-26: qty 2

## 2023-08-26 MED ORDER — LIDOCAINE HCL (CARDIAC) PF 100 MG/5ML IV SOSY
PREFILLED_SYRINGE | INTRAVENOUS | Status: DC | PRN
Start: 2023-08-26 — End: 2023-08-26
  Administered 2023-08-26: 100 mg via INTRAVENOUS

## 2023-08-26 MED ORDER — BUPIVACAINE HCL (PF) 0.5 % IJ SOLN
INTRAMUSCULAR | Status: DC | PRN
  Administered 2023-08-26: 10 mL

## 2023-08-26 MED ORDER — MIDAZOLAM HCL 2 MG/2ML IJ SOLN
INTRAMUSCULAR | Status: DC | PRN
  Administered 2023-08-26: 2 mg via INTRAVENOUS

## 2023-08-26 MED ORDER — LIDOCAINE HCL (PF) 2 % IJ SOLN
INTRAMUSCULAR | Status: AC
  Filled 2023-08-26: qty 5

## 2023-08-26 MED ORDER — ONDANSETRON HCL 4 MG/2ML IJ SOLN
INTRAMUSCULAR | Status: DC | PRN
  Administered 2023-08-26: 4 mg via INTRAVENOUS

## 2023-08-26 MED ORDER — CHLORHEXIDINE GLUCONATE 0.12 % MT SOLN
OROMUCOSAL | Status: AC
  Filled 2023-08-26: qty 15

## 2023-08-26 MED ORDER — HYDROCODONE-ACETAMINOPHEN 5-325 MG PO TABS
1.0000 | ORAL_TABLET | ORAL | 0 refills | Status: AC | PRN
Start: 2023-08-26 — End: 2024-08-25

## 2023-08-26 MED ORDER — SUGAMMADEX SODIUM 200 MG/2ML IV SOLN
INTRAVENOUS | Status: AC
  Filled 2023-08-26: qty 2

## 2023-08-26 SURGICAL SUPPLY — 27 items
BNDG ELASTIC 2INX 5YD STR LF (GAUZE/BANDAGES/DRESSINGS) ×1 IMPLANT
BNDG ELASTIC 3INX 5YD STR LF (GAUZE/BANDAGES/DRESSINGS) ×1 IMPLANT
BNDG ESMARCH 4X12 STRL LF (GAUZE/BANDAGES/DRESSINGS) ×1 IMPLANT
CHLORAPREP W/TINT 26 (MISCELLANEOUS) ×1 IMPLANT
CORD BIP STRL DISP 12FT (MISCELLANEOUS) ×1 IMPLANT
CUFF TOURN SGL QUICK 18X4 (TOURNIQUET CUFF) IMPLANT
DRAPE SURG 17X11 SM STRL (DRAPES) ×2 IMPLANT
FORCEPS JEWEL BIP 4-3/4 STR (INSTRUMENTS) ×1 IMPLANT
GAUZE SPONGE 4X4 12PLY STRL (GAUZE/BANDAGES/DRESSINGS) ×1 IMPLANT
GAUZE XEROFORM 1X8 LF (GAUZE/BANDAGES/DRESSINGS) ×1 IMPLANT
GLOVE BIO SURGEON STRL SZ8 (GLOVE) ×2 IMPLANT
GLOVE INDICATOR 8.0 STRL GRN (GLOVE) ×1 IMPLANT
GOWN STRL REUS W/ TWL LRG LVL3 (GOWN DISPOSABLE) ×1 IMPLANT
GOWN STRL REUS W/ TWL XL LVL3 (GOWN DISPOSABLE) ×1 IMPLANT
KIT TURNOVER KIT A (KITS) ×1 IMPLANT
MANIFOLD NEPTUNE II (INSTRUMENTS) ×1 IMPLANT
NDL HYPO 25X1 1.5 SAFETY (NEEDLE) ×1 IMPLANT
NEEDLE HYPO 25X1 1.5 SAFETY (NEEDLE) ×1 IMPLANT
NS IRRIG 500ML POUR BTL (IV SOLUTION) ×1 IMPLANT
PACK EXTREMITY ARMC (MISCELLANEOUS) ×1 IMPLANT
SPLINT WRIST LG LT TX990309 (SOFTGOODS) IMPLANT
SPLINT WRIST M LT TX990308 (SOFTGOODS) IMPLANT
SPLINT WRIST M RT TX990303 (SOFTGOODS) IMPLANT
STOCKINETTE IMPERVIOUS 9X36 MD (GAUZE/BANDAGES/DRESSINGS) ×1 IMPLANT
SUT PROLENE 4 0 PS 2 18 (SUTURE) ×1 IMPLANT
TRAP FLUID SMOKE EVACUATOR (MISCELLANEOUS) ×1 IMPLANT
WATER STERILE IRR 500ML POUR (IV SOLUTION) ×1 IMPLANT

## 2023-08-26 NOTE — Transfer of Care (Signed)
 Immediate Anesthesia Transfer of Care Note  Patient: Tonya Keith  Procedure(s) Performed: CARPAL TUNNEL RELEASE (Left: Wrist)  Patient Location: PACU  Anesthesia Type:General  Level of Consciousness: drowsy  Airway & Oxygen Therapy: Patient Spontanous Breathing and Patient connected to face mask oxygen  Post-op Assessment: Report given to RN and Post -op Vital signs reviewed and stable  Post vital signs: Reviewed and stable  Last Vitals:  Vitals Value Taken Time  BP 123/70 08/26/23 1230  Temp 36.1 1231  Pulse 87 08/26/23 1230  Resp 16 08/26/23 1230  SpO2 100 % 08/26/23 1230  Vitals shown include unfiled device data.  Last Pain:  Vitals:   08/26/23 0912  PainSc: 0-No pain      Patients Stated Pain Goal: 0 (08/26/23 0912)  Complications: No notable events documented.

## 2023-08-26 NOTE — Op Note (Signed)
 08/26/2023  12:48 PM  Patient:   Tonya Keith  Pre-Op Diagnosis:   Recurrent left carpal tunnel syndrome.  Post-Op Diagnosis:   Same.  Procedure:   Open left carpal tunnel release.  Surgeon:   Maryagnes Amos, MD  Anesthesia:   General LMA  Findings:   As above.  Complications:   None  EBL:   1 cc  Fluids:   500 cc crystalloid  TT:   28 minutes at 250 mmHg  Drains:   None  Closure:   4-0 Prolene interrupted sutures  Brief Clinical Note:   The patient is a 63 year old female who has noted continued pain and paresthesias to her left hand now nearly 1 year status post a mini-open carpal tunnel release done in the office by Dr. [name]. A repeat EMG/NCV has confirmed the presence of recurrent/persistent carpal tunnel syndrome. The patient presents at this time for an open left carpal tunnel release.   Procedure:   The patient was brought into the operating room and lain in the supine position. After adequate general laryngeal mask anesthesia was achieved, the left hand and upper extremity were prepped with ChloraPrep solution before being draped sterilely. Preoperative antibiotics were administered. A timeout was performed to verify the appropriate surgical site before the limb was exsanguinated with an Esmarch and the tourniquet inflated to 250 mmHg.   Utilizing the previous incision on the volar aspect of the palm, the incision was extended proximally in a zigzag fashion over the wrist flexor crease into the distal forearm. The incision was carried down through the subcutaneous tissues with care taken to identify and protect any neurovascular structures. The distal forearm fascia was penetrated just proximal to the transverse carpal ligament. The soft tissues were released off the superficial and deep surfaces of the distal forearm fascia and this was released proximally for 2 cm under direct visualization. The nerve was visualized directly before a Therapist, nutritional was passed beneath the  transverse carpal ligament along the ulnar aspect of the carpal tunnel and used to release any adhesions as well as to remove any adherent synovial tissue. It also was used to protect the underlying nerve as the recurrent scar tissue and residual transverse carpal ligament was transected in line with the incision. Several adhesions along the ulnar side of the nerve were released to allow the nerve to move more freely within the carpal tunnel. Hemostasis was achieved using bipolar electrocautery.  The wound was irrigated thoroughly with sterile saline solution before being closed using 4-0 Prolene interrupted sutures. A total of 10 cc of 0.5% plain Sensorcaine was injected in and around the incision before a sterile bulky dressing was applied to the wound. The patient was placed into a volar wrist splint before being awakened, extubated, and returned to the recovery room in satisfactory condition after tolerating the procedure well.

## 2023-08-26 NOTE — H&P (Signed)
 History of Present Illness: The patient is a 63 year old female who presented for a history and physical for an upcoming left open carpal tunnel release to be done by Dr. Joice Lofts on August 26, 2023. The patient had a left carpal tunnel release in 01/2023. Initially, she had relief and now has had increasing symptoms. She had right carpal tunnel release previously that did have some burning pain afterwards and occupational therapy resolved that. She had a repeat nerve test that showed continued grade 2 carpal tunnel. She comes back to discuss that.  She reports significant discomfort in her hand, characterized by pain that radiates upwards. Initially, she mistook the pain for a myocardial infarction.  She has been informed of her prediabetic status but is not currently on any medication for it. Patient had a hemoglobin A1c of 6.1.  Medications: albuterol MDI, PROVENTIL, VENTOLIN, PROAIR, HFA 90 mcg/actuation inhaler INHALE 2 INHALATIONS INTO THE LUNGS EVERY 6 HOURS AS NEEDED FOR WHEEZING 18 g 3  alendronate (FOSAMAX) 70 MG tablet Take 1 tablet (70 mg total) by mouth every 7 (seven) days Take on an empty stomach with a full glass of water. Avoid mineral or well water. Do not eat or take other medications for at least 30 minutes after dose. Sit or stand for at least 30 minutes after dose. 4 tablet 11  amLODIPine (NORVASC) 10 MG tablet TAKE 1 TABLET(10 MG) BY MOUTH EVERY DAY 30 tablet 11  aspirin 81 MG EC tablet Take 81 mg by mouth once daily.  azelastine (ASTELIN) 137 mcg nasal spray Place 2 sprays into both nostrils 2 (two) times daily 30 mL 5  cetirizine (ZYRTEC) 10 MG tablet Take 1 tablet (10 mg total) by mouth once daily 30 tablet 11  ergocalciferol, vitamin D2, 1,250 mcg (50,000 unit) capsule Take 1 capsule (50,000 Units total) by mouth twice a week for 180 days 24 capsule 1  escitalopram oxalate (LEXAPRO) 20 MG tablet TAKE 1 TABLET(20 MG) BY MOUTH EVERY DAY 30 tablet 11  ferrous sulfate 325 (65 FE) MG  tablet Take 325 mg by mouth daily with breakfast  fluticasone propionate (FLONASE) 50 mcg/actuation nasal spray Place 2 sprays into both nostrils once daily as needed for Rhinitis 16 g 5  lamoTRIgine (LAMICTAL) 100 MG tablet TAKE 1 TABLET(100 MG) BY MOUTH TWICE DAILY 180 tablet 3  lovastatin (MEVACOR) 40 MG tablet TAKE 1 TABLET(40 MG) BY MOUTH DAILY WITH DINNER 90 tablet 3  omeprazole (PRILOSEC) 20 MG DR capsule TAKE 1 CAPSULE(20 MG) BY MOUTH EVERY DAY 30 capsule 11  ondansetron (ZOFRAN) 4 MG tablet Take 2 tablets (8 mg total) by mouth every 8 (eight) hours as needed for Nausea for up to 45 doses 45 tablet 2  pregabalin (LYRICA) 75 MG capsule Take 1 capsule (75 mg total) by mouth 2 (two) times daily 60 capsule 2  tiotropium bromide (SPIRIVA RESPIMAT) 2.5 mcg/actuation inhalation spray Inhale 2 inhalations (5 mcg total) into the lungs once daily 4 g 11  traMADoL (ULTRAM) 50 mg tablet Take 1-2 tablets (50-100 mg total) by mouth every 6 (six) hours as needed for Pain 30 tablet 0  nitroGLYcerin (NITROSTAT) 0.4 MG SL tablet Place 1 tablet (0.4 mg total) under the tongue every 5 (five) minutes as needed for Chest pain May take up to 3 doses. 25 tablet 11   Allergies: Ace inhibitors; Latex, natural rubber; and Pollen extracts  Past Medical History: Abnormal EKG  Arthritis  Breast cancer (CMS/HHS-HCC)  S/P chemo  Centrilobular emphysema (CMS/HHS-HCC)  Coronary  artery disease  Depression  Diabetes (CMS/HHS-HCC)  Heart murmur  Helicobacter pylori (H. 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 [name])  carpal tunnel  release surgery Left 01/27/2023 (Dr. [name])  Dental surgery  MASTECTOMY SIMPLE  S/P eye surgery  VAGINAL HYSTERECTOMY   Social History:  Socioeconomic History:  Marital status: Single  Tobacco Use  Smoking status: Every Day  Current packs/day: 0.25  Average packs/day: 1 pack/day for 39.9 years (39.2 ttl pk-yrs)  Types: Cigarettes  Start date: 09/28/1983  Smokeless tobacco: Current  Types: Snuff  Tobacco comments:  Patient smokes <1/2 ppd. 06/22/2023 -IC  07/27/23 2-4 cigarettes/day BVM  Vaping Use  Vaping status: Never Used  Substance and Sexual Activity  Alcohol use: Not Currently  Comment: occasionally  Drug use: Yes  Types: Marijuana  Sexual activity: Yes  Partners: Male  Birth control/protection: Surgical  Social History Narrative  06/02/2023  Reports health as (Patient-Rptd) (P) Fair  Likes/Enjoys/What fills your day?:  Financial planner: None  Driving Status: Reports does not drive  Home: One Estate agent is on: First Level  Fewest Steps to enter the home: 0  Other persons in the home: no one  Pets: none  Medical equipment you use daily: Cane, Single point  Medical equipment available in the home: Blood pressure monitor  Audiology/Hearing: Patient feels her ears are full.  Dermatology: Denies areas of concern.  Dental: Poor dentition, Dentures, Upper, and Partial, Lower. Does not wear them - since she had chemo  Vision: Wears prescription glasses. Last screening Date: July 2024. Screening is: Up to date. Provider Name: Dr. Damaris Hippo   Social Drivers of Health:   Financial Resource Strain: Low Risk (07/27/2023)  Overall Financial Resource Strain (CARDIA)  Difficulty of Paying Living Expenses: Not hard at all  Recent Concern: Financial Resource Strain - Medium Risk (07/07/2023)  Overall Financial Resource Strain (CARDIA)  Difficulty of Paying Living Expenses: Somewhat hard  Food Insecurity: No Food Insecurity (07/27/2023)  Hunger Vital Sign  Worried  About Running Out of Food in the Last Year: Never true  Ran Out of Food in the Last Year: Never true  Recent Concern: Food Insecurity - Food Insecurity Present (07/07/2023)  Hunger Vital Sign  Worried About Running Out of Food in the Last Year: Sometimes true  Ran Out of Food in the Last Year: Sometimes true  Transportation Needs: No Transportation Needs (07/27/2023)  PRAPARE - Risk analyst (Medical): No  Lack of Transportation (Non-Medical): No  Recent Concern: Transportation Needs - Unmet Transportation Needs (06/01/2023)  PRAPARE - Transportation  Lack of Transportation (Medical): No  Lack of Transportation (Non-Medical): Yes  Physical Activity: Unknown (04/29/2018)  Exercise Vital Sign  Days of Exercise per Week: Patient declined  Minutes of Exercise per Session: Patient declined  Stress: Unknown (04/29/2018)  [employer] of Occupational Health - Occupational Stress Questionnaire  Feeling of Stress : Patient declined  Social Connections: Unknown (04/29/2018)  Social Connection and Isolation Panel [NHANES]  Frequency of Communication with Friends and Family: Patient declined  Frequency of Social Gatherings with Friends and Family: Patient declined  Attends Religious Services: Patient declined  Active Member of  Clubs or Organizations: Patient declined  Attends Banker Meetings: Patient declined  Marital Status: Patient declined  Housing Stability: Low Risk (07/27/2023)  Housing Stability Vital Sign  Unable to Pay for Housing in the Last Year: No  Number of Times Moved in the Last Year: 0  Homeless in the Last Year: No  Recent Concern: Housing Stability - High Risk (07/05/2023)  Housing Stability Vital Sign  Unable to Pay for Housing in the Last Year: Yes  Number of Times Moved in the Last Year: 0  Homeless in the Last Year: No   Family History: Asthma Mother Naoma Bozzi  High blood pressure (Hypertension) Mother Bralee Leyh  High blood  pressure (Hypertension) Father Azalee Dumire   Review of Systems: A comprehensive 14 point ROS was performed, reviewed, and the pertinent orthopaedic findings are documented in the HPI.  Physical Exam: BP 138/70  Ht 154.9 cm (5\' 1" )  Wt 47.2 kg (104 lb)  BMI 19.65 kg/m   General/Constitutional: NAD, conversant Eyes: Pupils equal and round, extraocular movements intact ENT: atraumatic external nose and ears, moist mucous membranes Respiratory: non-labored breathing, symmetric chest rise, chest sounds clear. Cardiovascular: no visible lower extremity edema, peripheral pulses present, regular rate and rhythm  Skin: normal skin turgor, warm and dry Neurological: cranial nerves grossly intact, sensation grossly intact Psychological: Appropriate mood and affect; appropriate judgment Musculoskeletal: as detailed below:  General:  General/Constitutional: No apparent distress: well-nourished and well developed. Eyes: Pupils equal, round with synchronous movement. Lungs: Clear to auscultation HEENT: Normal Vascular: No edema, swelling or tenderness, except as noted in detailed exam. Cardiac: Heart rate and rhythm is regular. Integumentary: No impressive skin lesions present, except as noted in detailed exam. Neuro/Psych: Normal mood and affect, oriented to person, place, and time.  Heart: Examination of the heart reveals regular, rate, and rhythm. There is no murmur noted on ascultation. There is a normal apical pulse.  Lungs: Lungs are clear to auscultation. There is no wheeze, rhonchi, or crackles. There is normal expansion of bilateral chest walls.   Musculoskeletal Examination: Patient has full range of motion of the wrist and hands with no discomfort. The previous scar is noted. She was able to make a fist, straighten her fingers, and bring her thumb to her little finger. Tingling sensation was noted about 2 cm proximal to the wrist crease. Positive Tinel's sign was observed. Vascular  intact.  EMG, nerve conduction study results: The EMG and nerve conduction study results revealed residual nerve compression of the little finger, thumb, and index finger  Impression: 1. Carpal tunnel syndrome of left wrist.  2. S/P right carpal tunnel release.  3. Type 2 diabetes mellitus without complication.   Plan:  1. I discussed the physical exam finding as well as the upcoming surgical procedure. The patient is scheduled for a left wrist open carpal tunnel release to be done by Dr. Joice Lofts on August 26, 2023. 2. We discussed the left wrist carpal tunnel release to be done with an open procedure. We discussed her previous incision from her previous carpal tunnel release. We reviewed the risks, benefits, and alternatives to the surgery. The patient seems understand and is ready to set up for August 26, 2023. 3. The patient will wear a wrist splint and protection for the 2 weeks after surgery. 4. The patient will follow-up after surgery for postoperative care.  This note will serve as the history and physical for surgery scheduled on August 26, 2023 for a left wrist open  carpal tunnel release to be done by Dr. Joice Lofts.    H&P reviewed and patient re-examined. No changes.

## 2023-08-26 NOTE — Discharge Instructions (Addendum)
 Orthopedic discharge instructions: Keep dressing dry and intact. Keep hand elevated above heart level. May shower after dressing removed on postop day 4 (Sunday). Cover sutures with Band-Aids after drying off. Apply ice to affected area frequently. Take naproxen 500 mg BID OR ibuprofen 600-800 mg TID with meals for 3-5 days, then as necessary. Take ES Tylenol or pain medication as prescribed when needed.  May resume daily baby aspirin tomorrow morning. Return for follow-up in 10-14 days or as scheduled.

## 2023-08-26 NOTE — Anesthesia Procedure Notes (Signed)
 Procedure Name: LMA Insertion Date/Time: 08/26/2023 11:30 AM  Performed by: Morene Crocker, CRNAPre-anesthesia Checklist: Patient identified, Patient being monitored, Timeout performed, Emergency Drugs available and Suction available Patient Re-evaluated:Patient Re-evaluated prior to induction Oxygen Delivery Method: Circle system utilized Preoxygenation: Pre-oxygenation with 100% oxygen Induction Type: IV induction Ventilation: Mask ventilation without difficulty LMA: LMA inserted LMA Size: 3.0 Tube type: Oral Number of attempts: 1 Placement Confirmation: positive ETCO2 and breath sounds checked- equal and bilateral Tube secured with: Tape Dental Injury: Teeth and Oropharynx as per pre-operative assessment  Comments: Smooth atraumatic LMA placement, no complications noted.

## 2023-08-27 ENCOUNTER — Encounter: Payer: Self-pay | Admitting: Surgery

## 2023-08-31 NOTE — Anesthesia Preprocedure Evaluation (Signed)
 Anesthesia Evaluation  Patient identified by MRN, date of birth, ID band Patient awake    Reviewed: Allergy & Precautions, H&P , NPO status , Patient's Chart, lab work & pertinent test results, reviewed documented beta blocker date and time   Airway Mallampati: II  TM Distance: >3 FB Neck ROM: full    Dental  (+) Teeth Intact   Pulmonary COPD, Current Smoker and Patient abstained from smoking.   Pulmonary exam normal        Cardiovascular Exercise Tolerance: Good hypertension, + angina with exertion + CAD and + Past MI  (-) Orthopnea, (-) PND and (-) DOE Normal cardiovascular exam+ Valvular Problems/Murmurs  Rate:Normal     Neuro/Psych  PSYCHIATRIC DISORDERS  Depression     Neuromuscular disease    GI/Hepatic negative GI ROS, Neg liver ROS,,,  Endo/Other  negative endocrine ROSdiabetes    Renal/GU negative Renal ROS  negative genitourinary   Musculoskeletal   Abdominal   Peds  Hematology  (+) Blood dyscrasia, anemia   Anesthesia Other Findings   Reproductive/Obstetrics negative OB ROS                             Anesthesia Physical Anesthesia Plan  ASA: 4  Anesthesia Plan: General LMA   Post-op Pain Management:    Induction:   PONV Risk Score and Plan:   Airway Management Planned:   Additional Equipment:   Intra-op Plan:   Post-operative Plan:   Informed Consent: I have reviewed the patients History and Physical, chart, labs and discussed the procedure including the risks, benefits and alternatives for the proposed anesthesia with the patient or authorized representative who has indicated his/her understanding and acceptance.       Plan Discussed with: CRNA  Anesthesia Plan Comments:        Anesthesia Quick Evaluation

## 2023-08-31 NOTE — Anesthesia Postprocedure Evaluation (Signed)
 Anesthesia Post Note  Patient: Tonya Keith  Procedure(s) Performed: CARPAL TUNNEL RELEASE (Left: Wrist)  Patient location during evaluation: PACU Anesthesia Type: General Level of consciousness: awake and alert Pain management: pain level controlled Vital Signs Assessment: post-procedure vital signs reviewed and stable Respiratory status: spontaneous breathing, nonlabored ventilation, respiratory function stable and patient connected to nasal cannula oxygen Cardiovascular status: blood pressure returned to baseline and stable Postop Assessment: no apparent nausea or vomiting Anesthetic complications: no   No notable events documented.   Last Vitals:  Vitals:   08/26/23 1330 08/26/23 1405  BP: 116/71 129/64  Pulse: 84 90  Resp: 17 15  Temp: 36.7 C 36.7 C  SpO2: 99% 100%    Last Pain:  Vitals:   08/26/23 1405  TempSrc: Temporal  PainSc: 0-No pain                 Yevette Edwards

## 2023-10-05 ENCOUNTER — Ambulatory Visit
Admission: RE | Admit: 2023-10-05 | Discharge: 2023-10-05 | Disposition: A | Discharge status: Home / Self Care | Payer: 59 | Source: Ambulatory Visit | Attending: Obstetrics and Gynecology | Admitting: Obstetrics and Gynecology

## 2023-10-05 DIAGNOSIS — Z1231 Encounter for screening mammogram for malignant neoplasm of breast: Secondary | ICD-10-CM | POA: Diagnosis present

## 2023-11-13 ENCOUNTER — Encounter: Payer: Self-pay | Admitting: *Deleted

## 2023-11-30 ENCOUNTER — Encounter: Payer: Self-pay | Admitting: Gastroenterology

## 2023-11-30 ENCOUNTER — Ambulatory Visit
Admission: RE | Admit: 2023-11-30 | Discharge: 2023-11-30 | Disposition: A | Discharge status: Home / Self Care | Attending: Gastroenterology | Admitting: Gastroenterology

## 2023-11-30 ENCOUNTER — Encounter
Admission: RE | Disposition: A | Discharge status: Home / Self Care | Payer: Self-pay | Source: Home / Self Care | Attending: Gastroenterology

## 2023-11-30 DIAGNOSIS — K219 Gastro-esophageal reflux disease without esophagitis: Secondary | ICD-10-CM | POA: Diagnosis present

## 2023-11-30 DIAGNOSIS — Z1211 Encounter for screening for malignant neoplasm of colon: Secondary | ICD-10-CM | POA: Insufficient documentation

## 2023-11-30 DIAGNOSIS — Z539 Procedure and treatment not carried out, unspecified reason: Secondary | ICD-10-CM | POA: Diagnosis not present

## 2023-11-30 HISTORY — PX: COLONOSCOPY: SHX5424

## 2023-11-30 HISTORY — PX: ESOPHAGOGASTRODUODENOSCOPY: SHX5428

## 2023-11-30 SURGERY — COLONOSCOPY
Anesthesia: General

## 2023-11-30 MED ORDER — SODIUM CHLORIDE 0.9 % IV SOLN: INTRAVENOUS | Status: DC

## 2023-12-01 NOTE — Progress Notes (Signed)
 Follow-up   History of Present Illness: Tonya Keith is a 63 y.o. female who  presents to clinic for follow up visit.  Copd stable, still smoking advised not too. Weight is down. For colonoscopy. No black stools or change in BM'S. No localizing pain, headaches. Her appetite is down. No fever or chills. S/p left breast cancer.  WBC 4.0 - 10.5 K/uL 9.8  RBC 3.87 - 5.11 MIL/uL 5.09  Hemoglobin 12.0 - 15.0 g/dL 86.3  HCT 63.9 - 53.9 % 38.7  MCV 80.0 - 100.0 fL 76 Low   MCH 26.0 - 34.0 pg 26.7  MCHC 30.0 - 36.0 g/dL 64.8  RDW 88.4 - 84.4 % 14  Platelets 150 - 400 K/uL 221   Sodium 135 - 145 mmol/L 139  Potassium 3.5 - 5.1 mmol/L 3.7  Chloride 98 - 111 mmol/L 104  CO2 22 - 32 mmol/L 27  Glucose, Bld 70 - 99 mg/dL 877 High   Comment: Glucose reference range applies only to samples taken after fasting for at least 8 hours.  BUN 8 - 23 mg/dL 10  Creatinine, Ser 9.55 - 1.00 mg/dL 9.18  Calcium 8.9 - 89.6 mg/dL 9.2  GFR, Estimated >39 mL/min >60   HIV 1&2 Antibody/Antigen NonReactive   Resulting Agency MICRO <redacted file path>    Problem List:  Patient Active Problem List  Diagnosis  . Myocardial infarction (CMS/HHS-HCC)  . Hypertension  . Hypercholesterolemia  . Heart murmur  . Coronary artery disease involving native coronary artery of native heart without angina pectoris  . Major depressive disorder, recurrent, moderate (CMS/HHS-HCC)  . Vitamin D  insufficiency  . Malignant neoplasm of left female breast (CMS/HHS-HCC)  . Osteopenia  . Chronic obstructive pulmonary disease (CMS/HHS-HCC)  . Hemoglobin C trait ()  . Moderate tobacco use disorder  . Other insomnia  . Type 2 diabetes mellitus  . Memory impairment  . Abnormal drug screen  . Chronic hip pain, bilateral  . Chronic bilateral low back pain without sciatica  . Generalized osteoarthritis of multiple sites  . History of marijuana use  . Lumbar radiculitis  . Primary osteoarthritis of both hips  .  Osteoporosis  . Carpal tunnel syndrome, left    Current Medications:  Current Outpatient Medications  Medication Sig Dispense Refill  . albuterol  MDI, PROVENTIL , VENTOLIN , PROAIR , HFA 90 mcg/actuation inhaler INHALE 2 INHALATIONS INTO THE LUNGS EVERY 6 HOURS AS NEEDED FOR WHEEZING 18 g 3  . alendronate (FOSAMAX) 70 MG tablet Take 1 tablet (70 mg total) by mouth every 7 (seven) days Take on an empty stomach with a full glass of water. Avoid mineral or well water. Do not eat or take other medications for at least 30 minutes after dose. Sit or stand for at least 30 minutes after dose. 4 tablet 11  . amLODIPine (NORVASC) 10 MG tablet Take 1 tablet (10 mg total) by mouth once daily 30 tablet 8  . amoxicillin -clavulanate (AUGMENTIN ) 500-125 mg tablet Take 1 tablet by mouth 2 (two) times daily    . aspirin  81 MG EC tablet Take 81 mg by mouth once daily.    SABRA azelastine (ASTELIN) 137 mcg nasal spray Place 2 sprays into both nostrils 2 (two) times daily 30 mL 5  . cetirizine (ZYRTEC) 10 MG tablet Take 1 tablet (10 mg total) by mouth once daily 30 tablet 11  . ergocalciferol , vitamin D2, 1,250 mcg (50,000 unit) capsule Take 1 capsule (50,000 Units total) by mouth twice a week for 180 days 24 capsule  1  . escitalopram oxalate (LEXAPRO) 20 MG tablet Take 1 tablet (20 mg total) by mouth once daily 30 tablet 8  . ferrous sulfate 325 (65 FE) MG tablet Take 325 mg by mouth daily with breakfast    . fluticasone propionate (FLONASE) 50 mcg/actuation nasal spray Place 2 sprays into both nostrils once daily as needed for Rhinitis 16 g 5  . gabapentin (NEURONTIN) 300 MG capsule Take 300 mg by mouth    . HYDROcodone -acetaminophen  (NORCO) 5-325 mg tablet Take 1-2 tablets by mouth every 6 (six) hours as needed for Pain 30 tablet 0  . ipratropium (ATROVENT) 21 mcg (0.03 %) nasal spray Place 2 sprays into one nostril once daily as needed    . lamoTRIgine (LAMICTAL) 100 MG tablet TAKE 1 TABLET(100 MG) BY MOUTH TWICE DAILY  180 tablet 3  . lovastatin (MEVACOR) 40 MG tablet TAKE 1 TABLET(40 MG) BY MOUTH DAILY WITH DINNER 90 tablet 2  . naproxen (NAPROSYN) 500 MG tablet Take 500 mg by mouth    . omeprazole (PRILOSEC) 20 MG DR capsule TAKE 1 CAPSULE(20 MG) BY MOUTH EVERY DAY 30 capsule 11  . ondansetron  (ZOFRAN ) 4 MG tablet Take 2 tablets (8 mg total) by mouth every 8 (eight) hours as needed for Nausea for up to 45 doses 45 tablet 2  . pregabalin (LYRICA) 75 MG capsule Take 1 capsule (75 mg total) by mouth 2 (two) times daily 60 capsule 2  . sod picosulf-mag ox-citric ac (CLENPIQ) 10 mg-3.5 gram- 12 gram/175 mL Soln Take 2 Bottles by mouth as directed 350 mL 0  . sodium, potassium, and magnesium (SUPREP) oral solution Take 1 Bottle by mouth as directed One kit contains 2 bottles.  Take both bottles at the times instructed by your provider. 354 mL 0  . tiotropium bromide (SPIRIVA RESPIMAT) 2.5 mcg/actuation inhalation spray Inhale 2 inhalations (5 mcg total) into the lungs once daily 4 g 11  . nitroGLYcerin (NITROSTAT) 0.4 MG SL tablet Place 1 tablet (0.4 mg total) under the tongue every 5 (five) minutes as needed for Chest pain May take up to 3 doses. 25 tablet 11   No current facility-administered medications for this visit.    History: Past Medical History:  Diagnosis Date  . Abnormal EKG   . Arthritis   . Breast cancer (CMS/HHS-HCC)    S/P chemo  . Centrilobular emphysema (CMS/HHS-HCC)   . Coronary artery disease   . Depression   . Diabetes (CMS/HHS-HCC)   . Heart murmur   . Helicobacter pylori (H. pylori)   . Hemoglobin C trait () 03/22/2019  . History of angina    stable  . Hypercholesterolemia   . Hypertension   . Memory impairment 08/15/2022  . Migraines   . Myocardial infarction (CMS/HHS-HCC)   . Osteopenia 03/06/2019  . Smoking   . Trichomonosis 05/17/2020    Past Surgical History:  Procedure Laterality Date  . PCI and stent RCA  05/26/1997   bare metal stent  . Release of bilateral  trigger thumbs  01/07/2008  . carpal tunnel release surgery Right 09/10/2022   Dr. Ozell Flake  . carpal tunnel release surgery Left 01/27/2023   Dr. Ozell Flake  . Dental surgery    . MASTECTOMY SIMPLE    . S/P eye surgery    . VAGINAL HYSTERECTOMY      Family History  Problem Relation Name Age of Onset  . Asthma Mother Darline Nelligan   . High blood pressure (Hypertension) Mother Anberlin Gamboa   .  High blood pressure (Hypertension) Father Katera Hert     SHX:  reports that she has been smoking cigarettes. She started smoking about 40 years ago. She has a 39.3 pack-year smoking history. Her smokeless tobacco use includes snuff. She reports that she does not currently use alcohol. She reports current drug use. Drug: Marijuana.   Review of Systems: As per above. All systems were reviewed with her in totality and there were no further positive findings. Presently no bleeding or bruising. No dysuria, hematuria, fever, chills, nor sweats. No lymph nodes, no rashes, TIAs, seizures, passing out spells, or suicidal ideation.   Physical Exam: BP 107/67   Pulse 80   Wt (!) 44.8 kg (98 lb 12.8 oz)   SpO2 97% Comment: room air  BMI 18.67 kg/m  (!) 44.8 kg (98 lb 12.8 oz) 97% General:  NAD. Able to speak in complete sentences without cough or dyspnea HEENT: Normocephalic, nontraumatic. Extraocular movements intact NECK: Supple. No JVD, nodes, thryomegaly CV: RRR no murmurs, gallops, rubs PULM: Normal respiratory effort, Clear to auscultation bilaterally without wheezing or crackles EXTREMITIESs: No significant edema, cyanosis or Homans'signs SKIN: Fair turgor. No rashes LYMPHATIC: No nodes NEURO: No gross deficits PSYCH: Appropriate affect, alert, oriented   FINDINGS:  Cardiovascular: Atherosclerotic calcification of the aorta with age  advanced involvement of the coronary arteries. Heart is at the upper  limits of normal in size to mildly enlarged. No pericardial  effusion.    Mediastinum/Nodes: No pathologically enlarged mediastinal or  axillary lymph nodes. Hilar regions are difficult to definitively  evaluate without IV contrast. Left mastectomy. Esophagus is grossly  unremarkable.   Lungs/Pleura: Centrilobular and paraseptal emphysema. Scarring along  the minor fissure. Calcified granulomas. 3.9 mm right lower lobe  nodule. No suspicious pulmonary nodules. No pleural fluid. Airway is  unremarkable.   Upper Abdomen: Visualized portions of the liver, gallbladder,  adrenal glands, kidneys, spleen, pancreas, stomach and bowel are  grossly unremarkable. No upper abdominal adenopathy.   Musculoskeletal: Minimal degenerative change in the spine.   IMPRESSION:  1. Lung-RADS 2, benign appearance or behavior. Continue annual  screening with low-dose chest CT without contrast in 12 months.  2. Age advanced three-vessel coronary artery calcification.  3.  Aortic atherosclerosis (ICD10-I70.0).  4.  Emphysema (ICD10-J43.9).    Electronically Signed    By: Newell Eke M.D.    On: 07/13/2023 08:46      IMPRESSION:  FINDINGS:  Cardiovascular: Three-vessel coronary artery atherosclerosis with  lesser involvement of the thoracic aorta. No acute vascular findings  on noncontrast imaging. The heart size is normal. There is no  pericardial effusion.   Mediastinum/Nodes: Postsurgical changes in the left axilla. There  are no enlarged mediastinal, hilar, axillary or internal mammary  lymph nodes. Hilar assessment is limited by the lack of intravenous  contrast, although the hilar contours appear unchanged. The thyroid   gland, trachea and esophagus demonstrate no significant findings.   Lungs/Pleura: No pleural effusion or pneumothorax. Moderate  centrilobular and paraseptal emphysema with stable mild biapical  scarring. No suspicious pulmonary nodules.   Upper abdomen: The visualized upper abdomen appears stable, without  significant findings.    Musculoskeletal/Chest wall: There is no chest wall mass or  suspicious osseous finding. Status post left mastectomy and axillary  node dissection.   1. Stable chest CT without evidence of metastatic disease. No  suspicious pulmonary nodules.  2. No acute chest findings or explanation for the patient's  symptoms.  3. Three-vessel coronary artery atherosclerosis.  4. Aortic Atherosclerosis (ICD10-I70.0) and Emphysema (ICD10-J43.9).    Electronically Signed    By: Elsie Perone M.D.    On: 05/07/2022 11:25      Pulmonary: No PR                      No PS                        81.4 cm/sec peak vel       2.7 mmHg peak grad _________________________________________________________________________________________ INTERPRETATION NORMAL LEFT VENTRICULAR SYSTOLIC FUNCTION NORMAL RIGHT VENTRICULAR SYSTOLIC FUNCTION NO VALVULAR STENOSIS TRIVIAL MR, TR EF 50%   Today's cxr is clear. + hyperinflation.   Spirometry fvc 2.33 liters ( 100 %), fev1 1.69 liters ( 96 %), exp flow volume loop is normal normal spiro vs early obstruction.   Impression: Early copd. Still smoking, advised again to stop smoking. Went over her chest ct with her.  DYSPNEA IS NO WORSE.  rhinitis. No gerd. Continue spiriva one puff q day, albuterol  prn Flonase, zyrtec 10 mg q day Low dose chest ct scan 12/25 Stop smoking F/u in 4.5 months Further orders per above   Diagnoses and all orders for this visit:  Depression screening (Z13.31) -     Depression Screen -(PHQ- 2/9, BDI)   Weight loss, non intentional increase po intake Stop smoking Check tsh, free t4, cea Colonoscopy to be done Log weights F/u with pcp as scheduled

## 2023-12-20 NOTE — Therapy (Unsigned)
 OUTPATIENT OCCUPATIONAL THERAPY ORTHO EVALUATION  Patient Name: Tonya Keith MRN: [id number] DOB:Aug 15, 1960, 63 y.o., female Today's Date: 12/21/2023  PCP: Dr Idelia REFERRING PROVIDER: Dr Edie  END OF SESSION:  OT End of Session - 12/21/23 1203     Visit Number 1    Number of Visits 12    Date for OT Re-Evaluation 02/15/24    OT Start Time 1203    OT Stop Time 1244    OT Time Calculation (min) 41 min    Activity Tolerance Patient tolerated treatment well    Behavior During Therapy [facility] for tasks assessed/performed          Past Medical History:  Diagnosis Date   Anemia    Anginal pain (HCC)    Aortic atherosclerosis (HCC)    Arthritis    Bilateral carpal tunnel syndrome    Bradycardia    Breast cancer, left (HCC) 2008   a.) s/p surgical resection (mastectomy) + systemic chemotherapy   Cardiac murmur    Cerebral tonsillar ectopia    COPD (chronic obstructive pulmonary disease) (HCC)    Coronary artery disease    a.) PCI 08/09/2007: 100% mRCA (2.25 x 23 mm Mini Vision BMS)   Depression    H. pylori infection    Hemoglobin C trait (HCC)    HLD (hyperlipidemia)    Hx of migraine headaches    Hypertension    Long-term use of aspirin  therapy    Marijuana use    Memory impairment    OSA (obstructive sleep apnea)    a.) does not require nocturnal PAP therapy   Osteopenia    a.) on oral bisphosphonate (alendronate)   Palpitations    Personal history of chemotherapy    Postural hypotension    Pulmonary granuloma (HCC)    Smoker    STEMI involving right coronary artery (HCC) 08/09/2007   a.) LHC/PCI 08/09/2007: 100% mRCA (2.5 x 23 mm Mini Vision BMS)   T2DM (type 2 diabetes mellitus) (HCC)    Trichomoniasis    Vitamin D  insufficiency    Wears dentures    full upper (currently unable to wear)   Past Surgical History:  Procedure Laterality Date   ABDOMINAL HYSTERECTOMY     ANKLE FRACTURE SURGERY Right    CARPAL TUNNEL RELEASE Left 01/27/2023   Procedure:  CARPAL TUNNEL RELEASE;  Surgeon: Kathlynn Sharper, MD;  Location: [facility];  Service: Orthopedics;  Laterality: Left;   CARPAL TUNNEL RELEASE Left 08/26/2023   Procedure: CARPAL TUNNEL RELEASE;  Surgeon: Edie Norleen PARAS, MD;  Location: [facility] ORS;  Service: Orthopedics;  Laterality: Left;   COLONOSCOPY N/A 11/30/2023   Procedure: COLONOSCOPY;  Surgeon: Maryruth Ole DASEN, MD;  Location: [facility];  Service: Endoscopy;  Laterality: N/A;   CORONARY ANGIOPLASTY WITH STENT PLACEMENT Left 08/09/2007   Procedure: CORONARY ANGIOPLASTY WITH STENT PLACEMENT; Location: [facility]; Surgeon: Margie Lovelace, MD   ESOPHAGOGASTRODUODENOSCOPY N/A 11/30/2023   Procedure: EGD (ESOPHAGOGASTRODUODENOSCOPY);  Surgeon: Maryruth Ole DASEN, MD;  Location: [facility] ENDOSCOPY;  Service: Endoscopy;  Laterality: N/A;   LEFT HEART CATH AND CORONARY ANGIOGRAPHY Left 09/07/2019   Procedure: LEFT HEART CATH AND CORONARY ANGIOGRAPHY;  Surgeon: Lovelace Cara BIRCH, MD;  Location: [facility] INVASIVE CV LAB;  Service: Cardiovascular;  Laterality: Left;   LEFT HEART CATH AND CORONARY ANGIOGRAPHY Left 06/12/2010   Procedure: LEFT HEART CATH AND CORONARY ANGIOGRAPHY; Location: [facility]; Surgeon: Margie Lovelace, MD   LESION EXCISION WITH COMPLEX REPAIR Right 12/04/2020   Procedure: Excision right upper lip intraoral  lesion;  Surgeon: Blair Mt, MD;  Location: [facility];  Service: ENT;  Laterality: Right;  Latex   MASTECTOMY Left 2008   MINOR EXCISION OF ORAL LESION N/A 10/02/2020   Procedure: excision of right upper lip mass, intraoral;  Surgeon: Blair Mt, MD;  Location: [facility];  Service: ENT;  Laterality: N/A;  Latex   WISDOM TOOTH EXTRACTION     Patient Active Problem List   Diagnosis Date Noted   Osteoarthritis of hips (Bilateral) 11/03/2022   Lumbar radiculitis (L5, S1) (Bilateral) (L>R) 11/03/2022   Lumbosacral radiculopathy/radiculitis at S1 (Bilateral) (L>R) 11/03/2022   Lumbosacral  radiculopathy/radiculitis at L5 (Bilateral) (L>R) 11/03/2022   Abnormal drug screen (09/22/2022) 09/25/2022   Chronic hip pain (Bilateral) (R>L) 09/22/2022   Decreased range of motion of hips (Bilateral) (R>L) 09/22/2022   Chronic low back pain (2ry area of Pain) (Bilateral) (R>L) w/o sciatica 09/22/2022   Decreased range of motion of lumbar spine 09/22/2022   Generalized osteoarthritis of multiple sites 09/22/2022   History of marijuana use 09/22/2022   Heart murmur 09/21/2022   Myocardial infarction (HCC) 09/21/2022   Chronic pain syndrome 09/21/2022   Pharmacologic therapy 09/21/2022   Disorder of skeletal system 09/21/2022   Problems influencing health status 09/21/2022   Memory impairment 08/15/2022   Type 2 diabetes mellitus (HCC) 02/04/2022   Other insomnia 08/28/2019   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   Chronic obstructive pulmonary disease (HCC) 03/06/2019   Coronary artery disease involving native coronary artery of native heart without angina pectoris 03/06/2019   Major depressive disorder, recurrent, moderate (HCC) 03/06/2019   Malignant neoplasm of left female breast (HCC) 03/06/2019   Osteopenia 03/06/2019   Vitamin D  insufficiency 03/06/2019   Hypothermia 12/03/2017   Chronic pain of lower extremity (1ry area of Pain) (Bilateral) 11/17/2017   Bilateral lower extremity edema 11/17/2017   Moderate tobacco use disorder 11/17/2017   Hypertension 11/17/2017   Hypercholesterolemia 11/17/2017    ONSET DATE: 08/26/23  REFERRING DIAG: L  CTR revision  THERAPY DIAG:  Carpal tunnel syndrome, right upper limb  Pain in left hand  Pain in left wrist  Scar condition and fibrosis of skin  Muscle weakness (generalized)  Stiffness of left hand, not elsewhere classified  Rationale for Evaluation and Treatment: Rehabilitation  SUBJECTIVE:   SUBJECTIVE STATEMENT: I tried to do some of the exercises on my own.  With my wrist really hurts and the side of my hand on my  pinky.  Like I cannot pick up grip things.  When I try make a fist my finger hurts in my wrist.  And then my index and middle and ring finger still numb. Pt accompanied by: self  PERTINENT HISTORY: Dr Edie appt 01/30/24 - refer to OT again - Navneet Schmuck is a 63 y.o. female who presents for follow-up now 3 months status post a revision open left carpal tunnel release. The patient continues to note moderate to severe pain in her left wrist and hand with occasional radiation into the volar forearm region. She rates his pain as high as 8/10, and has been taking Tylenol  and soaking her hand in hot water with limited benefit. She did not attend formal occupational therapy as recommended at her last visit, stating that she called but they did not have any openings at the time. She has been trying to do some exercises on her own at home with limited benefit. She denies any reinjury to the wrist, and denies any fevers or chills.  Regional left carpal tunnel release for September 24  PRECAUTIONS: None     WEIGHT BEARING RESTRICTIONS: No  PAIN:  Are you having pain?  8/10 pain at the volar wrist, ulnar side of the hand and fifth digit  FALLS: Has patient fallen in last 6 months? No  LIVING ENVIRONMENT: Lives with: lives alone   PLOF: Independent in bathing and dressing as well as like cooking and laundry.  Some it takes her to get groceries.  Lives in the apartment.  1 step to get into  PATIENT GOALS: I want to get the pain better, my motion and strength so I can use it like my right hand -thank you help miliaris your  NEXT MD VISIT: ?  OBJECTIVE:  Note: Objective measures were completed at Evaluation unless otherwise noted.  HAND DOMINANCE: Right  ADLs: Unable to grip, squeeze, pick up, carry, push or pull with left hand  FUNCTIONAL OUTCOME MEASURES: Next session  UPPER EXTREMITY ROM:     Active ROM Right eval Left eval  Shoulder flexion    Shoulder abduction    Shoulder adduction     Shoulder extension    Shoulder internal rotation    Shoulder external rotation    Elbow flexion    Elbow extension    Wrist flexion  68  Wrist extension  65  Wrist ulnar deviation  30  Wrist radial deviation  25  Wrist pronation  90  Wrist supination  90   (Blank rows = not tested) Pain with all wrist AROM - 7-8/10 pain ulnar wrist and hand into the forearm and hand   Active ROM Right eval Left eval  Thumb MCP (0-60)    Thumb IP (0-80)    Thumb Radial abd/add (0-55)     Thumb Palmar abd/add (0-45)     Thumb Opposition to Small Finger     Index MCP (0-90)  80   Index PIP (0-100)  100   Index DIP (0-70)      Long MCP (0-90)   90   Long PIP (0-100)    100  Long DIP (0-70)      Ring MCP (0-90)    90  Ring PIP (0-100)   100   Ring DIP (0-70)      Little MCP (0-90)   90   Little PIP (0-100)   100   Little DIP (0-70)      (Blank rows = not tested)   HAND FUNCTION: Grip strength: Right: 56 lbs; Left: 19 lbs, Lateral pinch: Right: 14 lbs, Left: 8 lbs, and 3 point pinch: Right: 11 lbs, Left: 7 lbs Pain 7-8/10 on left hand with all grip and prehension  COORDINATION: Decreased because of increased pain and range of motion  SENSATION: Patient report numbness in 2nd, 3rd and 4th digits will assess Semmes-Weinstein and neck session  EDEMA: Over the ulnar wrist  COGNITION: Overall cognitive status: Within functional limits for tasks assessed     TREATMENT DATE: 12/21/23  Modalities: Fluidotherapy:  Time: 8 Location: Left hand and wrist 10 to rotations of ice for 1 minute for contrast to decrease pain and edema and increase motion. Patient to do contrast at home 3 times a day prior to soft tissue mobilization and range of motion Pain decreased to 2/10.   Patient fitted with a Benik neoprene wrap for compression to decrease pain and edema at the  ulnar wrist Patient was educated on scar massage to volar scar at the wrist as well as fitted with a Cica -Care scar pad for nighttime.  With a Tubigrip D for light compression.  Tendon glides pain-free focusing on motion more than forcing 12 reps Patient tomorrow to the right hand as well as watch the right hand while performing tendon glides. Patient guarding less and show increased motion with decreased pain Opposition to all digits 8 reps  Responded great in session with decreased pain and increased motion.       PATIENT EDUCATION: Education details: findings of eval and HEP  Person educated: Patient Education method: Explanation, Demonstration, Tactile cues, Verbal cues, and Handouts Education comprehension: verbalized understanding, returned demonstration, verbal cues required, and needs further education     GOALS: Goals reviewed with patient? Yes  LONG TERM GOALS: Target date: 8 wks   Pain in left hand decreased to less than 2/10 with composite fisting as well as using left hand in bathing dressing Baseline: Pain 7-8/10 at volar wrist ulnar hand and fifth digit.  With any attempts of flexion. Goal status: INITIAL  2.  Left wrist and forearm AROM improve within normal limits symptom-free to use in bathing and dressing Baseline: AROM at the wrist left 65 extension 68 flexion forearm or radial ulnar deviation within normal limits but pain 7-8/10 at wrist ulnarly and hand and fifth digit Goal status: INITIAL  3.  Left grip and prehension strength improved within normal range age for patient symptom-free to be able to squeeze a washcloth pick up a drink pull up pants perform hygiene Baseline:  Grip strength: Right: 56 lbs; Left: 19 lbs, Lateral pinch: Right: 14 lbs, Left: 8 lbs, and 3 point pinch: Right: 11 lbs, Left: 7 lbs Goal status: INITIAL  4.  Left hand and wrist improved for patient to be able to push and pull door, turn a doorknob cut food open container  symptom-free Baseline: Pain 7-8/10 with any attempts of making composite fist as well as wrist flexion extension decreased pain at volar and ulnar wrist as well as ulnar hand and fifth digit Goal status: INITIAL   ASSESSMENT:  CLINICAL IMPRESSION: Patient seen today for occupational therapy evaluation for left carpal tunnel revision on 08/26/2023 by Dr.Poggi -patient present at OT evaluation with pain 7-8/10 at the ulnar and volar wrist as well as ulnar hand and fifth digit.  Patient very protective and guarding of left hand use and attempts of range of motion.  Patient limited in wrist flexion extension as well as grip and prehension strength.  Patient limited in functional use of left hand in ADLs and IADLs.  Patient can benefit from skilled OT services to decrease scar tissue, decrease edema and pain and increase motion and strength to return to prior level of function.  PERFORMANCE DEFICITS: in functional skills including ADLs, IADLs, ROM, strength, pain, flexibility, decreased knowledge of use of DME, and UE functional use,   and psychosocial skills including environmental adaptation and routines and behaviors.   IMPAIRMENTS: are limiting patient from ADLs, IADLs, rest and sleep,  play, leisure, and social participation.   COMORBIDITIES: has no other co-morbidities that affects occupational performance. Patient will benefit from skilled OT to address above impairments and improve overall function.  MODIFICATION OR ASSISTANCE TO COMPLETE EVALUATION: No modification of tasks or assist necessary to complete an evaluation.  OT OCCUPATIONAL PROFILE AND HISTORY: Problem focused assessment: Including review of records relating to presenting problem.  CLINICAL DECISION MAKING: LOW - limited treatment options, no task modification necessary  REHAB POTENTIAL: Good for goals  EVALUATION COMPLEXITY: Low      PLAN:  OT FREQUENCY: 1-2x/week  OT DURATION: 8 weeks  PLANNED INTERVENTIONS: 97168  OT Re-evaluation, 97535 self care/ADL training, 02889 therapeutic exercise, 97530 therapeutic activity, 97140 manual therapy, 97035 ultrasound, 97018 paraffin, 02960 fluidotherapy, 97034 contrast bath, scar mobilization, passive range of motion, patient/family education, and DME and/or AE instructions    CONSULTED AND AGREED WITH PLAN OF CARE: Patient     Ancel Peters, OTR/L,CLT 12/21/2023, 1:47 PM

## 2023-12-21 ENCOUNTER — Ambulatory Visit: Attending: Surgery | Admitting: Occupational Therapy

## 2023-12-21 ENCOUNTER — Encounter: Payer: Self-pay | Admitting: Occupational Therapy

## 2023-12-21 DIAGNOSIS — L905 Scar conditions and fibrosis of skin: Secondary | ICD-10-CM | POA: Insufficient documentation

## 2023-12-21 DIAGNOSIS — M79642 Pain in left hand: Secondary | ICD-10-CM | POA: Insufficient documentation

## 2023-12-21 DIAGNOSIS — M25532 Pain in left wrist: Secondary | ICD-10-CM | POA: Diagnosis present

## 2023-12-21 DIAGNOSIS — M6281 Muscle weakness (generalized): Secondary | ICD-10-CM | POA: Diagnosis present

## 2023-12-21 DIAGNOSIS — M25642 Stiffness of left hand, not elsewhere classified: Secondary | ICD-10-CM | POA: Diagnosis present

## 2023-12-21 DIAGNOSIS — G5601 Carpal tunnel syndrome, right upper limb: Secondary | ICD-10-CM | POA: Insufficient documentation

## 2023-12-25 ENCOUNTER — Ambulatory Visit: Attending: Surgery | Admitting: Occupational Therapy

## 2023-12-25 DIAGNOSIS — M6281 Muscle weakness (generalized): Secondary | ICD-10-CM | POA: Diagnosis present

## 2023-12-25 DIAGNOSIS — M25642 Stiffness of left hand, not elsewhere classified: Secondary | ICD-10-CM | POA: Diagnosis present

## 2023-12-25 DIAGNOSIS — M79642 Pain in left hand: Secondary | ICD-10-CM | POA: Diagnosis present

## 2023-12-25 DIAGNOSIS — L905 Scar conditions and fibrosis of skin: Secondary | ICD-10-CM | POA: Insufficient documentation

## 2023-12-25 DIAGNOSIS — G5601 Carpal tunnel syndrome, right upper limb: Secondary | ICD-10-CM | POA: Diagnosis present

## 2023-12-25 DIAGNOSIS — M25532 Pain in left wrist: Secondary | ICD-10-CM | POA: Diagnosis present

## 2023-12-25 NOTE — Therapy (Addendum)
 OUTPATIENT OCCUPATIONAL THERAPY ORTHO TREATMENT  Patient Name: Tonya Keith MRN: [id number] DOB:1961/01/11, 63 y.o., female Today's Date: 12/25/2023  PCP: Dr Idelia REFERRING PROVIDER: Dr Edie  END OF SESSION:  OT End of Session - 12/25/23 1034     Visit Number 2    Number of Visits 12    Date for OT Re-Evaluation 02/15/24    OT Start Time 1033    Activity Tolerance Patient tolerated treatment well    Behavior During Therapy [facility] for tasks assessed/performed          Past Medical History:  Diagnosis Date   Anemia    Anginal pain (HCC)    Aortic atherosclerosis (HCC)    Arthritis    Bilateral carpal tunnel syndrome    Bradycardia    Breast cancer, left (HCC) 2008   a.) s/p surgical resection (mastectomy) + systemic chemotherapy   Cardiac murmur    Cerebral tonsillar ectopia    COPD (chronic obstructive pulmonary disease) (HCC)    Coronary artery disease    a.) PCI 08/09/2007: 100% mRCA (2.25 x 23 mm Mini Vision BMS)   Depression    H. pylori infection    Hemoglobin C trait (HCC)    HLD (hyperlipidemia)    Hx of migraine headaches    Hypertension    Long-term use of aspirin  therapy    Marijuana use    Memory impairment    OSA (obstructive sleep apnea)    a.) does not require nocturnal PAP therapy   Osteopenia    a.) on oral bisphosphonate (alendronate)   Palpitations    Personal history of chemotherapy    Postural hypotension    Pulmonary granuloma (HCC)    Smoker    STEMI involving right coronary artery (HCC) 08/09/2007   a.) LHC/PCI 08/09/2007: 100% mRCA (2.5 x 23 mm Mini Vision BMS)   T2DM (type 2 diabetes mellitus) (HCC)    Trichomoniasis    Vitamin D  insufficiency    Wears dentures    full upper (currently unable to wear)   Past Surgical History:  Procedure Laterality Date   ABDOMINAL HYSTERECTOMY     ANKLE FRACTURE SURGERY Right    CARPAL TUNNEL RELEASE Left 01/27/2023   Procedure: CARPAL TUNNEL RELEASE;  Surgeon: Kathlynn Sharper, MD;  Location:  [facility];  Service: Orthopedics;  Laterality: Left;   CARPAL TUNNEL RELEASE Left 08/26/2023   Procedure: CARPAL TUNNEL RELEASE;  Surgeon: Edie Norleen PARAS, MD;  Location: [facility] ORS;  Service: Orthopedics;  Laterality: Left;   COLONOSCOPY N/A 11/30/2023   Procedure: COLONOSCOPY;  Surgeon: Maryruth Ole DASEN, MD;  Location: [facility];  Service: Endoscopy;  Laterality: N/A;   CORONARY ANGIOPLASTY WITH STENT PLACEMENT Left 08/09/2007   Procedure: CORONARY ANGIOPLASTY WITH STENT PLACEMENT; Location: [facility]; Surgeon: Margie Lovelace, MD   ESOPHAGOGASTRODUODENOSCOPY N/A 11/30/2023   Procedure: EGD (ESOPHAGOGASTRODUODENOSCOPY);  Surgeon: Maryruth Ole DASEN, MD;  Location: [facility] END[facility];  Service: Endoscopy;  Laterality: N/A;   LEFT HEART CATH AND CORONARY ANGIOGRAPHY Left 09/07/2019   Procedure: LEFT HEART CATH AND CORONARY ANGIOGRAPHY;  Surgeon: Lovelace Cara BIRCH, MD;  Location: [facility] INVASIVE CV LAB;  Service: Cardiovascular;  Laterality: Left;   LEFT HEART CATH AND CORONARY ANGIOGRAPHY Left 06/12/2010   Procedure: LEFT HEART CATH AND CORONARY ANGIOGRAPHY; Location: [facility]; Surgeon: Margie Lovelace, MD   LESION EXCISION WITH COMPLEX REPAIR Right 12/04/2020   Procedure: Excision right upper lip intraoral lesion;  Surgeon: Blair Mt, MD;  Location: [facility];  Service: ENT;  Laterality:  Right;  Latex   MASTECTOMY Left 2008   MINOR EXCISION OF ORAL LESION N/A 10/02/2020   Procedure: excision of right upper lip mass, intraoral;  Surgeon: Blair Mt, MD;  Location: [facility];  Service: ENT;  Laterality: N/A;  Latex   WISDOM TOOTH EXTRACTION     Patient Active Problem List   Diagnosis Date Noted   Osteoarthritis of hips (Bilateral) 11/03/2022   Lumbar radiculitis (L5, S1) (Bilateral) (L>R) 11/03/2022   Lumbosacral radiculopathy/radiculitis at S1 (Bilateral) (L>R) 11/03/2022   Lumbosacral radiculopathy/radiculitis at L5 (Bilateral) (L>R) 11/03/2022   Abnormal drug screen  (09/22/2022) 09/25/2022   Chronic hip pain (Bilateral) (R>L) 09/22/2022   Decreased range of motion of hips (Bilateral) (R>L) 09/22/2022   Chronic low back pain (2ry area of Pain) (Bilateral) (R>L) w/o sciatica 09/22/2022   Decreased range of motion of lumbar spine 09/22/2022   Generalized osteoarthritis of multiple sites 09/22/2022   History of marijuana use 09/22/2022   Heart murmur 09/21/2022   Myocardial infarction (HCC) 09/21/2022   Chronic pain syndrome 09/21/2022   Pharmacologic therapy 09/21/2022   Disorder of skeletal system 09/21/2022   Problems influencing health status 09/21/2022   Memory impairment 08/15/2022   Type 2 diabetes mellitus (HCC) 02/04/2022   Other insomnia 08/28/2019   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   Chronic obstructive pulmonary disease (HCC) 03/06/2019   Coronary artery disease involving native coronary artery of native heart without angina pectoris 03/06/2019   Major depressive disorder, recurrent, moderate (HCC) 03/06/2019   Malignant neoplasm of left female breast (HCC) 03/06/2019   Osteopenia 03/06/2019   Vitamin D  insufficiency 03/06/2019   Hypothermia 12/03/2017   Chronic pain of lower extremity (1ry area of Pain) (Bilateral) 11/17/2017   Bilateral lower extremity edema 11/17/2017   Moderate tobacco use disorder 11/17/2017   Hypertension 11/17/2017   Hypercholesterolemia 11/17/2017    ONSET DATE: 08/26/23  REFERRING DIAG: L  CTR revision  THERAPY DIAG:  Carpal tunnel syndrome, right upper limb  Pain in left hand  Pain in left wrist  Scar condition and fibrosis of skin  Muscle weakness (generalized)  Rationale for Evaluation and Treatment: Rehabilitation  SUBJECTIVE:   SUBJECTIVE STATEMENT: My hand is much better.  Look at my hand.  I have a little bit of pain still in my pinky and the side of my hand as well as over the scar.  But I have been doing what you told me.  My pain is down from a 8 to like maybe a 4 out of 10 Pt  accompanied by: self  PERTINENT HISTORY: Dr Edie appt 01/30/24 - refer to OT again - Lukisha Procida is a 63 y.o. female who presents for follow-up now 3 months status post a revision open left carpal tunnel release. The patient continues to note moderate to severe pain in her left wrist and hand with occasional radiation into the volar forearm region. She rates his pain as high as 8/10, and has been taking Tylenol  and soaking her hand in hot water with limited benefit. She did not attend formal occupational therapy as recommended at her last visit, stating that she called but they did not have any openings at the time. She has been trying to do some exercises on her own at home with limited benefit. She denies any reinjury to the wrist, and denies any fevers or chills.  Regional left carpal tunnel release for September 24  PRECAUTIONS: None     WEIGHT BEARING RESTRICTIONS: No  PAIN:  Are you having pain?  4/10 pain at the tip of pinkie and volar wrist   FALLS: Has patient fallen in last 6 months? No  LIVING ENVIRONMENT: Lives with: lives alone   PLOF: Independent in bathing and dressing as well as like cooking and laundry.  Some it takes her to get groceries.  Lives in the apartment.  1 step to get into  PATIENT GOALS: I want to get the pain better, my motion and strength so I can use it like my right hand -thank you help miliaris your  NEXT MD VISIT: ?  OBJECTIVE:  Note: Objective measures were completed at Evaluation unless otherwise noted.  HAND DOMINANCE: Right  ADLs: Unable to grip, squeeze, pick up, carry, push or pull with left hand  FUNCTIONAL OUTCOME MEASURES: PRWHE pain 42/50; function 26/50   UPPER EXTREMITY ROM:     Active ROM Right eval Left eval 12/25/23 L  Shoulder flexion     Shoulder abduction     Shoulder adduction     Shoulder extension     Shoulder internal rotation     Shoulder external rotation     Elbow flexion     Elbow extension     Wrist flexion   68 80  Wrist extension  65 68  Wrist ulnar deviation  30   Wrist radial deviation  25   Wrist pronation  90   Wrist supination  90    (Blank rows = not tested) Pain with all wrist AROM - 7-8/10 pain ulnar wrist and hand into the forearm and hand   Active ROM Right eval Left eval Left 12/25/23  Thumb MCP (0-60)     Thumb IP (0-80)     Thumb Radial abd/add (0-55)      Thumb Palmar abd/add (0-45)      Thumb Opposition to Small Finger      Index MCP (0-90)  80    Index PIP (0-100)  100    Index DIP (0-70)       Long MCP (0-90)   90    Long PIP (0-100)    100   Long DIP (0-70)       Ring MCP (0-90)    90   Ring PIP (0-100)   100    Ring DIP (0-70)       Little MCP (0-90)   90    Little PIP (0-100)   100    Little DIP (0-70)       (Blank rows = not tested)   HAND FUNCTION: Grip strength: Right: 56 lbs; Left: 19 lbs, Lateral pinch: Right: 14 lbs, Left: 8 lbs, and 3 point pinch: Right: 11 lbs, Left: 7 lbs Pain 7-8/10 on left hand with all grip and prehension 12/25/23 Grip strength: Right: 56 lbs; Left: 35 lbs, Lateral pinch: Right: 14 lbs, Left: 12 lbs, and 3 point pinch: Right: 11 lbs, Left: 8 lbs  COORDINATION: Decreased because of increased pain and range of motion  SENSATION: Patient report numbness in 2nd, 3rd and 4th digits will assess Semmes-Weinstein and neck session  EDEMA: Over the ulnar wrist  COGNITION: Overall cognitive status: Within functional limits for tasks assessed     TREATMENT DATE: 12/25/23           Patient return for first follow-up with great progress in pain as well as active range of motion in digits and wrist. Patient continues to be limited by volar wrist carpal tunnel scar as well as some  discomfort in the fifth digit and ulnar side of the hand into the wrist.                                                                                                                    Modalities: Paraffin Time: 8 Location: Left hand and wrist To  decrease pain and increased motion.  As well as done prior to soft tissue and scar mobilization to decrease scar tissue.   Patient to continue wearing Benik neoprene wrap for compression to decrease pain and edema at the ulnar wrist Reviewed with patient again scar massage to volar scar at the wrist as well as fitted with a Cica -Care scar pad for nighttime.  With a Tubigrip D for light compression. Scar mobilization done by OT manually and using mini massager as well as brushing with Graston tool #4.  Patient with increased scar tissue but responding really well with great improvement in wrist extension afterwards. Also done metacarpal spreads and Carpal spreads  Done Graston tool #6 for sweeping mostly over 4th and 5th volar digits ulnar side of the hand. Responded great on soft tissue with increased motion and decreased pain   Patient to continue with tendon glides pain-free focusing on motion more than forcing 12 reps Patient perform exercises with a right hand as well as watch the right hand while performing tendon glides.  Opposition to all digits 8 reps Add prayer stretch for composite wrist extension 10 reps pain-free Followed by table slides 20 reps added to home exercises pain-free to 3 times a day  Also reviewed 5 reps of medial nerve glide and added to home exercises 2 times a day Patient needed some verbal cueing not to force rotation and supination irritating ulnar side of the wrist.  Responded great in session with decreased pain and increased motion.       PATIENT EDUCATION: Education details: findings of eval and HEP  Person educated: Patient Education method: Explanation, Demonstration, Tactile cues, Verbal cues, and Handouts Education comprehension: verbalized understanding, returned demonstration, verbal cues required, and needs further education     GOALS: Goals reviewed with patient? Yes  LONG TERM GOALS: Target date: 8 wks   Pain in left hand decreased  to less than 2/10 with composite fisting as well as using left hand in bathing dressing Baseline: Pain 7-8/10 at volar wrist ulnar hand and fifth digit.  With any attempts of flexion. Goal status: INITIAL  2.  Left wrist and forearm AROM improve within normal limits symptom-free to use in bathing and dressing Baseline: AROM at the wrist left 65 extension 68 flexion forearm or radial ulnar deviation within normal limits but pain 7-8/10 at wrist ulnarly and hand and fifth digit Goal status: INITIAL  3.  Left grip and prehension strength improved within normal range age for patient symptom-free to be able to squeeze a washcloth pick up a drink pull up pants perform hygiene Baseline:  Grip strength: Right: 56 lbs; Left: 19 lbs, Lateral pinch: Right: 14  lbs, Left: 8 lbs, and 3 point pinch: Right: 11 lbs, Left: 7 lbs Goal status: INITIAL  4.  Left hand and wrist improved for patient to be able to push and pull door, turn a doorknob cut food open container symptom-free Baseline: Pain 7-8/10 with any attempts of making composite fist as well as wrist flexion extension decreased pain at volar and ulnar wrist as well as ulnar hand and fifth digit Goal status: INITIAL   4.  Left hand and wrist improve on PRWHE by more than 30 points  Baseline: Pain on PRWHE at evaluation 42/50  goal status: INITIAL   4.  Patient function of using left hand on PRWHE improve with more than 15 points Baseline: PRWHE score for function at eval is 26/50  Goal status: INITIAL    ASSESSMENT:  CLINICAL IMPRESSION: Patient seen today for occupational therapy evaluation for left carpal tunnel revision on 08/26/2023 by Dr.Poggi -patient present at OT evaluation with pain 7-8/10 at the ulnar and volar wrist as well as ulnar hand and fifth digit.  Patient very protective and guarding of left hand use and attempts of range of motion.  Patient limited in wrist flexion extension as well as grip and prehension strength.  NOW  patient seen for first follow-up after evaluation with great improvement with pain decreasing from a 8 to a 4/10.  Increase motion increase grip and prevention strength.  Patient continues to have some discomfort and pain on the fifth digit, ulnar side of the hand and wrist as well as increased scar tissue with increased discomfort and pain and decreased wrist extension.  Patient responded really good on session and show a decrease pain decrease scar tissue increase motion.  Patient limited in functional use of left hand in ADLs and IADLs.  Patient can benefit from skilled OT services to decrease scar tissue, decrease edema and pain and increase motion and strength to return to prior level of function.  PERFORMANCE DEFICITS: in functional skills including ADLs, IADLs, ROM, strength, pain, flexibility, decreased knowledge of use of DME, and UE functional use,   and psychosocial skills including environmental adaptation and routines and behaviors.   IMPAIRMENTS: are limiting patient from ADLs, IADLs, rest and sleep, play, leisure, and social participation.   COMORBIDITIES: has no other co-morbidities that affects occupational performance. Patient will benefit from skilled OT to address above impairments and improve overall function.  MODIFICATION OR ASSISTANCE TO COMPLETE EVALUATION: No modification of tasks or assist necessary to complete an evaluation.  OT OCCUPATIONAL PROFILE AND HISTORY: Problem focused assessment: Including review of records relating to presenting problem.  CLINICAL DECISION MAKING: LOW - limited treatment options, no task modification necessary  REHAB POTENTIAL: Good for goals  EVALUATION COMPLEXITY: Low      PLAN:  OT FREQUENCY: 1-2x/week  OT DURATION: 8 weeks  PLANNED INTERVENTIONS: 97168 OT Re-evaluation, 97535 self care/ADL training, 02889 therapeutic exercise, 97530 therapeutic activity, 97140 manual therapy, 97035 ultrasound, 97018 paraffin, 02960 fluidotherapy,  97034 contrast bath, scar mobilization, passive range of motion, patient/family education, and DME and/or AE instructions    CONSULTED AND AGREED WITH PLAN OF CARE: Patient     Ancel Peters, OTR/L,CLT 12/25/2023, 10:35 AM

## 2023-12-29 ENCOUNTER — Ambulatory Visit: Admitting: Occupational Therapy

## 2023-12-29 DIAGNOSIS — M79642 Pain in left hand: Secondary | ICD-10-CM

## 2023-12-29 DIAGNOSIS — M6281 Muscle weakness (generalized): Secondary | ICD-10-CM

## 2023-12-29 DIAGNOSIS — L905 Scar conditions and fibrosis of skin: Secondary | ICD-10-CM

## 2023-12-29 DIAGNOSIS — G5601 Carpal tunnel syndrome, right upper limb: Secondary | ICD-10-CM | POA: Diagnosis not present

## 2023-12-29 DIAGNOSIS — M25532 Pain in left wrist: Secondary | ICD-10-CM

## 2023-12-29 NOTE — Therapy (Signed)
 OUTPATIENT OCCUPATIONAL THERAPY ORTHO TREATMENT  Patient Name: Tonya Keith MRN: [id number] DOB:05/18/1961, 63 y.o., female Today's Date: 12/29/2023  PCP: Dr Idelia REFERRING PROVIDER: Dr Edie  END OF SESSION:  OT End of Session - 12/29/23 1315     Visit Number 3    Number of Visits 12    Date for OT Re-Evaluation 02/15/24    OT Start Time 1315    OT Stop Time 1357    OT Time Calculation (min) 42 min    Activity Tolerance Patient tolerated treatment well    Behavior During Therapy [facility] for tasks assessed/performed          Past Medical History:  Diagnosis Date   Anemia    Anginal pain (HCC)    Aortic atherosclerosis (HCC)    Arthritis    Bilateral carpal tunnel syndrome    Bradycardia    Breast cancer, left (HCC) 2008   a.) s/p surgical resection (mastectomy) + systemic chemotherapy   Cardiac murmur    Cerebral tonsillar ectopia    COPD (chronic obstructive pulmonary disease) (HCC)    Coronary artery disease    a.) PCI 08/09/2007: 100% mRCA (2.25 x 23 mm Mini Vision BMS)   Depression    H. pylori infection    Hemoglobin C trait (HCC)    HLD (hyperlipidemia)    Hx of migraine headaches    Hypertension    Long-term use of aspirin  therapy    Marijuana use    Memory impairment    OSA (obstructive sleep apnea)    a.) does not require nocturnal PAP therapy   Osteopenia    a.) on oral bisphosphonate (alendronate)   Palpitations    Personal history of chemotherapy    Postural hypotension    Pulmonary granuloma (HCC)    Smoker    STEMI involving right coronary artery (HCC) 08/09/2007   a.) LHC/PCI 08/09/2007: 100% mRCA (2.5 x 23 mm Mini Vision BMS)   T2DM (type 2 diabetes mellitus) (HCC)    Trichomoniasis    Vitamin D  insufficiency    Wears dentures    full upper (currently unable to wear)   Past Surgical History:  Procedure Laterality Date   ABDOMINAL HYSTERECTOMY     ANKLE FRACTURE SURGERY Right    CARPAL TUNNEL RELEASE Left 01/27/2023   Procedure: CARPAL  TUNNEL RELEASE;  Surgeon: Kathlynn Sharper, MD;  Location: [facility];  Service: Orthopedics;  Laterality: Left;   CARPAL TUNNEL RELEASE Left 08/26/2023   Procedure: CARPAL TUNNEL RELEASE;  Surgeon: Edie Norleen PARAS, MD;  Location: [facility];  Service: Orthopedics;  Laterality: Left;   COLONOSCOPY N/A 11/30/2023   Procedure: COLONOSCOPY;  Surgeon: Maryruth Ole DASEN, MD;  Location: [facility];  Service: Endoscopy;  Laterality: N/A;   CORONARY ANGIOPLASTY WITH STENT PLACEMENT Left 08/09/2007   Procedure: CORONARY ANGIOPLASTY WITH STENT PLACEMENT; Location: [facility]; Surgeon: Margie Lovelace, MD   ESOPHAGOGASTRODUODENOSCOPY N/A 11/30/2023   Procedure: EGD (ESOPHAGOGASTRODUODENOSCOPY);  Surgeon: Maryruth Ole DASEN, MD;  Location: [facility];  Service: Endoscopy;  Laterality: N/A;   LEFT HEART CATH AND CORONARY ANGIOGRAPHY Left 09/07/2019   Procedure: LEFT HEART CATH AND CORONARY ANGIOGRAPHY;  Surgeon: Lovelace Cara BIRCH, MD;  Location: [facility];  Service: Cardiovascular;  Laterality: Left;   LEFT HEART CATH AND CORONARY ANGIOGRAPHY Left 06/12/2010   Procedure: LEFT HEART CATH AND CORONARY ANGIOGRAPHY; Location: [facility]; Surgeon: Margie Lovelace, MD   LESION EXCISION WITH COMPLEX REPAIR Right 12/04/2020   Procedure: Excision right upper lip intraoral  lesion;  Surgeon: Blair Mt, MD;  Location: [facility];  Service: ENT;  Laterality: Right;  Latex   MASTECTOMY Left 2008   MINOR EXCISION OF ORAL LESION N/A 10/02/2020   Procedure: excision of right upper lip mass, intraoral;  Surgeon: Blair Mt, MD;  Location: [facility];  Service: ENT;  Laterality: N/A;  Latex   WISDOM TOOTH EXTRACTION     Patient Active Problem List   Diagnosis Date Noted   Osteoarthritis of hips (Bilateral) 11/03/2022   Lumbar radiculitis (L5, S1) (Bilateral) (L>R) 11/03/2022   Lumbosacral radiculopathy/radiculitis at S1 (Bilateral) (L>R) 11/03/2022   Lumbosacral radiculopathy/radiculitis at  L5 (Bilateral) (L>R) 11/03/2022   Abnormal drug screen (09/22/2022) 09/25/2022   Chronic hip pain (Bilateral) (R>L) 09/22/2022   Decreased range of motion of hips (Bilateral) (R>L) 09/22/2022   Chronic low back pain (2ry area of Pain) (Bilateral) (R>L) w/o sciatica 09/22/2022   Decreased range of motion of lumbar spine 09/22/2022   Generalized osteoarthritis of multiple sites 09/22/2022   History of marijuana use 09/22/2022   Heart murmur 09/21/2022   Myocardial infarction (HCC) 09/21/2022   Chronic pain syndrome 09/21/2022   Pharmacologic therapy 09/21/2022   Disorder of skeletal system 09/21/2022   Problems influencing health status 09/21/2022   Memory impairment 08/15/2022   Type 2 diabetes mellitus (HCC) 02/04/2022   Other insomnia 08/28/2019   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   Chronic obstructive pulmonary disease (HCC) 03/06/2019   Coronary artery disease involving native coronary artery of native heart without angina pectoris 03/06/2019   Major depressive disorder, recurrent, moderate (HCC) 03/06/2019   Malignant neoplasm of left female breast (HCC) 03/06/2019   Osteopenia 03/06/2019   Vitamin D  insufficiency 03/06/2019   Hypothermia 12/03/2017   Chronic pain of lower extremity (1ry area of Pain) (Bilateral) 11/17/2017   Bilateral lower extremity edema 11/17/2017   Moderate tobacco use disorder 11/17/2017   Hypertension 11/17/2017   Hypercholesterolemia 11/17/2017    ONSET DATE: 08/26/23  REFERRING DIAG: L  CTR revision  THERAPY DIAG:  Carpal tunnel syndrome, right upper limb  Pain in left hand  Pain in left wrist  Scar condition and fibrosis of skin  Muscle weakness (generalized)  Rationale for Evaluation and Treatment: Rehabilitation  SUBJECTIVE:   SUBJECTIVE STATEMENT: This rain bothers my arthritis little bit.  The side of my hand is a little sore.  But my pain is still better than last time. More flexibility.  And I use it more.  I do not have numbness  my pinky anymore. Pt accompanied by: self  PERTINENT HISTORY: Dr Edie appt 01/30/24 - refer to OT again - Tonya Keith is a 63 y.o. female who presents for follow-up now 3 months status post a revision open left carpal tunnel release. The patient continues to note moderate to severe pain in her left wrist and hand with occasional radiation into the volar forearm region. She rates his pain as high as 8/10, and has been taking Tylenol  and soaking her hand in hot water with limited benefit. She did not attend formal occupational therapy as recommended at her last visit, stating that she called but they did not have any openings at the time. She has been trying to do some exercises on her own at home with limited benefit. She denies any reinjury to the wrist, and denies any fevers or chills.  Regional left carpal tunnel release for September 24  PRECAUTIONS: None     WEIGHT BEARING RESTRICTIONS: No  PAIN:  Are you having pain?  4/10 pain at the ulnar side of the hand  FALLS: Has patient fallen in last 6 months? No  LIVING ENVIRONMENT: Lives with: lives alone   PLOF: Independent in bathing and dressing as well as like cooking and laundry.  Some it takes her to get groceries.  Lives in the apartment.  1 step to get into  PATIENT GOALS: I want to get the pain better, my motion and strength so I can use it like my right hand -thank you help miliaris your  NEXT MD VISIT: ?  OBJECTIVE:  Note: Objective measures were completed at Evaluation unless otherwise noted.  HAND DOMINANCE: Right  ADLs: Unable to grip, squeeze, pick up, carry, push or pull with left hand  FUNCTIONAL OUTCOME MEASURES: PRWHE pain 42/50; function 26/50   UPPER EXTREMITY ROM:     Active ROM Right eval Left eval 12/25/23 L 8/5L/25  Shoulder flexion      Shoulder abduction      Shoulder adduction      Shoulder extension      Shoulder internal rotation      Shoulder external rotation      Elbow flexion      Elbow  extension      Wrist flexion  68 80 100  Wrist extension  65 68 75  Wrist ulnar deviation  30    Wrist radial deviation  25    Wrist pronation  90    Wrist supination  90     (Blank rows = not tested) Pain with all wrist AROM - 7-8/10 pain ulnar wrist and hand into the forearm and hand   Active ROM Right eval Left eval Left 12/25/23  Thumb MCP (0-60)     Thumb IP (0-80)     Thumb Radial abd/add (0-55)      Thumb Palmar abd/add (0-45)      Thumb Opposition to Small Finger      Index MCP (0-90)  80    Index PIP (0-100)  100    Index DIP (0-70)       Long MCP (0-90)   90    Long PIP (0-100)    100   Long DIP (0-70)       Ring MCP (0-90)    90   Ring PIP (0-100)   100    Ring DIP (0-70)       Little MCP (0-90)   90    Little PIP (0-100)   100    Little DIP (0-70)       (Blank rows = not tested)   HAND FUNCTION: Grip strength: Right: 56 lbs; Left: 19 lbs, Lateral pinch: Right: 14 lbs, Left: 8 lbs, and 3 point pinch: Right: 11 lbs, Left: 7 lbs Pain 7-8/10 on left hand with all grip and prehension 12/25/23 Grip strength: Right: 56 lbs; Left: 35 lbs, Lateral pinch: Right: 14 lbs, Left: 12 lbs, and 3 point pinch: Right: 11 lbs, Left: 8 lbs  COORDINATION: Decreased because of increased pain and range of motion  SENSATION: Patient report numbness in 2nd, 3rd and 4th digits will assess Semmes-Weinstein and neck session  EDEMA: Over the ulnar wrist  COGNITION: Overall cognitive status: Within functional limits for tasks assessed     TREATMENT DATE: 12/29/23          Patient continues to have full digit flexion pain-free opposition to all digits to distal palmar crease slight pull at the thumb Wrist flexion extension  improved greatly. Pain continues to be a 4/10 but mostly on the ulnar side of the wrist.  Patient report some arthritis pain with increased rain today Scar tissue improving.                                                                                                             Modalities: Paraffin Time: 8 Location: Left hand and wrist To decrease pain and increased motion.  As well as done prior to soft tissue and scar mobilization to decrease scar tissue.   Patient to continue wearing Benik neoprene wrap for compression to decrease pain and edema at the ulnar wrist Reviewed with patient again scar massage to volar scar at the wrist as well as fitted with a Cica -Care scar pad for nighttime at eval.  With a Tubigrip D for light compression. Scar mobilization done by OT manually and using mini massager and extractor  Graston tool #4 brushing done.  Patient with increased scar tissue but responding really well with great improvement in wrist extension afterwards. Pt maintain progress in wrist ext and flexion from last visit  Also done metacarpal spreads and Carpal spreads done  Done Graston tool #6 for sweeping mostly over 4th and 5th volar digits ulnar side of the hand. Responded great on soft tissue with increased motion and decreased pain Done soft tissue facilitation with palm on medium putty rolling on all directions and palm and arches with light pressure in all directions 5 reps Tolerating well   Patient to continue with tendon glides pain-free focusing on motion more than forcing 12 reps Patient perform exercises with a right hand as well as watch the right hand while performing tendon glides.  Opposition to all digits 8 reps Cont prayer stretch for composite wrist extension 10 reps pain-free Followed by table slides 20 reps added to home exercises pain-free to 3 times a day  Also reviewed 5 reps of medial nerve glide and added to home exercises 2 times a day Patient needed some verbal cueing not to force rotation and supination irritating ulnar side of the wrist.  Responded great in session with decreased pain and increased motion.       PATIENT EDUCATION: Education details: findings of eval and HEP  Person educated:  Patient Education method: Explanation, Demonstration, Tactile cues, Verbal cues, and Handouts Education comprehension: verbalized understanding, returned demonstration, verbal cues required, and needs further education     GOALS: Goals reviewed with patient? Yes  LONG TERM GOALS: Target date: 8 wks   Pain in left hand decreased to less than 2/10 with composite fisting as well as using left hand in bathing dressing Baseline: Pain 7-8/10 at volar wrist ulnar hand and fifth digit.  With any attempts of flexion. Goal status: INITIAL  2.  Left wrist and forearm AROM improve within normal limits symptom-free to use in bathing and dressing Baseline: AROM at the wrist left 65 extension 68 flexion forearm or radial ulnar deviation within normal limits but pain 7-8/10 at wrist ulnarly and hand and fifth  digit Goal status: INITIAL  3.  Left grip and prehension strength improved within normal range age for patient symptom-free to be able to squeeze a washcloth pick up a drink pull up pants perform hygiene Baseline:  Grip strength: Right: 56 lbs; Left: 19 lbs, Lateral pinch: Right: 14 lbs, Left: 8 lbs, and 3 point pinch: Right: 11 lbs, Left: 7 lbs Goal status: INITIAL  4.  Left hand and wrist improved for patient to be able to push and pull door, turn a doorknob cut food open container symptom-free Baseline: Pain 7-8/10 with any attempts of making composite fist as well as wrist flexion extension decreased pain at volar and ulnar wrist as well as ulnar hand and fifth digit Goal status: INITIAL   4.  Left hand and wrist improve on PRWHE by more than 30 points  Baseline: Pain on PRWHE at evaluation 42/50  goal status: INITIAL   4.  Patient function of using left hand on PRWHE improve with more than 15 points Baseline: PRWHE score for function at eval is 26/50  Goal status: INITIAL    ASSESSMENT:  CLINICAL IMPRESSION: Patient seen today for occupational therapy evaluation for left carpal  tunnel revision on 08/26/2023 by Dr.Poggi -patient present at OT evaluation with pain 7-8/10 at the ulnar and volar wrist as well as ulnar hand and fifth digit.  Patient very protective and guarding of left hand use and attempts of range of motion.  Patient limited in wrist flexion extension as well as grip and prehension strength.  NOW patient with great improvement with pain decreasing from a 8 to a 4/10.  Increase motion in digits , thumb and wrist - with decrease pain -  increase grip and prehension strength.  Patient continues to have some discomfort and pain on the fifth digit, ulnar side of the hand and wrist as well as increased scar tissue with increased discomfort and pain  Patient responded really good on session and show a decrease pain decrease scar tissue increase motion.  Patient limited in functional use of left hand in ADLs and IADLs.  Patient can benefit from skilled OT services to decrease scar tissue, decrease edema and pain and increase motion and strength to return to prior level of function.  PERFORMANCE DEFICITS: in functional skills including ADLs, IADLs, ROM, strength, pain, flexibility, decreased knowledge of use of DME, and UE functional use,   and psychosocial skills including environmental adaptation and routines and behaviors.   IMPAIRMENTS: are limiting patient from ADLs, IADLs, rest and sleep, play, leisure, and social participation.   COMORBIDITIES: has no other co-morbidities that affects occupational performance. Patient will benefit from skilled OT to address above impairments and improve overall function.  MODIFICATION OR ASSISTANCE TO COMPLETE EVALUATION: No modification of tasks or assist necessary to complete an evaluation.  OT OCCUPATIONAL PROFILE AND HISTORY: Problem focused assessment: Including review of records relating to presenting problem.  CLINICAL DECISION MAKING: LOW - limited treatment options, no task modification necessary  REHAB POTENTIAL: Good for  goals  EVALUATION COMPLEXITY: Low      PLAN:  OT FREQUENCY: 1-2x/week  OT DURATION: 8 weeks  PLANNED INTERVENTIONS: 97168 OT Re-evaluation, 97535 self care/ADL training, 02889 therapeutic exercise, 97530 therapeutic activity, 97140 manual therapy, 97035 ultrasound, 97018 paraffin, 02960 fluidotherapy, 97034 contrast bath, scar mobilization, passive range of motion, patient/family education, and DME and/or AE instructions    CONSULTED AND AGREED WITH PLAN OF CARE: Patient     Ancel Peters, OTR/L,CLT 12/29/2023, 2:00 PM

## 2023-12-31 ENCOUNTER — Ambulatory Visit: Admitting: Occupational Therapy

## 2023-12-31 DIAGNOSIS — G5601 Carpal tunnel syndrome, right upper limb: Secondary | ICD-10-CM | POA: Diagnosis not present

## 2023-12-31 DIAGNOSIS — M25532 Pain in left wrist: Secondary | ICD-10-CM

## 2023-12-31 DIAGNOSIS — L905 Scar conditions and fibrosis of skin: Secondary | ICD-10-CM

## 2023-12-31 DIAGNOSIS — M6281 Muscle weakness (generalized): Secondary | ICD-10-CM

## 2023-12-31 DIAGNOSIS — M79642 Pain in left hand: Secondary | ICD-10-CM

## 2023-12-31 NOTE — Therapy (Signed)
 OUTPATIENT OCCUPATIONAL THERAPY ORTHO TREATMENT  Patient Name: Tonya Keith MRN: [id number] DOB:1961-04-26, 63 y.o., female Today's Date: 12/31/2023  PCP: Dr Tonya Keith REFERRING PROVIDER: Dr Tonya Keith  END OF SESSION:  OT End of Session - 12/31/23 1413     Visit Number 4    Number of Visits 12    Date for OT Re-Evaluation 02/15/24    OT Start Time 1401    OT Stop Time 1444    OT Time Calculation (min) 43 min    Activity Tolerance Patient tolerated treatment well    Behavior During Therapy WFL for tasks assessed/performed          Past Medical History:  Diagnosis Date   Anemia    Anginal pain (HCC)    Aortic atherosclerosis (HCC)    Arthritis    Bilateral carpal tunnel syndrome    Bradycardia    Breast cancer, left (HCC) 2008   a.) s/p surgical resection (mastectomy) + systemic chemotherapy   Cardiac murmur    Cerebral tonsillar ectopia    COPD (chronic obstructive pulmonary disease) (HCC)    Coronary artery disease    a.) PCI 08/09/2007: 100% mRCA (2.25 x 23 mm Mini Vision BMS)   Depression    H. pylori infection    Hemoglobin C trait (HCC)    HLD (hyperlipidemia)    Hx of migraine headaches    Hypertension    Long-term use of aspirin  therapy    Marijuana use    Memory impairment    OSA (obstructive sleep apnea)    a.) does not require nocturnal PAP therapy   Osteopenia    a.) on oral bisphosphonate (alendronate)   Palpitations    Personal history of chemotherapy    Postural hypotension    Pulmonary granuloma (HCC)    Smoker    STEMI involving right coronary artery (HCC) 08/09/2007   a.) LHC/PCI 08/09/2007: 100% mRCA (2.5 x 23 mm Mini Vision BMS)   T2DM (type 2 diabetes mellitus) (HCC)    Trichomoniasis    Vitamin D  insufficiency    Wears dentures    full upper (currently unable to wear)   Past Surgical History:  Procedure Laterality Date   ABDOMINAL HYSTERECTOMY     ANKLE FRACTURE SURGERY Right    CARPAL TUNNEL RELEASE Left 01/27/2023   Procedure: CARPAL  TUNNEL RELEASE;  Surgeon: Tonya Sharper, MD;  Location: [facility];  Service: Orthopedics;  Laterality: Left;   CARPAL TUNNEL RELEASE Left 08/26/2023   Procedure: CARPAL TUNNEL RELEASE;  Surgeon: Tonya Keith Norleen PARAS, MD;  Location: [facility] OR[facility];  Service: Orthopedics;  Laterality: Left;   COLONOSCOPY N/A 11/30/2023   Procedure: COLONOSCOPY;  Surgeon: Tonya Ole DASEN, MD;  Location: [facility] ENDOS[facility];  Service: Endoscopy;  Laterality: N/A;   CORONARY ANGIOPLASTY WITH STENT PLACEMENT Left 08/09/2007   Procedure: CORONARY ANGIOPLASTY WITH STENT PLACEMENT; Location: [facility]; Surgeon: Tonya Lovelace, MD   ESOPHAGOGASTRODUODENOSCOPY N/A 11/30/2023   Procedure: EGD (ESOPHAGOGASTRODUODENOSCOPY);  Surgeon: Tonya Ole DASEN, MD;  Location: [facility] ENDOSCOPY;  Service: Endoscopy;  Laterality: N/A;   LEFT HEART CATH AND CORONARY ANGIOGRAPHY Left 09/07/2019   Procedure: LEFT HEART CATH AND CORONARY ANGIOGRAPHY;  Surgeon: Keith Cara BIRCH, MD;  Location: [facility];  Service: Cardiovascular;  Laterality: Left;   LEFT HEART CATH AND CORONARY ANGIOGRAPHY Left 06/12/2010   Procedure: LEFT HEART CATH AND CORONARY ANGIOGRAPHY; Location: [facility]; Surgeon: Tonya Lovelace, MD   LESION EXCISION WITH COMPLEX REPAIR Right 12/04/2020   Procedure: Excision right upper lip intraoral  lesion;  Surgeon: Tonya Mt, MD;  Location: [facility];  Service: ENT;  Laterality: Right;  Latex   MASTECTOMY Left 2008   MINOR EXCISION OF ORAL LESION N/A 10/02/2020   Procedure: excision of right upper lip mass, intraoral;  Surgeon: Tonya Mt, MD;  Location: [facility];  Service: ENT;  Laterality: N/A;  Latex   WISDOM TOOTH EXTRACTION     Patient Active Problem List   Diagnosis Date Noted   Osteoarthritis of hips (Bilateral) 11/03/2022   Lumbar radiculitis (L5, S1) (Bilateral) (L>R) 11/03/2022   Lumbosacral radiculopathy/radiculitis at S1 (Bilateral) (L>R) 11/03/2022   Lumbosacral radiculopathy/radiculitis at  L5 (Bilateral) (L>R) 11/03/2022   Abnormal drug screen (09/22/2022) 09/25/2022   Chronic hip pain (Bilateral) (R>L) 09/22/2022   Decreased range of motion of hips (Bilateral) (R>L) 09/22/2022   Chronic low back pain (2ry area of Pain) (Bilateral) (R>L) w/o sciatica 09/22/2022   Decreased range of motion of lumbar spine 09/22/2022   Generalized osteoarthritis of multiple sites 09/22/2022   History of marijuana use 09/22/2022   Heart murmur 09/21/2022   Myocardial infarction (HCC) 09/21/2022   Chronic pain syndrome 09/21/2022   Pharmacologic therapy 09/21/2022   Disorder of skeletal system 09/21/2022   Problems influencing health status 09/21/2022   Memory impairment 08/15/2022   Type 2 diabetes mellitus (HCC) 02/04/2022   Other insomnia 08/28/2019   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   Chronic obstructive pulmonary disease (HCC) 03/06/2019   Coronary artery disease involving native coronary artery of native heart without angina pectoris 03/06/2019   Major depressive disorder, recurrent, moderate (HCC) 03/06/2019   Malignant neoplasm of left female breast (HCC) 03/06/2019   Osteopenia 03/06/2019   Vitamin D  insufficiency 03/06/2019   Hypothermia 12/03/2017   Chronic pain of lower extremity (1ry area of Pain) (Bilateral) 11/17/2017   Bilateral lower extremity edema 11/17/2017   Moderate tobacco use disorder 11/17/2017   Hypertension 11/17/2017   Hypercholesterolemia 11/17/2017    ONSET DATE: 08/26/23  REFERRING DIAG: L  CTR revision  THERAPY DIAG:  Carpal tunnel syndrome, right upper limb  Pain in left hand  Pain in left wrist  Scar condition and fibrosis of skin  Muscle weakness (generalized)  Rationale for Evaluation and Treatment: Rehabilitation  SUBJECTIVE:   SUBJECTIVE STATEMENT: My hand is so much better.  The scar in the pain over my scar is better.  That side of my hand on the pinky side is maybe little bit sore still on the bottom but 4/10   pt accompanied  by: self  PERTINENT HISTORY: Dr Tonya Keith appt 01/30/24 - refer to OT again - Tonya Keith is a 63 y.o. female who presents for follow-up now 3 months status post a revision open left carpal tunnel release. The patient continues to note moderate to severe pain in her left wrist and hand with occasional radiation into the volar forearm region. She rates his pain as high as 8/10, and has been taking Tylenol  and soaking her hand in hot water with limited benefit. She did not attend formal occupational therapy as recommended at her last visit, stating that she called but they did not have any openings at the time. She has been trying to do some exercises on her own at home with limited benefit. She denies any reinjury to the wrist, and denies any fevers or chills.  Regional left carpal tunnel release for September 24  PRECAUTIONS: None     WEIGHT BEARING RESTRICTIONS: No  PAIN:  Are you having pain?  4/10 pain at the  base ulnar side of the hand  FALLS: Has patient fallen in last 6 months? No  LIVING ENVIRONMENT: Lives with: lives alone   PLOF: Independent in bathing and dressing as well as like cooking and laundry.  Some it takes her to get groceries.  Lives in the apartment.  1 step to get into  PATIENT GOALS: I want to get the pain better, my motion and strength so I can use it like my right hand -thank you help miliaris your  NEXT MD VISIT: ?  OBJECTIVE:  Note: Objective measures were completed at Evaluation unless otherwise noted.  HAND DOMINANCE: Right  ADLs: Unable to grip, squeeze, pick up, carry, push or pull with left hand  FUNCTIONAL OUTCOME MEASURES: PRWHE pain 42/50; function 26/50   UPPER EXTREMITY ROM:     Active ROM Right eval Left eval 12/25/23 L 8/5L/25  Shoulder flexion      Shoulder abduction      Shoulder adduction      Shoulder extension      Shoulder internal rotation      Shoulder external rotation      Elbow flexion      Elbow extension      Wrist flexion   68 80 100  Wrist extension  65 68 75  Wrist ulnar deviation  30    Wrist radial deviation  25    Wrist pronation  90    Wrist supination  90     (Blank rows = not tested) Pain with all wrist AROM - 7-8/10 pain ulnar wrist and hand into the forearm and hand   Active ROM Right eval Left eval Left 12/25/23  Thumb MCP (0-60)     Thumb IP (0-80)     Thumb Radial abd/add (0-55)      Thumb Palmar abd/add (0-45)      Thumb Opposition to Small Finger      Index MCP (0-90)  80    Index PIP (0-100)  100    Index DIP (0-70)       Long MCP (0-90)   90    Long PIP (0-100)    100   Long DIP (0-70)       Ring MCP (0-90)    90   Ring PIP (0-100)   100    Ring DIP (0-70)       Little MCP (0-90)   90    Little PIP (0-100)   100    Little DIP (0-70)       (Blank rows = not tested)   HAND FUNCTION: Grip strength: Right: 56 lbs; Left: 19 lbs, Lateral pinch: Right: 14 lbs, Left: 8 lbs, and 3 point pinch: Right: 11 lbs, Left: 7 lbs Pain 7-8/10 on left hand with all grip and prehension 12/25/23 Grip strength: Right: 56 lbs; Left: 35 lbs, Lateral pinch: Right: 14 lbs, Left: 12 lbs, and 3 point pinch: Right: 11 lbs, Left: 8 lbs  COORDINATION: Decreased because of increased pain and range of motion  SENSATION: Patient report numbness in 2nd, 3rd and 4th digits will assess Semmes-Weinstein and neck session  EDEMA: Over the ulnar wrist  COGNITION: Overall cognitive status: Within functional limits for tasks assessed     TREATMENT DATE: 12/31/23          Patient continues to have full digit flexion pain-free opposition to all digits Wrist flexion extension improved greatly. Still feels a sting at the proximal scar with composite  wrist extension with a small scar adhesion Pain continues to be a 4/10 but mostly on the ulnar side of the wrist.   Scar tissue improving.                                                                                                             Modalities: Fluidotherapy Time: 8 Location: Left hand and wrist To decrease pain and increased motion.  As well as done prior to soft tissue and scar mobilization    Patient to continue wearing Benik neoprene wrap for compression to decrease pain and edema at the ulnar wrist Reviewed with patient again scar massage to volar scar at the wrist as well as fitted with a Cica -Care scar pad for nighttime at eval.  With a Tubigrip D for light compression. Scar mobilization done by OT manually and using mini massager and extractor  Graston tool #4 brushing done and sweeping over the volar forearm and wrist prior to wrist extension.  Patient with increased scar tissue but responding really well with great improvement in wrist extension afterwards. Pt maintain progress in wrist ext and flexion from last visit  Also done metacarpal spreads and Carpal spreads done  Done Graston tool #6 for sweeping mostly over 4th and 5th volar digits ulnar side of the hand. Responded great on soft tissue with increased motion and decreased pain Done soft tissue facilitation with palm on medium putty rolling on all directions and palm and arches with light pressure in all directions 5 reps Tolerating well Patient is using red foam roller for soft tissue massage.  Over the palm and the forearm added this date For scar mobilization done Kinesiotape with 100% pull in the X over the proximal carpal scar for scar mobilization and use during the day.  Provided patient with extra to use during the day over the weekend.   Patient to continue with tendon glides pain-free focusing on motion more than forcing 12 reps Patient perform exercises with a right hand as well as watch the right hand while performing tendon glides.  Opposition to all digits 8 reps Cont prayer stretch for composite wrist extension 10 reps pain-free Followed by table slides 20 reps added to home exercises pain-free to 3 times a day This date done Wall  push-ups.  Patient was able to do 10 reps half range pain-free no pain over the scar.  Also reviewed 5 reps of medial nerve glide and added to home exercises 2 times a day Patient needed some verbal cueing not to force rotation and supination irritating ulnar side of the wrist.  Responded great in session with decreased pain and increased motion.       PATIENT EDUCATION: Education details: findings of eval and HEP  Person educated: Patient Education method: Explanation, Demonstration, Tactile cues, Verbal cues, and Handouts Education comprehension: verbalized understanding, returned demonstration, verbal cues required, and needs further education     GOALS: Goals reviewed with patient? Yes  LONG TERM GOALS: Target date: 8 wks   Pain in left hand  decreased to less than 2/10 with composite fisting as well as using left hand in bathing dressing Baseline: Pain 7-8/10 at volar wrist ulnar hand and fifth digit.  With any attempts of flexion. Goal status: INITIAL  2.  Left wrist and forearm AROM improve within normal limits symptom-free to use in bathing and dressing Baseline: AROM at the wrist left 65 extension 68 flexion forearm or radial ulnar deviation within normal limits but pain 7-8/10 at wrist ulnarly and hand and fifth digit Goal status: INITIAL  3.  Left grip and prehension strength improved within normal range age for patient symptom-free to be able to squeeze a washcloth pick up a drink pull up pants perform hygiene Baseline:  Grip strength: Right: 56 lbs; Left: 19 lbs, Lateral pinch: Right: 14 lbs, Left: 8 lbs, and 3 point pinch: Right: 11 lbs, Left: 7 lbs Goal status: INITIAL  4.  Left hand and wrist improved for patient to be able to push and pull door, turn a doorknob cut food open container symptom-free Baseline: Pain 7-8/10 with any attempts of making composite fist as well as wrist flexion extension decreased pain at volar and ulnar wrist as well as ulnar hand and  fifth digit Goal status: INITIAL   4.  Left hand and wrist improve on PRWHE by more than 30 points  Baseline: Pain on PRWHE at evaluation 42/50  goal status: INITIAL   4.  Patient function of using left hand on PRWHE improve with more than 15 points Baseline: PRWHE score for function at eval is 26/50  Goal status: INITIAL    ASSESSMENT:  CLINICAL IMPRESSION: Patient seen for left carpal tunnel revision on 08/26/2023 by Dr.Poggi -patient present at OT evaluation with pain 7-8/10 at the ulnar and volar wrist as well as ulnar hand and fifth digit.  Patient very protective and guarding of left hand use and attempts of range of motion.  Patient limited in wrist flexion extension as well as grip and prehension strength.  NOW patient with great improvement with pain decreasing from a 8 to a 4/10.  Increase motion in digits , thumb and wrist - with decrease pain -  increase grip and prehension strength.  Patient continues to have some discomfort and pain ulnar side of base of hand and wrist  sting at prox scar at end range extention -Ptresponded really good on session and show a decrease pain decrease scar tissue increase motion.  Patient limited in functional use of left hand in ADLs and IADLs.  Patient can benefit from skilled OT services to decrease scar tissue, decrease edema and pain and increase motion and strength to return to prior level of function.  PERFORMANCE DEFICITS: in functional skills including ADLs, IADLs, ROM, strength, pain, flexibility, decreased knowledge of use of DME, and UE functional use,   and psychosocial skills including environmental adaptation and routines and behaviors.   IMPAIRMENTS: are limiting patient from ADLs, IADLs, rest and sleep, play, leisure, and social participation.   COMORBIDITIES: has no other co-morbidities that affects occupational performance. Patient will benefit from skilled OT to address above impairments and improve overall  function.  MODIFICATION OR ASSISTANCE TO COMPLETE EVALUATION: No modification of tasks or assist necessary to complete an evaluation.  OT OCCUPATIONAL PROFILE AND HISTORY: Problem focused assessment: Including review of records relating to presenting problem.  CLINICAL DECISION MAKING: LOW - limited treatment options, no task modification necessary  REHAB POTENTIAL: Good for goals  EVALUATION COMPLEXITY: Low      PLAN:  OT FREQUENCY: 1-2x/week  OT DURATION: 8 weeks  PLANNED INTERVENTIONS: 97168 OT Re-evaluation, 97535 self care/ADL training, 02889 therapeutic exercise, 97530 therapeutic activity, 97140 manual therapy, 97035 ultrasound, 97018 paraffin, 02960 fluidotherapy, 97034 contrast bath, scar mobilization, passive range of motion, patient/family education, and DME and/or AE instructions    CONSULTED AND AGREED WITH PLAN OF CARE: Patient     Ancel Peters, OTR/L,CLT 12/31/2023, 4:38 PM

## 2024-01-04 ENCOUNTER — Ambulatory Visit: Admitting: Occupational Therapy

## 2024-01-05 ENCOUNTER — Encounter: Admitting: Occupational Therapy

## 2024-01-12 ENCOUNTER — Ambulatory Visit: Admitting: Occupational Therapy

## 2024-01-12 DIAGNOSIS — L905 Scar conditions and fibrosis of skin: Secondary | ICD-10-CM

## 2024-01-12 DIAGNOSIS — G5601 Carpal tunnel syndrome, right upper limb: Secondary | ICD-10-CM

## 2024-01-12 DIAGNOSIS — M25532 Pain in left wrist: Secondary | ICD-10-CM

## 2024-01-12 DIAGNOSIS — M79642 Pain in left hand: Secondary | ICD-10-CM

## 2024-01-12 DIAGNOSIS — M6281 Muscle weakness (generalized): Secondary | ICD-10-CM

## 2024-01-12 NOTE — Therapy (Signed)
 OUTPATIENT OCCUPATIONAL THERAPY ORTHO TREATMENT  Patient Name: Tonya Keith MRN: [id number] DOB:March 12, 1961, 63 y.o., female Today's Date: 01/12/2024  PCP: Dr Tonya Keith REFERRING PROVIDER: Dr Tonya Keith  END OF SESSION:  OT End of Session - 01/12/24 1450     Visit Number 5    Number of Visits 12    Date for OT Re-Evaluation 02/15/24    OT Start Time 1450    OT Stop Time 1530    OT Time Calculation (min) 40 min    Activity Tolerance Patient tolerated treatment well    Behavior During Therapy WFL for tasks assessed/performed          Past Medical History:  Diagnosis Date   Anemia    Anginal pain (HCC)    Aortic atherosclerosis (HCC)    Arthritis    Bilateral carpal tunnel syndrome    Bradycardia    Breast cancer, left (HCC) 2008   a.) s/p surgical resection (mastectomy) + systemic chemotherapy   Cardiac murmur    Cerebral tonsillar ectopia    COPD (chronic obstructive pulmonary disease) (HCC)    Coronary artery disease    a.) PCI 08/09/2007: 100% mRCA (2.25 x 23 mm Mini Vision BMS)   Depression    H. pylori infection    Hemoglobin C trait (HCC)    HLD (hyperlipidemia)    Hx of migraine headaches    Hypertension    Long-term use of aspirin  therapy    Marijuana use    Memory impairment    OSA (obstructive sleep apnea)    a.) does not require nocturnal PAP therapy   Osteopenia    a.) on oral bisphosphonate (alendronate)   Palpitations    Personal history of chemotherapy    Postural hypotension    Pulmonary granuloma (HCC)    Smoker    STEMI involving right coronary artery (HCC) 08/09/2007   a.) LHC/PCI 08/09/2007: 100% mRCA (2.5 x 23 mm Mini Vision BMS)   T2DM (type 2 diabetes mellitus) (HCC)    Trichomoniasis    Vitamin D  insufficiency    Wears dentures    full upper (currently unable to wear)   Past Surgical History:  Procedure Laterality Date   ABDOMINAL HYSTERECTOMY     ANKLE FRACTURE SURGERY Right    CARPAL TUNNEL RELEASE Left 01/27/2023   Procedure:  CARPAL TUNNEL RELEASE;  Surgeon: Tonya Sharper, MD;  Location: [facility];  Service: Orthopedics;  Laterality: Left;   CARPAL TUNNEL RELEASE Left 08/26/2023   Procedure: CARPAL TUNNEL RELEASE;  Surgeon: Tonya Keith Norleen PARAS, MD;  Location: [facility];  Service: Orthopedics;  Laterality: Left;   COLONOSCOPY N/A 11/30/2023   Procedure: COLONOSCOPY;  Surgeon: Tonya Ole DASEN, MD;  Location: [facility] ENDOSCOPY;  Service: Endoscopy;  Laterality: N/A;   CORONARY ANGIOPLASTY WITH STENT PLACEMENT Left 08/09/2007   Procedure: CORONARY ANGIOPLASTY WITH STENT PLACEMENT; Location: [facility]; Surgeon: Tonya Lovelace, MD   ESOPHAGOGASTRODUODENOSCOPY N/A 11/30/2023   Procedure: EGD (ESOPHAGOGASTRODUODENOSCOPY);  Surgeon: Tonya Ole DASEN, MD;  Location: [facility] ENDOSCOPY;  Service: Endoscopy;  Laterality: N/A;   LEFT HEART CATH AND CORONARY ANGIOGRAPHY Left 09/07/2019   Procedure: LEFT HEART CATH AND CORONARY ANGIOGRAPHY;  Surgeon: Keith Cara BIRCH, MD;  Location: [facility] INVASIVE CV [facility];  Service: Cardiovascular;  Laterality: Left;   LEFT HEART CATH AND CORONARY ANGIOGRAPHY Left 06/12/2010   Procedure: LEFT HEART CATH AND CORONARY ANGIOGRAPHY; Location: [facility]; Surgeon: Tonya Lovelace, MD   LESION EXCISION WITH COMPLEX REPAIR Right 12/04/2020   Procedure: Excision right upper lip intraoral  lesion;  Surgeon: Tonya Mt, MD;  Location: [facility];  Service: ENT;  Laterality: Right;  Latex   MASTECTOMY Left 2008   MINOR EXCISION OF ORAL LESION N/A 10/02/2020   Procedure: excision of right upper lip mass, intraoral;  Surgeon: Tonya Mt, MD;  Location: [facility];  Service: ENT;  Laterality: N/A;  Latex   WISDOM TOOTH EXTRACTION     Patient Active Problem List   Diagnosis Date Noted   Osteoarthritis of hips (Bilateral) 11/03/2022   Lumbar radiculitis (L5, S1) (Bilateral) (L>R) 11/03/2022   Lumbosacral radiculopathy/radiculitis at S1 (Bilateral) (L>R) 11/03/2022   Lumbosacral  radiculopathy/radiculitis at L5 (Bilateral) (L>R) 11/03/2022   Abnormal drug screen (09/22/2022) 09/25/2022   Chronic hip pain (Bilateral) (R>L) 09/22/2022   Decreased range of motion of hips (Bilateral) (R>L) 09/22/2022   Chronic low back pain (2ry area of Pain) (Bilateral) (R>L) w/o sciatica 09/22/2022   Decreased range of motion of lumbar spine 09/22/2022   Generalized osteoarthritis of multiple sites 09/22/2022   History of marijuana use 09/22/2022   Heart murmur 09/21/2022   Myocardial infarction (HCC) 09/21/2022   Chronic pain syndrome 09/21/2022   Pharmacologic therapy 09/21/2022   Disorder of skeletal system 09/21/2022   Problems influencing health status 09/21/2022   Memory impairment 08/15/2022   Type 2 diabetes mellitus (HCC) 02/04/2022   Other insomnia 08/28/2019   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   Chronic obstructive pulmonary disease (HCC) 03/06/2019   Coronary artery disease involving native coronary artery of native heart without angina pectoris 03/06/2019   Major depressive disorder, recurrent, moderate (HCC) 03/06/2019   Malignant neoplasm of left female breast (HCC) 03/06/2019   Osteopenia 03/06/2019   Vitamin D  insufficiency 03/06/2019   Hypothermia 12/03/2017   Chronic pain of lower extremity (1ry area of Pain) (Bilateral) 11/17/2017   Bilateral lower extremity edema 11/17/2017   Moderate tobacco use disorder 11/17/2017   Hypertension 11/17/2017   Hypercholesterolemia 11/17/2017    ONSET DATE: 08/26/23  REFERRING DIAG: L  CTR revision  THERAPY DIAG:  Carpal tunnel syndrome, right upper limb  Pain in left hand  Pain in left wrist  Scar condition and fibrosis of skin  Muscle weakness (generalized)  Rationale for Evaluation and Treatment: Rehabilitation  SUBJECTIVE:   SUBJECTIVE STATEMENT: My pain is better.  Look can make a fist and using my hand and wrist better.  I am still afraid for the pain to come back.  pt accompanied by:  self  PERTINENT HISTORY: Dr Tonya Keith appt 01/30/24 - refer to OT again - Tonya Keith [place] is a 63 y.o. female who presents for follow-up now 3 months status post a revision open left carpal tunnel release. The patient continues to note moderate to severe pain in her left wrist and hand with occasional radiation into the volar forearm region. She rates his pain as high as 8/10, and has been taking Tylenol  and soaking her hand in hot water with limited benefit. She did not attend formal occupational therapy as recommended at her last visit, stating that she called but they did not have any openings at the time. She has been trying to do some exercises on her own at home with limited benefit. She denies any reinjury to the wrist, and denies any fevers or chills.  Regional left carpal tunnel release for September 24  PRECAUTIONS: None     WEIGHT BEARING RESTRICTIONS: No  PAIN:  Are you having pain?  1/10 at rest  FALLS: Has patient fallen in last  6 months? No  LIVING ENVIRONMENT: Lives with: lives alone   PLOF: Independent in bathing and dressing as well as like cooking and laundry.  Some it takes her to get groceries.  Lives in the apartment.  1 step to get into  PATIENT GOALS: I want to get the pain better, my motion and strength so I can use it like my right hand -thank you help miliaris your  NEXT MD VISIT: ?  OBJECTIVE:  Note: Objective measures were completed at Evaluation unless otherwise noted.  HAND DOMINANCE: Right  ADLs: Unable to grip, squeeze, pick up, carry, push or pull with left hand  FUNCTIONAL OUTCOME MEASURES: PRWHE pain 42/50; function 26/50   UPPER EXTREMITY ROM:     Active ROM Right eval Left eval 12/25/23 L 8/5L/25  Shoulder flexion      Shoulder abduction      Shoulder adduction      Shoulder extension      Shoulder internal rotation      Shoulder external rotation      Elbow flexion      Elbow extension      Wrist flexion  68 80 100  Wrist extension  65 68  75  Wrist ulnar deviation  30    Wrist radial deviation  25    Wrist pronation  90    Wrist supination  90     (Blank rows = not tested) Pain with all wrist AROM - 7-8/10 pain ulnar wrist and hand into the forearm and hand at evaluation  Active ROM Right eval Left eval Left 12/25/23  Thumb MCP (0-60)     Thumb IP (0-80)     Thumb Radial abd/add (0-55)      Thumb Palmar abd/add (0-45)      Thumb Opposition to Small Finger      Index MCP (0-90)  80    Index PIP (0-100)  100    Index DIP (0-70)       Long MCP (0-90)   90    Long PIP (0-100)    100   Long DIP (0-70)       Ring MCP (0-90)    90   Ring PIP (0-100)   100    Ring DIP (0-70)       Little MCP (0-90)   90    Little PIP (0-100)   100    Little DIP (0-70)       (Blank rows = not tested)   HAND FUNCTION: Grip strength: Right: 56 lbs; Left: 19 lbs, Lateral pinch: Right: 14 lbs, Left: 8 lbs, and 3 point pinch: Right: 11 lbs, Left: 7 lbs Pain 7-8/10 on left hand with all grip and prehension 12/25/23 Grip strength: Right: 56 lbs; Left: 35 lbs, Lateral pinch: Right: 14 lbs, Left: 12 lbs, and 3 point pinch: Right: 11 lbs, Left: 8 lbs 01/12/24 Grip strength: Right: 56 lbs; Left: 45 lbs, Lateral pinch: Right: 14 lbs, Left: 12 lbs, and 3 point pinch: Right: 11 lbs, Left: 8 lbs  COORDINATION: Decreased because of increased pain and range of motion  SENSATION: Patient report numbness in 2nd, 3rd and 4th digits will assess Semmes-Weinstein  Now patient reports normal sensation denies numbness  EDEMA: Over the ulnar wrist  COGNITION: Overall cognitive status: Within functional limits for tasks assessed     TREATMENT DATE: 01/12/24          Patient continues to have full digit flexion pain-free opposition to all digits  Wrist flexion extension improved greatly. Patient denies any pain coming in. Grip strength increase greatly see flowsheet Noticed with assessment of prehension patient compensate.  Using lateral pinch and  3-point pinch functionally.   Reviewed with patient and added opening large [place] and smaller Ziploc bag as well as a flip top.  Patient compensates with shoulder abduction pronation and 3 point pinch. Had patient practiced a few times using her lateral pinch normally in opening Ziploc bags retrieving objects out of Ziploc bags.  Needed verbal cueing Also done some fine motor and dexterity for patient to do opposition thumb on fingers fingers on thumb 3 cm object with thumb to 2nd and 3rd back to palm 10 times, palm with thumb to 4th and 5th digits and back to palm 10 reps Practiced picking up 4 small objects of 1 cm 1 at a time placed in palm and retrieved out of palm 1 at a time.  Left hand much slower.  But improved greatly after practicing with the right cupping of the left and after paraffin. Both add to home exercises                                                                                                       Modalities: Paraffin Time: 8 Location: Left hand and wrist To decrease pain and stiffness   Patient to continue wearing Benik neoprene wrap as needed Patient to continue with  scar massage to volar scar at the wrist as well as fitted with a Cica -Care scar pad for nighttime at eval.  With a Tubigrip D for light compression. Scar mobilization done by OT manually and using mini massager   Pt maintain progress in wrist ext and flexion from last visit  Also done metacarpal spreads and Carpal spreads done  Done Graston tool #6 for sweeping mostly over 4th and 5th volar digits ulnar side of the hand.  Done soft tissue facilitation with palm on medium putty rolling on all directions and palm and arches with light pressure in all directions 5 reps Tolerating well Patient is using red foam roller for soft tissue massage.     Patient to continue with tendon glides pain-free focusing on motion more than forcing 12 reps Opposition to all digits 8 reps Above 2 exercises to  be done prior.  Fine motor and dexterity exercise. Patient can also do individual tendon glides as well as working on different signs with hide hand  Cont prayer stretch for composite wrist extension 10 reps pain-free Followed by table slides 20 reps added to home exercises pain-free to 3 times a day  At this stage I pound weight for wrist and forearm in all planes.  Wrist flexion extension and radial ulnar deviation with supination pronation 12 reps 2 times a day   Continue with medial nerve glide 5 reps 2 times a day Patient needed some verbal cueing not to force rotation and supination irritating ulnar side of the wrist.         PATIENT EDUCATION: Education details: findings of eval and HEP  Person  educated: Patient Education method: Explanation, Demonstration, Tactile cues, Verbal cues, and Handouts Education comprehension: verbalized understanding, returned demonstration, verbal cues required, and needs further education     GOALS: Goals reviewed with patient? Yes  LONG TERM GOALS: Target date: 8 wks   Pain in left hand decreased to less than 2/10 with composite fisting as well as using left hand in bathing dressing Baseline: Pain 7-8/10 at volar wrist ulnar hand and fifth digit.  With any attempts of flexion. Goal status: INITIAL  2.  Left wrist and forearm AROM improve within normal limits symptom-free to use in bathing and dressing Baseline: AROM at the wrist left 65 extension 68 flexion forearm or radial ulnar deviation within normal limits but pain 7-8/10 at wrist ulnarly and hand and fifth digit Goal status: INITIAL  3.  Left grip and prehension strength improved within normal range age for patient symptom-free to be able to squeeze a washcloth pick up a drink pull up pants perform hygiene Baseline:  Grip strength: Right: 56 lbs; Left: 19 lbs, Lateral pinch: Right: 14 lbs, Left: 8 lbs, and 3 point pinch: Right: 11 lbs, Left: 7 lbs Goal status: INITIAL  4.  Left  hand and wrist improved for patient to be able to push and pull door, turn a doorknob cut food open container symptom-free Baseline: Pain 7-8/10 with any attempts of making composite fist as well as wrist flexion extension decreased pain at volar and ulnar wrist as well as ulnar hand and fifth digit Goal status: INITIAL   4.  Left hand and wrist improve on PRWHE by more than 30 points  Baseline: Pain on PRWHE at evaluation 42/50  goal status: INITIAL   4.  Patient function of using left hand on PRWHE improve with more than 15 points Baseline: PRWHE score for function at eval is 26/50  Goal status: INITIAL    ASSESSMENT:  CLINICAL IMPRESSION: Patient seen for left carpal tunnel revision on 08/26/2023 by Dr.Poggi -patient present at OT evaluation with pain 7-8/10 at the ulnar and volar wrist as well as ulnar hand and fifth digit.  Patient very protective and guarding of left hand use and attempts of range of motion.  Patient limited in wrist flexion extension as well as grip and prehension strength.  NOW patient with great improvement with pain decreasing from a 8/10 and coming in today pain-free.  Pain in clinic today was 1/10 at the wrist.  Was able to initiate strengthening for wrist and forearm and added to home program.  Grip increased greatly see flowsheet.  Did assess patient's prehension and fine motor and functional task.  Reviewed with patient and added as home exercises.Pt responded really good on session and show a decrease pain decrease scar tissue increase motion.  Patient limited in functional use of left hand in ADLs and IADLs.  Patient can benefit from skilled OT services to decrease scar tissue, decrease edema and pain and increase motion and strength to return to prior level of function.  PERFORMANCE DEFICITS: in functional skills including ADLs, IADLs, ROM, strength, pain, flexibility, decreased knowledge of use of DME, and UE functional use,   and psychosocial skills including  environmental adaptation and routines and behaviors.   IMPAIRMENTS: are limiting patient from ADLs, IADLs, rest and sleep, play, leisure, and social participation.   COMORBIDITIES: has no other co-morbidities that affects occupational performance. Patient will benefit from skilled OT to address above impairments and improve overall function.  MODIFICATION OR ASSISTANCE TO COMPLETE EVALUATION: No  modification of tasks or assist necessary to complete an evaluation.  OT OCCUPATIONAL PROFILE AND HISTORY: Problem focused assessment: Including review of records relating to presenting problem.  CLINICAL DECISION MAKING: LOW - limited treatment options, no task modification necessary  REHAB POTENTIAL: Good for goals  EVALUATION COMPLEXITY: Low      PLAN:  OT FREQUENCY: 1-2x/week  OT DURATION: 8 weeks  PLANNED INTERVENTIONS: 97168 OT Re-evaluation, 97535 self care/ADL training, 02889 therapeutic exercise, 97530 therapeutic activity, 97140 manual therapy, 97035 ultrasound, 97018 paraffin, 02960 fluidotherapy, 97034 contrast bath, scar mobilization, passive range of motion, patient/family education, and DME and/or AE instructions    CONSULTED AND AGREED WITH PLAN OF CARE: Patient     Ancel Peters, OTR/L,CLT 01/12/2024, 5:27 PM

## 2024-01-14 ENCOUNTER — Ambulatory Visit: Admitting: Occupational Therapy

## 2024-01-14 DIAGNOSIS — G5601 Carpal tunnel syndrome, right upper limb: Secondary | ICD-10-CM | POA: Diagnosis not present

## 2024-01-14 DIAGNOSIS — M79642 Pain in left hand: Secondary | ICD-10-CM

## 2024-01-14 DIAGNOSIS — M6281 Muscle weakness (generalized): Secondary | ICD-10-CM

## 2024-01-14 DIAGNOSIS — L905 Scar conditions and fibrosis of skin: Secondary | ICD-10-CM

## 2024-01-14 DIAGNOSIS — M25532 Pain in left wrist: Secondary | ICD-10-CM

## 2024-01-14 NOTE — Therapy (Signed)
 OUTPATIENT OCCUPATIONAL THERAPY ORTHO TREATMENT  Patient Name: Tonya Keith MRN: [id number] DOB:1961-03-09, 63 y.o., female Today's Date: 01/14/2024  PCP: Dr Idelia REFERRING PROVIDER: Dr Edie  END OF SESSION:  OT End of Session - 01/14/24 1319     Visit Number 6    Number of Visits 12    Date for OT Re-Evaluation 02/15/24    OT Start Time 1310    OT Stop Time 1355    OT Time Calculation (min) 45 min    Activity Tolerance Patient tolerated treatment well    Behavior During Therapy WFL for tasks assessed/performed          Past Medical History:  Diagnosis Date   Anemia    Anginal pain (HCC)    Aortic atherosclerosis (HCC)    Arthritis    Bilateral carpal tunnel syndrome    Bradycardia    Breast cancer, left (HCC) 2008   a.) s/p surgical resection (mastectomy) + systemic chemotherapy   Cardiac murmur    Cerebral tonsillar ectopia    COPD (chronic obstructive pulmonary disease) (HCC)    Coronary artery disease    a.) PCI 08/09/2007: 100% mRCA (2.25 x 23 mm Mini Vision BMS)   Depression    H. pylori infection    Hemoglobin C trait (HCC)    HLD (hyperlipidemia)    Hx of migraine headaches    Hypertension    Long-term use of aspirin  therapy    Marijuana use    Memory impairment    OSA (obstructive sleep apnea)    a.) does not require nocturnal PAP therapy   Osteopenia    a.) on oral bisphosphonate (alendronate)   Palpitations    Personal history of chemotherapy    Postural hypotension    Pulmonary granuloma (HCC)    Smoker    STEMI involving right coronary artery (HCC) 08/09/2007   a.) LHC/PCI 08/09/2007: 100% mRCA (2.5 x 23 mm Mini Vision BMS)   T2DM (type 2 diabetes mellitus) (HCC)    Trichomoniasis    Vitamin D  insufficiency    Wears dentures    full upper (currently unable to wear)   Past Surgical History:  Procedure Laterality Date   ABDOMINAL HYSTERECTOMY     ANKLE FRACTURE SURGERY Right    CARPAL TUNNEL RELEASE Left 01/27/2023   Procedure:  CARPAL TUNNEL RELEASE;  Surgeon: Kathlynn Sharper, MD;  Location: [facility];  Service: Orthopedics;  Laterality: Left;   CARPAL TUNNEL RELEASE Left 08/26/2023   Procedure: CARPAL TUNNEL RELEASE;  Surgeon: Edie Norleen PARAS, MD;  Location: [facility];  Service: Orthopedics;  Laterality: Left;   COLONOSCOPY N/A 11/30/2023   Procedure: COLONOSCOPY;  Surgeon: Maryruth Ole DASEN, MD;  Location: [facility];  Service: Endoscopy;  Laterality: N/A;   CORONARY ANGIOPLASTY WITH STENT PLACEMENT Left 08/09/2007   Procedure: CORONARY ANGIOPLASTY WITH STENT PLACEMENT; Location: [facility]; Surgeon: Margie Lovelace, MD   ESOPHAGOGASTRODUODENOSCOPY N/A 11/30/2023   Procedure: EGD (ESOPHAGOGASTRODUODENOSCOPY);  Surgeon: Maryruth Ole DASEN, MD;  Location: [facility];  Service: Endoscopy;  Laterality: N/A;   LEFT HEART CATH AND CORONARY ANGIOGRAPHY Left 09/07/2019   Procedure: LEFT HEART CATH AND CORONARY ANGIOGRAPHY;  Surgeon: Lovelace Cara BIRCH, MD;  Location: [facility];  Service: Cardiovascular;  Laterality: Left;   LEFT HEART CATH AND CORONARY ANGIOGRAPHY Left 06/12/2010   Procedure: LEFT HEART CATH AND CORONARY ANGIOGRAPHY; Location: [facility]; Surgeon: Margie Lovelace, MD   LESION EXCISION WITH COMPLEX REPAIR Right 12/04/2020   Procedure: Excision right upper lip intraoral  lesion;  Surgeon: Blair Mt, MD;  Location: [facility];  Service: ENT;  Laterality: Right;  Latex   MASTECTOMY Left 2008   MINOR EXCISION OF ORAL LESION N/A 10/02/2020   Procedure: excision of right upper lip mass, intraoral;  Surgeon: Blair Mt, MD;  Location: [facility];  Service: ENT;  Laterality: N/A;  Latex   WISDOM TOOTH EXTRACTION     Patient Active Problem List   Diagnosis Date Noted   Osteoarthritis of hips (Bilateral) 11/03/2022   Lumbar radiculitis (L5, S1) (Bilateral) (L>R) 11/03/2022   Lumbosacral radiculopathy/radiculitis at S1 (Bilateral) (L>R) 11/03/2022   Lumbosacral  radiculopathy/radiculitis at L5 (Bilateral) (L>R) 11/03/2022   Abnormal drug screen (09/22/2022) 09/25/2022   Chronic hip pain (Bilateral) (R>L) 09/22/2022   Decreased range of motion of hips (Bilateral) (R>L) 09/22/2022   Chronic low back pain (2ry area of Pain) (Bilateral) (R>L) w/o sciatica 09/22/2022   Decreased range of motion of lumbar spine 09/22/2022   Generalized osteoarthritis of multiple sites 09/22/2022   History of marijuana use 09/22/2022   Heart murmur 09/21/2022   Myocardial infarction (HCC) 09/21/2022   Chronic pain syndrome 09/21/2022   Pharmacologic therapy 09/21/2022   Disorder of skeletal system 09/21/2022   Problems influencing health status 09/21/2022   Memory impairment 08/15/2022   Type 2 diabetes mellitus (HCC) 02/04/2022   Other insomnia 08/28/2019   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   Chronic obstructive pulmonary disease (HCC) 03/06/2019   Coronary artery disease involving native coronary artery of native heart without angina pectoris 03/06/2019   Major depressive disorder, recurrent, moderate (HCC) 03/06/2019   Malignant neoplasm of left female breast (HCC) 03/06/2019   Osteopenia 03/06/2019   Vitamin D  insufficiency 03/06/2019   Hypothermia 12/03/2017   Chronic pain of lower extremity (1ry area of Pain) (Bilateral) 11/17/2017   Bilateral lower extremity edema 11/17/2017   Moderate tobacco use disorder 11/17/2017   Hypertension 11/17/2017   Hypercholesterolemia 11/17/2017    ONSET DATE: 08/26/23  REFERRING DIAG: L  CTR revision  THERAPY DIAG:  Carpal tunnel syndrome, right upper limb  Pain in left hand  Pain in left wrist  Scar condition and fibrosis of skin  Muscle weakness (generalized)  Rationale for Evaluation and Treatment: Rehabilitation  SUBJECTIVE:   SUBJECTIVE STATEMENT: Still funny feeling side of index finger and side of wrist below pinkie little sore today   pt accompanied by: self  PERTINENT HISTORY: Dr Edie appt  01/30/24 - refer to OT again - Tonya Keith is a 63 y.o. female who presents for follow-up now 3 months status post a revision open left carpal tunnel release. The patient continues to note moderate to severe pain in her left wrist and hand with occasional radiation into the volar forearm region. She rates his pain as high as 8/10, and has been taking Tylenol  and soaking her hand in hot water with limited benefit. She did not attend formal occupational therapy as recommended at her last visit, stating that she called but they did not have any openings at the time. She has been trying to do some exercises on her own at home with limited benefit. She denies any reinjury to the wrist, and denies any fevers or chills.  Regional left carpal tunnel release for September 24  PRECAUTIONS: None     WEIGHT BEARING RESTRICTIONS: No  PAIN:  Are you having pain? 3/10 ulnar wrist with 1 lbs weight   FALLS: Has patient fallen in last 6 months? No  LIVING ENVIRONMENT: Lives  with: lives alone   PLOF: Independent in bathing and dressing as well as like cooking and laundry.  Some it takes her to get groceries.  Lives in the apartment.  1 step to get into  PATIENT GOALS: I want to get the pain better, my motion and strength so I can use it like my right hand -thank you help miliaris your  NEXT MD VISIT: ?  OBJECTIVE:  Note: Objective measures were completed at Evaluation unless otherwise noted.  HAND DOMINANCE: Right  ADLs: Unable to grip, squeeze, pick up, carry, push or pull with left hand  FUNCTIONAL OUTCOME MEASURES: PRWHE pain 42/50; function 26/50   UPPER EXTREMITY ROM:     Active ROM Right eval Left eval 12/25/23 L 8/5L/25  Shoulder flexion      Shoulder abduction      Shoulder adduction      Shoulder extension      Shoulder internal rotation      Shoulder external rotation      Elbow flexion      Elbow extension      Wrist flexion  68 80 100  Wrist extension  65 68 75  Wrist ulnar  deviation  30    Wrist radial deviation  25    Wrist pronation  90    Wrist supination  90     (Blank rows = not tested) Pain with all wrist AROM - 7-8/10 pain ulnar wrist and hand into the forearm and hand at evaluation  Active ROM Right eval Left eval Left 12/25/23  Thumb MCP (0-60)     Thumb IP (0-80)     Thumb Radial abd/add (0-55)      Thumb Palmar abd/add (0-45)      Thumb Opposition to Small Finger      Index MCP (0-90)  80    Index PIP (0-100)  100    Index DIP (0-70)       Long MCP (0-90)   90    Long PIP (0-100)    100   Long DIP (0-70)       Ring MCP (0-90)    90   Ring PIP (0-100)   100    Ring DIP (0-70)       Little MCP (0-90)   90    Little PIP (0-100)   100    Little DIP (0-70)       (Blank rows = not tested)   HAND FUNCTION: Grip strength: Right: 56 lbs; Left: 19 lbs, Lateral pinch: Right: 14 lbs, Left: 8 lbs, and 3 point pinch: Right: 11 lbs, Left: 7 lbs Pain 7-8/10 on left hand with all grip and prehension 12/25/23 Grip strength: Right: 56 lbs; Left: 35 lbs, Lateral pinch: Right: 14 lbs, Left: 12 lbs, and 3 point pinch: Right: 11 lbs, Left: 8 lbs 01/12/24 Grip strength: Right: 56 lbs; Left: 45 lbs, Lateral pinch: Right: 14 lbs, Left: 12 lbs, and 3 point pinch: Right: 11 lbs, Left: 8 lbs  COORDINATION: Decreased because of increased pain and range of motion  SENSATION: Patient report numbness in 2nd, 3rd and 4th digits will assess Semmes-Weinstein  Now patient reports normal sensation denies numbness  EDEMA: Over the ulnar wrist  COGNITION: Overall cognitive status: Within functional limits for tasks assessed     TREATMENT DATE: 01/14/24          Review with pt holding phone- pt using thumb to cup as well as 5th digit flexion and wrist flexion  with UD - causing some of increase symptoms still  Review with pt holding her phone- get ring or knob - or prop up - pt do use her phone a lot Pt also wearing Benik neoprene wrist wrap still - more than 8  hrs- ? To tight around radial wrist  Pt to wean out of Benik -only on for groceries , cleaning, laundry and making bed    Cont to practice picking up 4 small objects of 1 cm 1 at a time placed in palm and retrieved out of palm 1 at a time.  Left hand much slower.  Cont for home                                                                                                        Modalities: Fluidotherapy  Time: 8 Location: Left hand and wrist To decrease pain and stiffness   Patient to continue with  scar massage to volar scar at the wrist as well as fitted with a Cica -Care scar pad for nighttime .  With a Tubigrip D for light compression. Scar mobilization done by OT manually and using mini massager  Also done metacarpal spreads and Carpal spreads done  Done Graston tool #6 brushing over volar hand and scar - great success  Done soft tissue facilitation with palm on soft blue putty rolling on all directions and palm and arches with light pressure in all directions 5 reps -add to HEP- Gripping add 20 reps 2x day light blue putty  Tolerating well    Patient to continue with tendon glides pain-free focusing on motion more than forcing 12 reps Opposition to all digits 8 reps Above 2 exercises to be done prior.  Fine motor and dexterity exercise. Patient can also do individual tendon glides as well as working on different signs with hide hand  Cont prayer stretch for composite wrist extension 10 reps pain-free Followed by table slides 20 reps added to home exercises pain-free to 3 times a day And wall pushups  16 oz hammer or 1 pound weight for wrist and forearm  .  Wrist flexion extension and supination pronation 2 sets of  12 reps 2 times a day   Continue with medial nerve glide 5 reps 2 times a day Patient needed some verbal cueing not to force rotation and supination irritating ulnar side of the wrist.         PATIENT EDUCATION: Education details: findings of eval and  HEP  Person educated: Patient Education method: Explanation, Demonstration, Tactile cues, Verbal cues, and Handouts Education comprehension: verbalized understanding, returned demonstration, verbal cues required, and needs further education     GOALS: Goals reviewed with patient? Yes  LONG TERM GOALS: Target date: 8 wks   Pain in left hand decreased to less than 2/10 with composite fisting as well as using left hand in bathing dressing Baseline: Pain 7-8/10 at volar wrist ulnar hand and fifth digit.  With any attempts of flexion. Goal status: INITIAL  2.  Left wrist and forearm AROM improve within normal limits  symptom-free to use in bathing and dressing Baseline: AROM at the wrist left 65 extension 68 flexion forearm or radial ulnar deviation within normal limits but pain 7-8/10 at wrist ulnarly and hand and fifth digit Goal status: INITIAL  3.  Left grip and prehension strength improved within normal range age for patient symptom-free to be able to squeeze a washcloth pick up a drink pull up pants perform hygiene Baseline:  Grip strength: Right: 56 lbs; Left: 19 lbs, Lateral pinch: Right: 14 lbs, Left: 8 lbs, and 3 point pinch: Right: 11 lbs, Left: 7 lbs Goal status: INITIAL  4.  Left hand and wrist improved for patient to be able to push and pull door, turn a doorknob cut food open container symptom-free Baseline: Pain 7-8/10 with any attempts of making composite fist as well as wrist flexion extension decreased pain at volar and ulnar wrist as well as ulnar hand and fifth digit Goal status: INITIAL   4.  Left hand and wrist improve on PRWHE by more than 30 points  Baseline: Pain on PRWHE at evaluation 42/50  goal status: INITIAL   4.  Patient function of using left hand on PRWHE improve with more than 15 points Baseline: PRWHE score for function at eval is 26/50  Goal status: INITIAL    ASSESSMENT:  CLINICAL IMPRESSION: Patient seen for left carpal tunnel revision on  08/26/2023 by Dr.Poggi -patient present at OT evaluation with pain 7-8/10 at the ulnar and volar wrist as well as ulnar hand and fifth digit.  Patient very protective and guarding of left hand use and attempts of range of motion.  Patient limited in wrist flexion extension as well as grip and prehension strength.  NOW patient with great improvement with pain decreasing from a 8/10 and coming in today with 2-3/10 pain ulnar wrist and funny feeling radial hand at 2nd [facility]- pt holding phone long hours day - review propping up phone or get ring or button - also to wean out of Benik neoprene only for cleaning, groceries shopping , laundry - ? To tight around radial wrist Add and  cont 1 lbs for wrist except RD and UD -and putty for gripping add - pt  responded really good on session and show a decrease pain decrease scar tissue increase motion.  Patient limited in functional use of left hand in ADLs and IADLs.  Patient can benefit from skilled OT services to decrease scar tissue, decrease edema and pain and increase motion and strength to return to prior level of function.  PERFORMANCE DEFICITS: in functional skills including ADLs, IADLs, ROM, strength, pain, flexibility, decreased knowledge of use of DME, and UE functional use,   and psychosocial skills including environmental adaptation and routines and behaviors.   IMPAIRMENTS: are limiting patient from ADLs, IADLs, rest and sleep, play, leisure, and social participation.   COMORBIDITIES: has no other co-morbidities that affects occupational performance. Patient will benefit from skilled OT to address above impairments and improve overall function.  MODIFICATION OR ASSISTANCE TO COMPLETE EVALUATION: No modification of tasks or assist necessary to complete an evaluation.  OT OCCUPATIONAL PROFILE AND HISTORY: Problem focused assessment: Including review of records relating to presenting problem.  CLINICAL DECISION MAKING: LOW - limited treatment options, no  task modification necessary  REHAB POTENTIAL: Good for goals  EVALUATION COMPLEXITY: Low      PLAN:  OT FREQUENCY: 1-2x/week  OT DURATION: 8 weeks  PLANNED INTERVENTIONS: 97168 OT Re-evaluation, 97535 self care/ADL training, 02889 therapeutic exercise, 97530  therapeutic activity, 97140 manual therapy, 97035 ultrasound, 02981 paraffin, 97039 fluidotherapy, 97034 contrast bath, scar mobilization, passive range of motion, patient/family education, and DME and/or AE instructions    CONSULTED AND AGREED WITH PLAN OF CARE: Patient     Ancel Peters, OTR/L,CLT 01/14/2024, 2:01 PM

## 2024-01-19 ENCOUNTER — Ambulatory Visit: Admitting: Occupational Therapy

## 2024-01-19 DIAGNOSIS — M25532 Pain in left wrist: Secondary | ICD-10-CM

## 2024-01-19 DIAGNOSIS — M25642 Stiffness of left hand, not elsewhere classified: Secondary | ICD-10-CM

## 2024-01-19 DIAGNOSIS — L905 Scar conditions and fibrosis of skin: Secondary | ICD-10-CM

## 2024-01-19 DIAGNOSIS — G5601 Carpal tunnel syndrome, right upper limb: Secondary | ICD-10-CM

## 2024-01-19 DIAGNOSIS — M79642 Pain in left hand: Secondary | ICD-10-CM

## 2024-01-19 DIAGNOSIS — M6281 Muscle weakness (generalized): Secondary | ICD-10-CM

## 2024-01-19 NOTE — Therapy (Signed)
 OUTPATIENT OCCUPATIONAL THERAPY ORTHO TREATMENT  Patient Name: Tonya Keith MRN: [id number] DOB:02/04/1961, 63 y.o., female Today's Date: 01/19/2024  PCP: Dr Idelia REFERRING PROVIDER: Dr Edie  END OF SESSION:  OT End of Session - 01/19/24 1413     Visit Number 7    Number of Visits 12    Date for OT Re-Evaluation 02/15/24    OT Start Time 1407    Activity Tolerance Patient tolerated treatment well    Behavior During Therapy [facility] for tasks assessed/performed          Past Medical History:  Diagnosis Date   Anemia    Anginal pain (HCC)    Aortic atherosclerosis (HCC)    Arthritis    Bilateral carpal tunnel syndrome    Bradycardia    Breast cancer, left (HCC) 2008   a.) s/p surgical resection (mastectomy) + systemic chemotherapy   Cardiac murmur    Cerebral tonsillar ectopia    COPD (chronic obstructive pulmonary disease) (HCC)    Coronary artery disease    a.) PCI 08/09/2007: 100% mRCA (2.25 x 23 mm Mini Vision BMS)   Depression    H. pylori infection    Hemoglobin C trait (HCC)    HLD (hyperlipidemia)    Hx of migraine headaches    Hypertension    Long-term use of aspirin  therapy    Marijuana use    Memory impairment    OSA (obstructive sleep apnea)    a.) does not require nocturnal PAP therapy   Osteopenia    a.) on oral bisphosphonate (alendronate)   Palpitations    Personal history of chemotherapy    Postural hypotension    Pulmonary granuloma (HCC)    Smoker    STEMI involving right coronary artery (HCC) 08/09/2007   a.) LHC/PCI 08/09/2007: 100% mRCA (2.5 x 23 mm Mini Vision BMS)   T2DM (type 2 diabetes mellitus) (HCC)    Trichomoniasis    Vitamin D  insufficiency    Wears dentures    full upper (currently unable to wear)   Past Surgical History:  Procedure Laterality Date   ABDOMINAL HYSTERECTOMY     ANKLE FRACTURE SURGERY Right    CARPAL TUNNEL RELEASE Left 01/27/2023   Procedure: CARPAL TUNNEL RELEASE;  Surgeon: Kathlynn Sharper, MD;  Location:  [facility];  Service: Orthopedics;  Laterality: Left;   CARPAL TUNNEL RELEASE Left 08/26/2023   Procedure: CARPAL TUNNEL RELEASE;  Surgeon: Edie Norleen PARAS, MD;  Location: [facility] ORS;  Service: Orthopedics;  Laterality: Left;   COLONOSCOPY N/A 11/30/2023   Procedure: COLONOSCOPY;  Surgeon: Maryruth Ole DASEN, MD;  Location: [facility] ENDOSCOPY;  Service: Endoscopy;  Laterality: N/A;   CORONARY ANGIOPLASTY WITH STENT PLACEMENT Left 08/09/2007   Procedure: CORONARY ANGIOPLASTY WITH STENT PLACEMENT; Location: [facility]; Surgeon: Margie Lovelace, MD   ESOPHAGOGASTRODUODENOSCOPY N/A 11/30/2023   Procedure: EGD (ESOPHAGOGASTRODUODENOSCOPY);  Surgeon: Maryruth Ole DASEN, MD;  Location: [facility] ENDOSCOPY;  Service: Endoscopy;  Laterality: N/A;   LEFT HEART CATH AND CORONARY ANGIOGRAPHY Left 09/07/2019   Procedure: LEFT HEART CATH AND CORONARY ANGIOGRAPHY;  Surgeon: Lovelace Cara BIRCH, MD;  Location: [facility] INVASIVE CV LAB;  Service: Cardiovascular;  Laterality: Left;   LEFT HEART CATH AND CORONARY ANGIOGRAPHY Left 06/12/2010   Procedure: LEFT HEART CATH AND CORONARY ANGIOGRAPHY; Location: [facility]; Surgeon: Margie Lovelace, MD   LESION EXCISION WITH COMPLEX REPAIR Right 12/04/2020   Procedure: Excision right upper lip intraoral lesion;  Surgeon: Blair Mt, MD;  Location: [facility];  Service: ENT;  Laterality:  Right;  Latex   MASTECTOMY Left 2008   MINOR EXCISION OF ORAL LESION N/A 10/02/2020   Procedure: excision of right upper lip mass, intraoral;  Surgeon: Blair Mt, MD;  Location: [facility];  Service: ENT;  Laterality: N/A;  Latex   WISDOM TOOTH EXTRACTION     Patient Active Problem List   Diagnosis Date Noted   Osteoarthritis of hips (Bilateral) 11/03/2022   Lumbar radiculitis (L5, S1) (Bilateral) (L>R) 11/03/2022   Lumbosacral radiculopathy/radiculitis at S1 (Bilateral) (L>R) 11/03/2022   Lumbosacral radiculopathy/radiculitis at L5 (Bilateral) (L>R) 11/03/2022   Abnormal drug screen  (09/22/2022) 09/25/2022   Chronic hip pain (Bilateral) (R>L) 09/22/2022   Decreased range of motion of hips (Bilateral) (R>L) 09/22/2022   Chronic low back pain (2ry area of Pain) (Bilateral) (R>L) w/o sciatica 09/22/2022   Decreased range of motion of lumbar spine 09/22/2022   Generalized osteoarthritis of multiple sites 09/22/2022   History of marijuana use 09/22/2022   Heart murmur 09/21/2022   Myocardial infarction (HCC) 09/21/2022   Chronic pain syndrome 09/21/2022   Pharmacologic therapy 09/21/2022   Disorder of skeletal system 09/21/2022   Problems influencing health status 09/21/2022   Memory impairment 08/15/2022   Type 2 diabetes mellitus (HCC) 02/04/2022   Other insomnia 08/28/2019   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   Chronic obstructive pulmonary disease (HCC) 03/06/2019   Coronary artery disease involving native coronary artery of native heart without angina pectoris 03/06/2019   Major depressive disorder, recurrent, moderate (HCC) 03/06/2019   Malignant neoplasm of left female breast (HCC) 03/06/2019   Osteopenia 03/06/2019   Vitamin D  insufficiency 03/06/2019   Hypothermia 12/03/2017   Chronic pain of lower extremity (1ry area of Pain) (Bilateral) 11/17/2017   Bilateral lower extremity edema 11/17/2017   Moderate tobacco use disorder 11/17/2017   Hypertension 11/17/2017   Hypercholesterolemia 11/17/2017    ONSET DATE: 08/26/23  REFERRING DIAG: L  CTR revision  THERAPY DIAG:  Carpal tunnel syndrome, right upper limb  Pain in left hand  Pain in left wrist  Scar condition and fibrosis of skin  Muscle weakness (generalized)  Stiffness of left hand, not elsewhere classified  Rationale for Evaluation and Treatment: Rehabilitation  SUBJECTIVE:   SUBJECTIVE STATEMENT: I am better- it was the way I held the phone- also I did the hot water this weekend and worked on the scar - feel it is softer   pt accompanied by: self  PERTINENT HISTORY: Dr Edie appt  01/30/24 - refer to OT again - Tonya Keith is a 63 y.o. female who presents for follow-up now 3 months status post a revision open left carpal tunnel release. The patient continues to note moderate to severe pain in her left wrist and hand with occasional radiation into the volar forearm region. She rates his pain as high as 8/10, and has been taking Tylenol  and soaking her hand in hot water with limited benefit. She did not attend formal occupational therapy as recommended at her last visit, stating that she called but they did not have any openings at the time. She has been trying to do some exercises on her own at home with limited benefit. She denies any reinjury to the wrist, and denies any fevers or chills.  Regional left carpal tunnel release for September 24  PRECAUTIONS: None     WEIGHT BEARING RESTRICTIONS: No  PAIN:  Are you having pain? No pain   FALLS: Has patient fallen in last 6 months? No  LIVING ENVIRONMENT: Lives  with: lives alone   PLOF: Independent in bathing and dressing as well as like cooking and laundry.  Some it takes her to get groceries.  Lives in the apartment.  1 step to get into  PATIENT GOALS: I want to get the pain better, my motion and strength so I can use it like my right hand -thank you help miliaris your  NEXT MD VISIT: ?  OBJECTIVE:  Note: Objective measures were completed at Evaluation unless otherwise noted.  HAND DOMINANCE: Right  ADLs: Unable to grip, squeeze, pick up, carry, push or pull with left hand  FUNCTIONAL OUTCOME MEASURES: PRWHE pain 42/50; function 26/50   UPPER EXTREMITY ROM:     Active ROM Right eval Left eval 12/25/23 L 8/5L/25  Shoulder flexion      Shoulder abduction      Shoulder adduction      Shoulder extension      Shoulder internal rotation      Shoulder external rotation      Elbow flexion      Elbow extension      Wrist flexion  68 80 100  Wrist extension  65 68 75  Wrist ulnar deviation  30    Wrist radial  deviation  25    Wrist pronation  90    Wrist supination  90     (Blank rows = not tested) Pain with all wrist AROM - 7-8/10 pain ulnar wrist and hand into the forearm and hand at evaluation  Active ROM Right eval Left eval Left 12/25/23  Thumb MCP (0-60)     Thumb IP (0-80)     Thumb Radial abd/add (0-55)      Thumb Palmar abd/add (0-45)      Thumb Opposition to Small Finger      Index MCP (0-90)  80    Index PIP (0-100)  100    Index DIP (0-70)       Long MCP (0-90)   90    Long PIP (0-100)    100   Long DIP (0-70)       Ring MCP (0-90)    90   Ring PIP (0-100)   100    Ring DIP (0-70)       Little MCP (0-90)   90    Little PIP (0-100)   100    Little DIP (0-70)       (Blank rows = not tested)   HAND FUNCTION: Grip strength: Right: 56 lbs; Left: 19 lbs, Lateral pinch: Right: 14 lbs, Left: 8 lbs, and 3 point pinch: Right: 11 lbs, Left: 7 lbs Pain 7-8/10 on left hand with all grip and prehension 12/25/23 Grip strength: Right: 56 lbs; Left: 35 lbs, Lateral pinch: Right: 14 lbs, Left: 12 lbs, and 3 point pinch: Right: 11 lbs, Left: 8 lbs 01/12/24 Grip strength: Right: 56 lbs; Left: 45 lbs, Lateral pinch: Right: 14 lbs, Left: 12 lbs, and 3 point pinch: Right: 11 lbs, Left: 8 lbs 01/19/24 Grip strength: Right: 56 lbs; Left: 46 lbs, Lateral pinch: Right: 14 lbs, Left: 13 lbs, and 3 point pinch: Right: 11 lbs, Left: 11 lbs COORDINATION: Decreased because of increased pain and range of motion  SENSATION: Patient report numbness in 2nd, 3rd and 4th digits will assess Semmes-Weinstein  Now patient reports normal sensation denies numbness  EDEMA: Over the ulnar wrist  COGNITION: Overall cognitive status: Within functional limits for tasks assessed     TREATMENT DATE: 01/19/24  R patient arrived today with less pain at the wrist as well as the ulnar hand and fifth digit.  Patient made some changes the way she held her phone.  Playing games and watching movies. Patient was  able to push and pull heavy door with no discomfort.  Was able to carry and lift 4 pounds with no pain and discomfort but heavier than 4 pounds had some minimal discomfort at ulnar wrist.  Cont to practice picking up 4 small objects of 1 cm 1 at a time placed in palm and retrieved out of palm 1 at a time.  Left hand much slower.  Cont for home                                                                                                        Modalities: Paraffin Time: 8 Location: Left hand and wrist To decrease pain and stiffness   Patient to continue with  scar massage to volar scar at the wrist as well as fitted with a Cica -Care scar pad for nighttime .  With a Tubigrip D for light compression. Scar mobilization done by OT manually and using mini massager  Also done metacarpal spreads and Carpal spreads done  Done Graston tool #6 brushing over volar hand and scar - great success  Done soft tissue facilitation with palm on soft blue putty rolling on all directions and palm and arches with light pressure in all directions 5 reps -add to HEP- Gripping add 20 reps 2x day light blue putty  Tolerating well    Patient to continue with tendon glides pain-free focusing on motion more than forcing 12 reps Opposition to all digits 8 reps Above 2 exercises to be done prior.  Fine motor and dexterity exercise. Patient can also do individual tendon glides as well as working on different signs with hide hand  Cont prayer stretch for composite wrist extension 10 reps pain-free Followed by table slides 20 reps added to home exercises pain-free to 3 times a day And wall pushups  Patient can continue with 16 oz hammer or 1 pound weight for wrist and forearm  .  Wrist flexion extension and supination pronation as well as radial and ulnar deviation 2 sets of  12 reps 2 times a day  At this date yellow Thera-Band for shoulder retraction and extension 2 sets of 12 pain-free once a day As well as  elbow extension 2 sets of 10 once a day symptom-free   Continue with medial nerve glide 5 reps 2 times a day Patient needed some verbal cueing not to force rotation and supination irritating ulnar side of the wrist.         PATIENT EDUCATION: Education details: findings of eval and HEP  Person educated: Patient Education method: Explanation, Demonstration, Tactile cues, Verbal cues, and Handouts Education comprehension: verbalized understanding, returned demonstration, verbal cues required, and needs further education     GOALS: Goals reviewed with patient? Yes  LONG TERM GOALS: Target date: 8 wks   Pain in left hand decreased to less  than 2/10 with composite fisting as well as using left hand in bathing dressing Baseline: Pain 7-8/10 at volar wrist ulnar hand and fifth digit.  With any attempts of flexion. Goal status: INITIAL  2.  Left wrist and forearm AROM improve within normal limits symptom-free to use in bathing and dressing Baseline: AROM at the wrist left 65 extension 68 flexion forearm or radial ulnar deviation within normal limits but pain 7-8/10 at wrist ulnarly and hand and fifth digit Goal status: INITIAL  3.  Left grip and prehension strength improved within normal range age for patient symptom-free to be able to squeeze a washcloth pick up a drink pull up pants perform hygiene Baseline:  Grip strength: Right: 56 lbs; Left: 19 lbs, Lateral pinch: Right: 14 lbs, Left: 8 lbs, and 3 point pinch: Right: 11 lbs, Left: 7 lbs Goal status: INITIAL  4.  Left hand and wrist improved for patient to be able to push and pull door, turn a doorknob cut food open container symptom-free Baseline: Pain 7-8/10 with any attempts of making composite fist as well as wrist flexion extension decreased pain at volar and ulnar wrist as well as ulnar hand and fifth digit Goal status: INITIAL   4.  Left hand and wrist improve on PRWHE by more than 30 points  Baseline: Pain on PRWHE at  evaluation 42/50  goal status: INITIAL   4.  Patient function of using left hand on PRWHE improve with more than 15 points Baseline: PRWHE score for function at eval is 26/50  Goal status: INITIAL    ASSESSMENT:  CLINICAL IMPRESSION: Patient seen for left carpal tunnel revision on 08/26/2023 by Dr.Poggi -patient present at OT evaluation with pain 7-8/10 at the ulnar and volar wrist as well as ulnar hand and fifth digit.  Patient very protective and guarding of left hand use and attempts of range of motion.  Patient limited in wrist flexion extension as well as grip and prehension strength.  NOW patient with great improvement with pain decreasing from a 8/10 and coming in today with 1/10 pain ulnar wrist - cont 1 lbs for wrist except RD and UD -and putty for gripping  -I do not Thera-Band for shoulder retraction as well as extension and elbow extension.  Patient limited in functional use of left hand in ADLs and IADLs.  Patient can benefit from skilled OT services to decrease scar tissue, decrease edema and pain and increase motion and strength to return to prior level of function.  PERFORMANCE DEFICITS: in functional skills including ADLs, IADLs, ROM, strength, pain, flexibility, decreased knowledge of use of DME, and UE functional use,   and psychosocial skills including environmental adaptation and routines and behaviors.   IMPAIRMENTS: are limiting patient from ADLs, IADLs, rest and sleep, play, leisure, and social participation.   COMORBIDITIES: has no other co-morbidities that affects occupational performance. Patient will benefit from skilled OT to address above impairments and improve overall function.  MODIFICATION OR ASSISTANCE TO COMPLETE EVALUATION: No modification of tasks or assist necessary to complete an evaluation.  OT OCCUPATIONAL PROFILE AND HISTORY: Problem focused assessment: Including review of records relating to presenting problem.  CLINICAL DECISION MAKING: LOW -  limited treatment options, no task modification necessary  REHAB POTENTIAL: Good for goals  EVALUATION COMPLEXITY: Low      PLAN:  OT FREQUENCY: 1-2x/week  OT DURATION: 8 weeks  PLANNED INTERVENTIONS: 97168 OT Re-evaluation, 97535 self care/ADL training, 02889 therapeutic exercise, 97530 therapeutic activity, 97140 manual therapy, [id number]  ultrasound, 02981 paraffin, 97039 fluidotherapy, 97034 contrast bath, scar mobilization, passive range of motion, patient/family education, and DME and/or AE instructions    CONSULTED AND AGREED WITH PLAN OF CARE: Patient     Ancel Peters, OTR/L,CLT 01/19/2024, 2:14 PM

## 2024-01-21 ENCOUNTER — Ambulatory Visit: Admitting: Occupational Therapy

## 2024-01-21 DIAGNOSIS — G5601 Carpal tunnel syndrome, right upper limb: Secondary | ICD-10-CM

## 2024-01-21 DIAGNOSIS — M79642 Pain in left hand: Secondary | ICD-10-CM

## 2024-01-21 DIAGNOSIS — M25532 Pain in left wrist: Secondary | ICD-10-CM

## 2024-01-21 DIAGNOSIS — L905 Scar conditions and fibrosis of skin: Secondary | ICD-10-CM

## 2024-01-21 DIAGNOSIS — M25642 Stiffness of left hand, not elsewhere classified: Secondary | ICD-10-CM

## 2024-01-21 DIAGNOSIS — M6281 Muscle weakness (generalized): Secondary | ICD-10-CM

## 2024-01-21 NOTE — Therapy (Signed)
 OUTPATIENT OCCUPATIONAL THERAPY ORTHO TREATMENT  Patient Name: Tonya Keith MRN: [id number] DOB:May 14, 1961, 63 y.o., female Today's Date: 01/21/2024  PCP: Dr Idelia REFERRING PROVIDER: Dr Edie  END OF SESSION:  OT End of Session - 01/21/24 1314     Visit Number 8    Number of Visits 12    Date for OT Re-Evaluation 02/15/24    OT Start Time 1315    Activity Tolerance Patient tolerated treatment well    Behavior During Therapy [facility] for tasks assessed/performed          Past Medical History:  Diagnosis Date   Anemia    Anginal pain (HCC)    Aortic atherosclerosis (HCC)    Arthritis    Bilateral carpal tunnel syndrome    Bradycardia    Breast cancer, left (HCC) 2008   a.) s/p surgical resection (mastectomy) + systemic chemotherapy   Cardiac murmur    Cerebral tonsillar ectopia    COPD (chronic obstructive pulmonary disease) (HCC)    Coronary artery disease    a.) PCI 08/09/2007: 100% mRCA (2.25 x 23 mm Mini Vision BMS)   Depression    H. pylori infection    Hemoglobin C trait (HCC)    HLD (hyperlipidemia)    Hx of migraine headaches    Hypertension    Long-term use of aspirin  therapy    Marijuana use    Memory impairment    OSA (obstructive sleep apnea)    a.) does not require nocturnal PAP therapy   Osteopenia    a.) on oral bisphosphonate (alendronate)   Palpitations    Personal history of chemotherapy    Postural hypotension    Pulmonary granuloma (HCC)    Smoker    STEMI involving right coronary artery (HCC) 08/09/2007   a.) LHC/PCI 08/09/2007: 100% mRCA (2.5 x 23 mm Mini Vision BMS)   T2DM (type 2 diabetes mellitus) (HCC)    Trichomoniasis    Vitamin D  insufficiency    Wears dentures    full upper (currently unable to wear)   Past Surgical History:  Procedure Laterality Date   ABDOMINAL HYSTERECTOMY     ANKLE FRACTURE SURGERY Right    CARPAL TUNNEL RELEASE Left 01/27/2023   Procedure: CARPAL TUNNEL RELEASE;  Surgeon: Kathlynn Sharper, MD;  Location:  [facility];  Service: Orthopedics;  Laterality: Left;   CARPAL TUNNEL RELEASE Left 08/26/2023   Procedure: CARPAL TUNNEL RELEASE;  Surgeon: Edie Norleen PARAS, MD;  Location: [facility] ORS;  Service: Orthopedics;  Laterality: Left;   COLONOSCOPY N/A 11/30/2023   Procedure: COLONOSCOPY;  Surgeon: Maryruth Ole DASEN, MD;  Location: [facility] ENDOSCOPY;  Service: Endoscopy;  Laterality: N/A;   CORONARY ANGIOPLASTY WITH STENT PLACEMENT Left 08/09/2007   Procedure: CORONARY ANGIOPLASTY WITH STENT PLACEMENT; Location: [facility]; Surgeon: Margie Lovelace, MD   ESOPHAGOGASTRODUODENOSCOPY N/A 11/30/2023   Procedure: EGD (ESOPHAGOGASTRODUODENOSCOPY);  Surgeon: Maryruth Ole DASEN, MD;  Location: [facility] ENDOSCOPY;  Service: Endoscopy;  Laterality: N/A;   LEFT HEART CATH AND CORONARY ANGIOGRAPHY Left 09/07/2019   Procedure: LEFT HEART CATH AND CORONARY ANGIOGRAPHY;  Surgeon: Lovelace Cara BIRCH, MD;  Location: [facility] INVASIVE CV LAB;  Service: Cardiovascular;  Laterality: Left;   LEFT HEART CATH AND CORONARY ANGIOGRAPHY Left 06/12/2010   Procedure: LEFT HEART CATH AND CORONARY ANGIOGRAPHY; Location: [facility]; Surgeon: Margie Lovelace, MD   LESION EXCISION WITH COMPLEX REPAIR Right 12/04/2020   Procedure: Excision right upper lip intraoral lesion;  Surgeon: Blair Mt, MD;  Location: [facility];  Service: ENT;  Laterality:  Right;  Latex   MASTECTOMY Left 2008   MINOR EXCISION OF ORAL LESION N/A 10/02/2020   Procedure: excision of right upper lip mass, intraoral;  Surgeon: Blair Mt, MD;  Location: [facility];  Service: ENT;  Laterality: N/A;  Latex   WISDOM TOOTH EXTRACTION     Patient Active Problem List   Diagnosis Date Noted   Osteoarthritis of hips (Bilateral) 11/03/2022   Lumbar radiculitis (L5, S1) (Bilateral) (L>R) 11/03/2022   Lumbosacral radiculopathy/radiculitis at S1 (Bilateral) (L>R) 11/03/2022   Lumbosacral radiculopathy/radiculitis at L5 (Bilateral) (L>R) 11/03/2022   Abnormal drug screen  (09/22/2022) 09/25/2022   Chronic hip pain (Bilateral) (R>L) 09/22/2022   Decreased range of motion of hips (Bilateral) (R>L) 09/22/2022   Chronic low back pain (2ry area of Pain) (Bilateral) (R>L) w/o sciatica 09/22/2022   Decreased range of motion of lumbar spine 09/22/2022   Generalized osteoarthritis of multiple sites 09/22/2022   History of marijuana use 09/22/2022   Heart murmur 09/21/2022   Myocardial infarction (HCC) 09/21/2022   Chronic pain syndrome 09/21/2022   Pharmacologic therapy 09/21/2022   Disorder of skeletal system 09/21/2022   Problems influencing health status 09/21/2022   Memory impairment 08/15/2022   Type 2 diabetes mellitus (HCC) 02/04/2022   Other insomnia 08/28/2019   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   Chronic obstructive pulmonary disease (HCC) 03/06/2019   Coronary artery disease involving native coronary artery of native heart without angina pectoris 03/06/2019   Major depressive disorder, recurrent, moderate (HCC) 03/06/2019   Malignant neoplasm of left female breast (HCC) 03/06/2019   Osteopenia 03/06/2019   Vitamin D  insufficiency 03/06/2019   Hypothermia 12/03/2017   Chronic pain of lower extremity (1ry area of Pain) (Bilateral) 11/17/2017   Bilateral lower extremity edema 11/17/2017   Moderate tobacco use disorder 11/17/2017   Hypertension 11/17/2017   Hypercholesterolemia 11/17/2017    ONSET DATE: 08/26/23  REFERRING DIAG: L  CTR revision  THERAPY DIAG:  Carpal tunnel syndrome, right upper limb  Pain in left hand  Pain in left wrist  Rationale for Evaluation and Treatment: Rehabilitation  SUBJECTIVE:   SUBJECTIVE STATEMENT: I didn't have any pain with band exercises, I have been doing my massages. Using my hand in every thing  pt accompanied by: self  PERTINENT HISTORY: Dr Edie appt 01/30/24 - refer to OT again - Tonya Keith is a 63 y.o. female who presents for follow-up now 3 months status post a revision open left carpal tunnel  release. The patient continues to note moderate to severe pain in her left wrist and hand with occasional radiation into the volar forearm region. She rates his pain as high as 8/10, and has been taking Tylenol  and soaking her hand in hot water with limited benefit. She did not attend formal occupational therapy as recommended at her last visit, stating that she called but they did not have any openings at the time. She has been trying to do some exercises on her own at home with limited benefit. She denies any reinjury to the wrist, and denies any fevers or chills.  Regional left carpal tunnel release for September 24  PRECAUTIONS: None     WEIGHT BEARING RESTRICTIONS: No  PAIN:  Are you having pain? No pain   FALLS: Has patient fallen in last 6 months? No  LIVING ENVIRONMENT: Lives with: lives alone   PLOF: Independent in bathing and dressing as well as like cooking and laundry.  Some it takes her to get groceries.  Lives in  the apartment.  1 step to get into  PATIENT GOALS: I want to get the pain better, my motion and strength so I can use it like my right hand -thank you help miliaris your  NEXT MD VISIT: ?  OBJECTIVE:  Note: Objective measures were completed at Evaluation unless otherwise noted.  HAND DOMINANCE: Right  ADLs: Unable to grip, squeeze, pick up, carry, push or pull with left hand  FUNCTIONAL OUTCOME MEASURES: PRWHE pain 42/50; function 26/50   UPPER EXTREMITY ROM:     Active ROM Right eval Left eval 12/25/23 L 8/5L/25  Shoulder flexion      Shoulder abduction      Shoulder adduction      Shoulder extension      Shoulder internal rotation      Shoulder external rotation      Elbow flexion      Elbow extension      Wrist flexion  68 80 100  Wrist extension  65 68 75  Wrist ulnar deviation  30    Wrist radial deviation  25    Wrist pronation  90    Wrist supination  90     (Blank rows = not tested) Pain with all wrist AROM - 7-8/10 pain ulnar  wrist and hand into the forearm and hand at evaluation  Active ROM Right eval Left eval Left 12/25/23  Thumb MCP (0-60)     Thumb IP (0-80)     Thumb Radial abd/add (0-55)      Thumb Palmar abd/add (0-45)      Thumb Opposition to Small Finger      Index MCP (0-90)  80    Index PIP (0-100)  100    Index DIP (0-70)       Long MCP (0-90)   90    Long PIP (0-100)    100   Long DIP (0-70)       Ring MCP (0-90)    90   Ring PIP (0-100)   100    Ring DIP (0-70)       Little MCP (0-90)   90    Little PIP (0-100)   100    Little DIP (0-70)       (Blank rows = not tested)   HAND FUNCTION: Grip strength: Right: 56 lbs; Left: 19 lbs, Lateral pinch: Right: 14 lbs, Left: 8 lbs, and 3 point pinch: Right: 11 lbs, Left: 7 lbs Pain 7-8/10 on left hand with all grip and prehension 12/25/23 Grip strength: Right: 56 lbs; Left: 35 lbs, Lateral pinch: Right: 14 lbs, Left: 12 lbs, and 3 point pinch: Right: 11 lbs, Left: 8 lbs 01/12/24 Grip strength: Right: 56 lbs; Left: 45 lbs, Lateral pinch: Right: 14 lbs, Left: 12 lbs, and 3 point pinch: Right: 11 lbs, Left: 8 lbs 01/19/24 Grip strength: Right: 56 lbs; Left: 46 lbs, Lateral pinch: Right: 14 lbs, Left: 13 lbs, and 3 point pinch: Right: 11 lbs, Left: 11 lbs  COORDINATION: Decreased because of increased pain and range of motion  SENSATION: Patient report numbness in 2nd, 3rd and 4th digits will assess Semmes-Weinstein  Now patient reports normal sensation denies numbness  EDEMA: Over the ulnar wrist  COGNITION: Overall cognitive status: Within functional limits for tasks assessed     TREATMENT DATE: 01/21/24          R patient arrived today with less pain at the wrist as well as the ulnar hand and fifth digit. Patient was  able to carry and lift 8 pounds with minimal discomfort, supination/pronation with 6 pounds no pain. Simulate pouring drink 6 lbs symptom free Opening containers and zip locks  med and small without pain.  [facility] digits <> palm -  2 objects no issues  Scar mobilization done by OT manually and using graston tool #6 brushing strokes over volar hand and scar Scar mobilization with coban on scar on volar wrist and proximal CT - incombination with wrist flexion and extention -add to HEP  Tolerated metacarpal spreads and Carpal spreads   Modalities: Paraffin Time: 8 Location: Left hand and wrist To decrease pain and stiffness   Patient to continue with scar massage to volar scar at the wrist as well as fitted with a Cica -Care scar pad for nighttime.  With a Tubigrip D for light compression.   Patient to continue with tendon glides pain-free focusing on motion more than forcing 12 reps Opposition to all digits 8 reps Above 2 exercises to be done prior.  Fine motor and dexterity exercise. Patient can also do individual tendon glides as well as working on different signs with hide hand  Cont prayer stretch for composite wrist extension 10 reps pain-free Followed by table slides 20 reps added to home exercises pain-free to 3 times a day And wall pushups  Patient can continue with 16 oz hammer or 1 pound weight for wrist and forearm  Wrist flexion extension and supination pronation as well as radial and ulnar deviation 2 sets of  12 reps 2 times a day  Upgrade to Red  Thera-Band for shoulder retraction and extension 2 sets of 12 pain-free once a day As well as elbow extension 2 sets of 10 once a day symptom-free Tolerate well   Continue with medial nerve glide 5 reps 2 times a day Patient needed some verbal cueing not to force rotation and supination irritating ulnar side of the wrist.       PATIENT EDUCATION: Education details: findings of eval and HEP  Person educated: Patient Education method: Explanation, Demonstration, Tactile cues, Verbal cues, and Handouts Education comprehension: verbalized understanding, returned demonstration, verbal cues required, and needs further education     GOALS: Goals  reviewed with patient? Yes  LONG TERM GOALS: Target date: 8 wks   Pain in left hand decreased to less than 2/10 with composite fisting as well as using left hand in bathing dressing Baseline: Pain 7-8/10 at volar wrist ulnar hand and fifth digit.  With any attempts of flexion. Goal status: INITIAL  2.  Left wrist and forearm AROM improve within normal limits symptom-free to use in bathing and dressing Baseline: AROM at the wrist left 65 extension 68 flexion forearm or radial ulnar deviation within normal limits but pain 7-8/10 at wrist ulnarly and hand and fifth digit Goal status: INITIAL  3.  Left grip and prehension strength improved within normal range age for patient symptom-free to be able to squeeze a washcloth pick up a drink pull up pants perform hygiene Baseline:  Grip strength: Right: 56 lbs; Left: 19 lbs, Lateral pinch: Right: 14 lbs, Left: 8 lbs, and 3 point pinch: Right: 11 lbs, Left: 7 lbs Goal status: INITIAL  4.  Left hand and wrist improved for patient to be able to push and pull door, turn a doorknob cut food open container symptom-free Baseline: Pain 7-8/10 with any attempts of making composite fist as well as wrist flexion extension decreased pain at volar and ulnar wrist as well as  ulnar hand and fifth digit Goal status: INITIAL   4.  Left hand and wrist improve on PRWHE by more than 30 points  Baseline: Pain on PRWHE at evaluation 42/50  goal status: INITIAL   4.  Patient function of using left hand on PRWHE improve with more than 15 points Baseline: PRWHE score for function at eval is 26/50  Goal status: INITIAL    ASSESSMENT:  CLINICAL IMPRESSION: Patient seen for left carpal tunnel revision on 08/26/2023 by Dr.Poggi -patient present at OT evaluation with pain 7-8/10 at the ulnar and volar wrist as well as ulnar hand and fifth digit.  Patient very protective and guarding of left hand use and attempts of range of motion.  Patient limited in wrist flexion  extension as well as grip and prehension strength.  NOW patient with great improvement with pain decreasing from a 8/10 and coming in today with 0/10 pain ulnar wrist - cont 1 lbs for wrist except RD and UD -and putty for gripping. Increased to red Thera-Band for shoulder retraction as well as extension and elbow extension.  Patient limited in functional use of left hand in ADLs and IADLs.  Patient can benefit from skilled OT services to decrease scar tissue, decrease edema and pain and increase motion and strength to return to prior level of function.  PERFORMANCE DEFICITS: in functional skills including ADLs, IADLs, ROM, strength, pain, flexibility, decreased knowledge of use of DME, and UE functional use,   and psychosocial skills including environmental adaptation and routines and behaviors.   IMPAIRMENTS: are limiting patient from ADLs, IADLs, rest and sleep, play, leisure, and social participation.   COMORBIDITIES: has no other co-morbidities that affects occupational performance. Patient will benefit from skilled OT to address above impairments and improve overall function.  MODIFICATION OR ASSISTANCE TO COMPLETE EVALUATION: No modification of tasks or assist necessary to complete an evaluation.  OT OCCUPATIONAL PROFILE AND HISTORY: Problem focused assessment: Including review of records relating to presenting problem.  CLINICAL DECISION MAKING: LOW - limited treatment options, no task modification necessary  REHAB POTENTIAL: Good for goals  EVALUATION COMPLEXITY: Low      PLAN:  OT FREQUENCY: 1-2x/week  OT DURATION: 8 weeks  PLANNED INTERVENTIONS: 97168 OT Re-evaluation, 97535 self care/ADL training, 02889 therapeutic exercise, 97530 therapeutic activity, 97140 manual therapy, 97035 ultrasound, 97018 paraffin, 02960 fluidotherapy, 97034 contrast bath, scar mobilization, passive range of motion, patient/family education, and DME and/or AE instructions    CONSULTED AND AGREED  WITH PLAN OF CARE: Patient     Elston JINNY Slot, OTR/L 01/21/2024, 1:15 PM

## 2024-02-01 ENCOUNTER — Ambulatory Visit: Admitting: Occupational Therapy

## 2024-02-08 ENCOUNTER — Other Ambulatory Visit: Payer: Self-pay

## 2024-02-08 ENCOUNTER — Emergency Department
Admission: EM | Admit: 2024-02-08 | Discharge: 2024-02-08 | Disposition: A | Discharge status: Home / Self Care | Attending: Emergency Medicine | Admitting: Emergency Medicine

## 2024-02-08 DIAGNOSIS — H9201 Otalgia, right ear: Secondary | ICD-10-CM | POA: Insufficient documentation

## 2024-02-08 DIAGNOSIS — J449 Chronic obstructive pulmonary disease, unspecified: Secondary | ICD-10-CM | POA: Diagnosis not present

## 2024-02-08 DIAGNOSIS — I10 Essential (primary) hypertension: Secondary | ICD-10-CM | POA: Diagnosis not present

## 2024-02-08 DIAGNOSIS — I2510 Atherosclerotic heart disease of native coronary artery without angina pectoris: Secondary | ICD-10-CM | POA: Insufficient documentation

## 2024-02-08 MED ORDER — NEOMYCIN-POLYMYXIN-HC 3.5-10000-1 OT SOLN: 3.0000 [drp] | Freq: Three times a day (TID) | OTIC | 0 refills | Status: AC

## 2024-02-08 MED ORDER — AMOXICILLIN 875 MG PO TABS: 875.0000 mg | ORAL_TABLET | Freq: Two times a day (BID) | ORAL | 0 refills | Status: AC

## 2024-02-08 NOTE — ED Provider Notes (Signed)
 [facility] Emergency Department Provider Note     Event Date/Time   First MD Initiated Contact with Patient 02/08/24 1533     (approximate)   History   Otalgia   HPI  Tonya Keith is a 63 y.o. female with a PMHx of HTN, COPD, arthritis, osteopenia, CAD, anemia, HLD and OSA presents to the ED for evaluation of persistent right ear pain x 3 weeks. Patient reports the pain went away and then returned. Worse with yawning and touching the ear. Endorses subjective fevers. Has taken nothing for pain. Denies sick contacts, discharge, loss of hearing or injuries.      Physical Exam   Triage Vital Signs: ED Triage Vitals  Encounter Vitals Group     BP 02/08/24 1512 122/68     Girls Systolic BP Percentile --      Girls Diastolic BP Percentile --      Boys Systolic BP Percentile --      Boys Diastolic BP Percentile --      Pulse Rate 02/08/24 1512 80     Resp 02/08/24 1512 16     Temp 02/08/24 1512 97.9 F (36.6 C)     Temp Source 02/08/24 1512 Oral     SpO2 02/08/24 1512 100 %     Weight 02/08/24 1511 106 lb 14.8 oz (48.5 kg)     Height --      Head Circumference --      Peak Flow --      Pain Score 02/08/24 1511 9     Pain Loc --      Pain Education --      Exclude from Growth Chart --     Most recent vital signs: Vitals:   02/08/24 1512 02/08/24 1618  BP: 122/68   Pulse: 80 71  Resp: 16 16  Temp: 97.9 F (36.6 C)   SpO2: 100% 96%    General Awake, no distress.  HEENT NCAT.  CV:  Good peripheral perfusion.  RESP:  Normal effort.  ABD:  No distention.  Other:  Patient removed wrapped tissue from right EAC. Bilateral EACs patent. Tympanic membranes white and cloudy bilaterally. No bulging, erythema or discharge.      ED Results / Procedures / Treatments   Labs (all labs ordered are listed, but only abnormal results are displayed) Labs Reviewed - No data to display No results found.  PROCEDURES:  Critical Care performed:  No  Procedures   MEDICATIONS ORDERED IN ED: Medications - No data to display   IMPRESSION / MDM / ASSESSMENT AND PLAN / ED COURSE  I reviewed the triage vital signs and the nursing notes.                               63 y.o. female presents to the emergency department for evaluation and treatment of Right ear pain x 3 weeks . See HPI for further details.   Differential diagnosis includes, but is not limited to otitis externa, otitis media, TMJ disorder, muscle strain, less likely mastoiditis   Patient's presentation is most consistent with acute, uncomplicated illness.  Patient is alert and oriented.  She is hemodynamically stable.  Physical exam findings are stated above.  Overall benign ear exam.  No obvious suspicion for inner ear or outer ear infection however given length of ear pain will treat with antibiotics as patient did state subjective fever at home.  Advised patient to use otic antibiotic eardrops for 3 days and if symptoms are unchanged to begin p.o. antibiotics.  Advised follow-up with ENT if symptoms do not improve.  Patient verbalized understanding.  Symptomatic treatment discussed.  Advised refraining from Q-tip use.  Patient stable condition for discharge home.  advised to follow-up with primary care provider in 1 week.  ED return precaution discussed.  FINAL CLINICAL IMPRESSION(S) / ED DIAGNOSES   Final diagnoses:  Otalgia of right ear    Rx / DC Orders   ED Discharge Orders          Ordered    neomycin -polymyxin-hydrocortisone (CORTISPORIN) OTIC solution  3 times daily        02/08/24 1603    amoxicillin  (AMOXIL ) 875 MG tablet  2 times daily        02/08/24 1603           Note:  This document was prepared using Dragon voice recognition software and may include unintentional dictation errors.    Margrette, Delan Ksiazek A, PA-C 02/08/24 1647    Levander Slate, MD 02/08/24 [phone]

## 2024-02-08 NOTE — ED Triage Notes (Signed)
 Right ear pain x 3 weeks

## 2024-02-08 NOTE — Discharge Instructions (Signed)
 You were evaluated in the ED for ear pain.  Your physical exam findings are reassuring of any acute or life-threatening injuries/illness.  Take the biotic eardrops for 3 days.  If your symptoms do not improve then take the oral antibiotics until dose is complete.  If your symptoms persist please follow-up with ENT.  Call and schedule appointment with Dr. Herminio. Apply warm compresses over the affected ear for comfort.  Consider lidocaine  and/or IcyHot patches to apply over your neck.  Follow-up with your primary care provider in 1 week.  If any new or worsening symptoms return to ED for further evaluation.  Pain control:  Ibuprofen  (motrin /aleve/advil ) - You can take 3 tablets (600 mg) every 6 hours as needed for pain/fever.  Acetaminophen  (tylenol ) - You can take 2 extra strength tablets (1000 mg) every 6 hours as needed for pain/fever.  You can alternate these medications or take them together.  Make sure you eat food/drink water when taking these medications.

## 2024-02-09 ENCOUNTER — Encounter: Admitting: Occupational Therapy

## 2024-02-11 ENCOUNTER — Ambulatory Visit: Admitting: Occupational Therapy

## 2024-02-11 ENCOUNTER — Other Ambulatory Visit: Payer: Self-pay | Admitting: Family Medicine

## 2024-02-11 DIAGNOSIS — Z780 Asymptomatic menopausal state: Secondary | ICD-10-CM

## 2024-02-16 ENCOUNTER — Ambulatory Visit: Admitting: Occupational Therapy

## 2024-02-22 ENCOUNTER — Ambulatory Visit
Admission: RE | Admit: 2024-02-22 | Discharge: 2024-02-22 | Disposition: A | Discharge status: Home / Self Care | Attending: Gastroenterology | Admitting: Gastroenterology

## 2024-02-22 ENCOUNTER — Ambulatory Visit: Admitting: Certified Registered Nurse Anesthetist

## 2024-02-22 ENCOUNTER — Encounter: Payer: Self-pay | Admitting: Gastroenterology

## 2024-02-22 ENCOUNTER — Encounter
Admission: RE | Disposition: A | Discharge status: Home / Self Care | Payer: Self-pay | Source: Home / Self Care | Attending: Gastroenterology

## 2024-02-22 DIAGNOSIS — G4733 Obstructive sleep apnea (adult) (pediatric): Secondary | ICD-10-CM | POA: Insufficient documentation

## 2024-02-22 DIAGNOSIS — Z79899 Other long term (current) drug therapy: Secondary | ICD-10-CM | POA: Insufficient documentation

## 2024-02-22 DIAGNOSIS — Z7982 Long term (current) use of aspirin: Secondary | ICD-10-CM | POA: Diagnosis not present

## 2024-02-22 DIAGNOSIS — I10 Essential (primary) hypertension: Secondary | ICD-10-CM | POA: Insufficient documentation

## 2024-02-22 DIAGNOSIS — I2510 Atherosclerotic heart disease of native coronary artery without angina pectoris: Secondary | ICD-10-CM | POA: Diagnosis not present

## 2024-02-22 DIAGNOSIS — Z955 Presence of coronary angioplasty implant and graft: Secondary | ICD-10-CM | POA: Insufficient documentation

## 2024-02-22 DIAGNOSIS — K3189 Other diseases of stomach and duodenum: Secondary | ICD-10-CM | POA: Diagnosis not present

## 2024-02-22 DIAGNOSIS — F32A Depression, unspecified: Secondary | ICD-10-CM | POA: Insufficient documentation

## 2024-02-22 DIAGNOSIS — K2970 Gastritis, unspecified, without bleeding: Secondary | ICD-10-CM | POA: Insufficient documentation

## 2024-02-22 DIAGNOSIS — Z7984 Long term (current) use of oral hypoglycemic drugs: Secondary | ICD-10-CM | POA: Insufficient documentation

## 2024-02-22 DIAGNOSIS — D649 Anemia, unspecified: Secondary | ICD-10-CM | POA: Insufficient documentation

## 2024-02-22 DIAGNOSIS — F17200 Nicotine dependence, unspecified, uncomplicated: Secondary | ICD-10-CM | POA: Insufficient documentation

## 2024-02-22 DIAGNOSIS — E785 Hyperlipidemia, unspecified: Secondary | ICD-10-CM | POA: Diagnosis not present

## 2024-02-22 DIAGNOSIS — I252 Old myocardial infarction: Secondary | ICD-10-CM | POA: Diagnosis not present

## 2024-02-22 DIAGNOSIS — Z7983 Long term (current) use of bisphosphonates: Secondary | ICD-10-CM | POA: Diagnosis not present

## 2024-02-22 DIAGNOSIS — J449 Chronic obstructive pulmonary disease, unspecified: Secondary | ICD-10-CM | POA: Diagnosis not present

## 2024-02-22 DIAGNOSIS — E119 Type 2 diabetes mellitus without complications: Secondary | ICD-10-CM | POA: Diagnosis not present

## 2024-02-22 DIAGNOSIS — R1013 Epigastric pain: Secondary | ICD-10-CM | POA: Diagnosis present

## 2024-02-22 HISTORY — PX: ESOPHAGOGASTRODUODENOSCOPY: SHX5428

## 2024-02-22 SURGERY — EGD (ESOPHAGOGASTRODUODENOSCOPY)
Anesthesia: General

## 2024-02-22 MED ORDER — GLYCOPYRROLATE 0.2 MG/ML IJ SOLN
INTRAMUSCULAR | Status: DC | PRN
  Administered 2024-02-22: .2 mg via INTRAVENOUS

## 2024-02-22 MED ORDER — SODIUM CHLORIDE 0.9 % IV SOLN: INTRAVENOUS | Status: DC

## 2024-02-22 MED ORDER — LIDOCAINE HCL (CARDIAC) PF 100 MG/5ML IV SOSY
PREFILLED_SYRINGE | INTRAVENOUS | Status: DC | PRN
  Administered 2024-02-22: 50 mg via INTRAVENOUS

## 2024-02-22 MED ORDER — PROPOFOL 10 MG/ML IV BOLUS
INTRAVENOUS | Status: DC | PRN
  Administered 2024-02-22 (×2): 20 mg via INTRAVENOUS
  Administered 2024-02-22: 40 mg via INTRAVENOUS

## 2024-02-22 MED ORDER — PROPOFOL 500 MG/50ML IV EMUL
INTRAVENOUS | Status: DC | PRN
  Administered 2024-02-22: 140 ug/kg/min via INTRAVENOUS

## 2024-02-22 NOTE — Interval H&P Note (Signed)
 History and Physical Interval Note:  02/22/2024 9:09 AM  Tonya Keith  has presented today for surgery, with the diagnosis of K21.9  - Gastroesophageal reflux disease, unspecified whether esophagitis present.  The various methods of treatment have been discussed with the patient and family. After consideration of risks, benefits and other options for treatment, the patient has consented to  Procedure(s): EGD (ESOPHAGOGASTRODUODENOSCOPY) (N/A) as a surgical intervention.  The patient's history has been reviewed, patient examined, no change in status, stable for surgery.  I have reviewed the patient's chart and labs.  Questions were answered to the patient's satisfaction.     Tonya Keith  Ok to proceed with EGD

## 2024-02-22 NOTE — Transfer of Care (Signed)
 Immediate Anesthesia Transfer of Care Note  Patient: Tonya Keith  Procedure(s) Performed: EGD (ESOPHAGOGASTRODUODENOSCOPY)  Patient Location: PACU  Anesthesia Type:General  Level of Consciousness: awake and alert   Airway & Oxygen Therapy: Patient Spontanous Breathing  Post-op Assessment: Report given to RN and Post -op Vital signs reviewed and stable  Post vital signs: Reviewed and stable  Last Vitals:  Vitals Value Taken Time  BP    Temp    Pulse    Resp    SpO2      Last Pain:  Vitals:   02/22/24 0854  TempSrc: Temporal  PainSc: 0-No pain         Complications: No notable events documented.

## 2024-02-22 NOTE — Op Note (Signed)
 [facility] Patient Name: Tonya Keith Procedure Date: 02/22/2024 9:05 AM MRN: [id number] Account #: [id number] Date of Birth: March 24, 1961 Admit Type: Outpatient Age: 63 Room: [facility] ENDO ROOM 3 Gender: Female Note Status: Finalized Instrument Name: Barnie GI Scope [id number] Procedure:             Upper GI endoscopy Indications:           Dyspepsia Providers:             Ole Schick MD, MD Referring MD:          Ole Schick MD, MD (Referring MD), Sionne A.                         Zachary MD, MD (Referring MD) Medicines:             Monitored Anesthesia Care Complications:         No immediate complications. Estimated blood loss:                         Minimal. Procedure:             Pre-Anesthesia Assessment:                        - Prior to the procedure, a History and Physical was                         performed, and patient medications and allergies were                         reviewed. The patient is competent. The risks and                         benefits of the procedure and the sedation options and                         risks were discussed with the patient. All questions                         were answered and informed consent was obtained.                         Patient identification and proposed procedure were                         verified by the physician, the nurse, the                         anesthesiologist, the anesthetist and the technician                         in the endoscopy suite. Mental Status Examination:                         alert and oriented. Airway Examination: normal                         oropharyngeal airway and neck mobility. Respiratory  Examination: clear to auscultation. CV Examination:                         normal. Prophylactic Antibiotics: The patient does not                         require prophylactic antibiotics. Prior                         Anticoagulants:  The patient has taken no anticoagulant                         or antiplatelet agents. ASA Grade Assessment: III - A                         patient with severe systemic disease. After reviewing                         the risks and benefits, the patient was deemed in                         satisfactory condition to undergo the procedure. The                         anesthesia plan was to use monitored anesthesia care                         (MAC). Immediately prior to administration of                         medications, the patient was re-assessed for adequacy                         to receive sedatives. The heart rate, respiratory                         rate, oxygen saturations, blood pressure, adequacy of                         pulmonary ventilation, and response to care were                         monitored throughout the procedure. The physical                         status of the patient was re-assessed after the                         procedure.                        After obtaining informed consent, the endoscope was                         passed under direct vision. Throughout the procedure,                         the patient's blood pressure, pulse, and oxygen  saturations were monitored continuously. The Endoscope                         was introduced through the mouth, and advanced to the                         second part of duodenum. The upper GI endoscopy was                         accomplished without difficulty. The patient tolerated                         the procedure well. Findings:      The examined esophagus was normal.      Patchy moderate inflammation characterized by erythema was found in the       gastric antrum. Biopsies were taken with a cold forceps for Helicobacter       pylori testing. Estimated blood loss was minimal.      The examined duodenum was normal. Impression:            - Normal esophagus.                         - Gastritis. Biopsied.                        - Normal examined duodenum. Recommendation:        - Discharge patient to home.                        - Resume previous diet.                        - Continue present medications.                        - Await pathology results.                        - Return to referring physician as previously                         scheduled. Procedure Code(s):     --- Professional ---                        [phone], Esophagogastroduodenoscopy, flexible,                         transoral; with biopsy, single or multiple Diagnosis Code(s):     --- Professional ---                        K29.70, Gastritis, unspecified, without bleeding                        R10.13, Epigastric pain CPT copyright 2022 [employer]. All rights reserved. The codes documented in this report are preliminary and upon coder review may  be revised to meet current compliance requirements. Ole Schick MD, MD 02/22/2024 9:21:59 AM Number of Addenda: 0 Note Initiated On: 02/22/2024 9:05 AM Estimated Blood Loss:  Estimated blood loss was minimal.  [facility]

## 2024-02-22 NOTE — Anesthesia Postprocedure Evaluation (Signed)
 Anesthesia Post Note  Patient: Tonya Keith  Procedure(s) Performed: EGD (ESOPHAGOGASTRODUODENOSCOPY)  Patient location during evaluation: PACU Anesthesia Type: General Level of consciousness: awake and awake and alert Pain management: pain level controlled Vital Signs Assessment: post-procedure vital signs reviewed and stable Respiratory status: nonlabored ventilation Cardiovascular status: stable Anesthetic complications: no   No notable events documented.   Last Vitals:  Vitals:   02/22/24 0921 02/22/24 0931  BP: (!) 152/88 109/77  Pulse: (!) 121 87  Resp: (!) 26 18  Temp: (!) 35.8 C   SpO2: 100% 100%    Last Pain:  Vitals:   02/22/24 0931  TempSrc:   PainSc: 0-No pain                 VAN STAVEREN,Trissa Molina

## 2024-02-22 NOTE — Anesthesia Procedure Notes (Signed)
 Date/Time: 02/22/2024 9:08 AM  Performed by: Duwayne Craven, CRNAPre-anesthesia Checklist: Patient identified, Emergency Drugs available, Suction available, Patient being monitored and Timeout performed Patient Re-evaluated:Patient Re-evaluated prior to induction Oxygen Delivery Method: Nasal cannula Induction Type: IV induction Placement Confirmation: CO2 detector and positive ETCO2

## 2024-02-22 NOTE — Anesthesia Preprocedure Evaluation (Signed)
 Anesthesia Evaluation  Patient identified by MRN, date of birth, ID band Patient awake    Reviewed: Allergy & Precautions, NPO status , Patient's Chart, lab work & pertinent test results  Airway Mallampati: II  TM Distance: >3 FB Neck ROM: full    Dental  (+) Edentulous Upper   Pulmonary neg pulmonary ROS, sleep apnea , COPD,  COPD inhaler, Current Smoker and Patient abstained from smoking.   Pulmonary exam normal  + decreased breath sounds      Cardiovascular Exercise Tolerance: Poor hypertension, Pt. on medications + CAD, + Past MI and + Cardiac Stents  negative cardio ROS Normal cardiovascular exam Rhythm:Regular Rate:Normal     Neuro/Psych    Depression    negative neurological ROS  negative psych ROS   GI/Hepatic negative GI ROS, Neg liver ROS,,,  Endo/Other  negative endocrine ROSdiabetes, Type 2, Oral Hypoglycemic Agents    Renal/GU negative Renal ROS  negative genitourinary   Musculoskeletal   Abdominal  (+) + scaphoid   Peds negative pediatric ROS (+)  Hematology negative hematology ROS (+) Blood dyscrasia, anemia   Anesthesia Other Findings Past Medical History: No date: Anemia No date: Anginal pain No date: Aortic atherosclerosis No date: Arthritis No date: Bilateral carpal tunnel syndrome No date: Bradycardia 2008: Breast cancer, left (HCC)     Comment:  a.) s/p surgical resection (mastectomy) + systemic               chemotherapy No date: Cardiac murmur No date: Cerebral tonsillar ectopia No date: COPD (chronic obstructive pulmonary disease) (HCC) No date: Coronary artery disease     Comment:  a.) PCI 08/09/2007: 100% mRCA (2.25 x 23 mm Mini Vision               BMS) No date: Depression No date: H. 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 No date: HLD (hyperlipidemia) No date: Hx of migraine headaches No date: Hypertension No date: Long-term use of aspirin  therapy No date: Marijuana  use No date: Memory impairment No date: OSA (obstructive sleep apnea)     Comment:  a.) does not require nocturnal PAP therapy No date: Osteopenia     Comment:  a.) on oral bisphosphonate (alendronate) No date: Palpitations No date: Personal history of chemotherapy No date: Postural hypotension No date: Pulmonary granuloma (HCC) No date: Smoker 08/09/2007: STEMI involving right coronary artery (HCC)     Comment:  a.) LHC/PCI 08/09/2007: 100% mRCA (2.5 x 23 mm Mini               Vision BMS) No date: T2DM (type 2 diabetes mellitus) (HCC) No date: Trichomoniasis No date: Vitamin D  insufficiency No date: Wears dentures     Comment:  full upper (currently unable to wear)  Past Surgical History: No date: ABDOMINAL HYSTERECTOMY No date: ANKLE FRACTURE SURGERY; Right 01/27/2023: CARPAL TUNNEL RELEASE; Left     Comment:  Procedure: CARPAL TUNNEL RELEASE;  Surgeon: Kathlynn Sharper, MD;  Location: [facility];  Service:               Orthopedics;  Laterality: Left; 08/26/2023: CARPAL TUNNEL RELEASE; Left     Comment:  Procedure: CARPAL TUNNEL RELEASE;  Surgeon: Edie Norleen PARAS, MD;  Location: [facility] ORS;  Service: Orthopedics;  Laterality: Left; 11/30/2023: COLONOSCOPY; N/A     Comment:  Procedure: COLONOSCOPY;  Surgeon: Maryruth Ole DASEN,               MD;  Location: [facility] ENDOSCOPY;  Service: Endoscopy;                Laterality: N/A; 08/09/2007: CORONARY ANGIOPLASTY WITH STENT PLACEMENT; Left     Comment:  Procedure: CORONARY ANGIOPLASTY WITH STENT PLACEMENT;               Location: [facility]; Surgeon: Margie Lovelace, MD 11/30/2023: ESOPHAGOGASTRODUODENOSCOPY; N/A     Comment:  Procedure: EGD (ESOPHAGOGASTRODUODENOSCOPY);  Surgeon:               Maryruth Ole DASEN, MD;  Location: [facility] ENDOSCOPY;                Service: Endoscopy;  Laterality: N/A; 09/07/2019: LEFT HEART CATH AND CORONARY ANGIOGRAPHY; Left     Comment:  Procedure: LEFT HEART CATH  AND CORONARY ANGIOGRAPHY;                Surgeon: Lovelace Cara BIRCH, MD;  Location: [facility] INVASIVE              CV LAB;  Service: Cardiovascular;  Laterality: Left; 06/12/2010: LEFT HEART CATH AND CORONARY ANGIOGRAPHY; Left     Comment:  Procedure: LEFT HEART CATH AND CORONARY ANGIOGRAPHY;               Location: [facility]; Surgeon: Margie Lovelace, MD 12/04/2020: LESION EXCISION WITH COMPLEX REPAIR; Right     Comment:  Procedure: Excision right upper lip intraoral lesion;                Surgeon: Blair Mt, MD;  Location: [facility]               [facility];  Service: ENT;  Laterality: Right;  Latex 2008: MASTECTOMY; Left 10/02/2020: MINOR EXCISION OF ORAL LESION; N/A     Comment:  Procedure: excision of right upper lip mass, intraoral;               Surgeon: Blair Mt, MD;  Location: [facility]               [facility];  Service: ENT;  Laterality: N/A;  Latex No date: WISDOM TOOTH EXTRACTION  BMI    Body Mass Index: 17.80 kg/m      Reproductive/Obstetrics negative OB ROS                              Anesthesia Physical Anesthesia Plan  ASA: 3  Anesthesia Plan: General   Post-op Pain Management:    Induction: Intravenous  PONV Risk Score and Plan: Propofol  infusion and TIVA  Airway Management Planned: Natural Airway and Nasal Cannula  Additional Equipment:   Intra-op Plan:   Post-operative Plan:   Informed Consent: I have reviewed the patients History and Physical, chart, labs and discussed the procedure including the risks, benefits and alternatives for the proposed anesthesia with the patient or authorized representative who has indicated his/her understanding and acceptance.     Dental Advisory Given  Plan Discussed with: CRNA  Anesthesia Plan Comments:         Anesthesia Quick Evaluation

## 2024-02-22 NOTE — H&P (Signed)
 Outpatient short stay form Pre-procedure 02/22/2024  Ole [name], MD  Primary Physician: George, Sionne A, MD  Reason for visit:  Dyspepsia  History of present illness:    63 y/o lady with history of hypertension, CAD, and arthritis here for EGD for dyspeptic symptoms. No blood thinners. No family history of GI malignancies. History of tubal ligation.    Current Facility-Administered Medications:    0.9 %  sodium chloride  infusion, , Intravenous, Continuous, Kaidan Harpster, Ole ONEIDA, MD, Last Rate: 20 mL/hr at 02/22/24 0904, Continued from Pre-op at 02/22/24 0904  Facility-Administered Medications Ordered in Other Encounters:    glycopyrrolate  (ROBINUL ) injection, , Intravenous, Anesthesia Intra-op, Duwayne Craven, CRNA, 0.2 mg at 02/22/24 0900  Medications Prior to Admission  Medication Sig Dispense Refill Last Dose/Taking   amLODipine (NORVASC) 10 MG tablet Take 10 mg by mouth daily.    02/21/2024   metoprolol succinate (TOPROL-XL) 100 MG 24 hr tablet Take 50 mg by mouth daily.   02/21/2024   albuterol  (VENTOLIN  HFA) 108 (90 Base) MCG/ACT inhaler Inhale 2 puffs into the lungs every 6 (six) hours as needed for wheezing or shortness of breath.      alendronate (FOSAMAX) 70 MG tablet Take 70 mg by mouth once a week.      amoxicillin  (AMOXIL ) 875 MG tablet Take 1 tablet (875 mg total) by mouth 2 (two) times daily. 14 tablet 0    aspirin  EC 81 MG tablet Take 81 mg by mouth daily.       azelastine (ASTELIN) 0.1 % nasal spray Place 2 sprays into both nostrils 2 (two) times daily as needed for allergies. Use in each nostril as directed      cetirizine (ZYRTEC) 10 MG tablet Take 10 mg by mouth daily as needed for allergies.      escitalopram (LEXAPRO) 20 MG tablet Take 20 mg by mouth daily.      gabapentin (NEURONTIN) 300 MG capsule Take 300 mg by mouth daily as needed (pain).      HYDROcodone -acetaminophen  (NORCO/VICODIN) 5-325 MG tablet Take 1-2 tablets by mouth every 4 (four) hours as  needed for moderate pain (pain score 4-6) or severe pain (pain score 7-10). 30 tablet 0    ipratropium (ATROVENT) 0.03 % nasal spray Place 2 sprays into both nostrils 2 (two) times daily as needed for rhinitis.      isosorbide mononitrate (IMDUR) 30 MG 24 hr tablet Take 30 mg by mouth daily.      lamoTRIgine (LAMICTAL) 100 MG tablet Take 100 mg by mouth 2 (two) times daily.      lovastatin (MEVACOR) 40 MG tablet Take 40 mg by mouth daily.       naproxen (NAPROSYN) 500 MG tablet Take 500 mg by mouth daily as needed for moderate pain (pain score 4-6).      nitroGLYCERIN (NITROSTAT) 0.4 MG SL tablet Place 0.4 mg under the tongue every 5 (five) minutes as needed for chest pain.      pregabalin (LYRICA) 75 MG capsule Take 75 mg by mouth daily.      Tiotropium Bromide Monohydrate 2.5 MCG/ACT AERS Inhale 1 puff into the lungs daily as needed (shortness of breath).      Vitamin D , Ergocalciferol , (DRISDOL) 1.25 MG (50000 UNIT) CAPS capsule Take 50,000 Units by mouth every Wednesday.        Allergies  Allergen Reactions   Ace Inhibitors Cough and Other (See Comments)   Pollen Extract    Latex Itching and Rash  Past Medical History:  Diagnosis Date   Anemia    Anginal pain    Aortic atherosclerosis    Arthritis    Bilateral carpal tunnel syndrome    Bradycardia    Breast cancer, left (HCC) 2008   a.) s/p surgical resection (mastectomy) + systemic chemotherapy   Cardiac murmur    Cerebral tonsillar ectopia    COPD (chronic obstructive pulmonary disease) (HCC)    Coronary artery disease    a.) PCI 08/09/2007: 100% mRCA (2.25 x 23 mm Mini Vision BMS)   Depression    H. pylori infection    Hemoglobin C trait    HLD (hyperlipidemia)    Hx of migraine headaches    Hypertension    Long-term use of aspirin  therapy    Marijuana use    Memory impairment    OSA (obstructive sleep apnea)    a.) does not require nocturnal PAP therapy   Osteopenia    a.) on oral bisphosphonate  (alendronate)   Palpitations    Personal history of chemotherapy    Postural hypotension    Pulmonary granuloma (HCC)    Smoker    STEMI involving right coronary artery (HCC) 08/09/2007   a.) LHC/PCI 08/09/2007: 100% mRCA (2.5 x 23 mm Mini Vision BMS)   T2DM (type 2 diabetes mellitus) (HCC)    Trichomoniasis    Vitamin D  insufficiency    Wears dentures    full upper (currently unable to wear)    Review of systems:  Otherwise negative.    Physical Exam  Gen: Alert, oriented. Appears stated age.  HEENT: PERRLA. Lungs: No respiratory distress CV: RRR Abd: soft, benign, no masses Ext: No edema    Planned procedures: Proceed with EGD. The patient understands the nature of the planned procedure, indications, risks, alternatives and potential complications including but not limited to bleeding, infection, perforation, damage to internal organs and possible oversedation/side effects from anesthesia. The patient agrees and gives consent to proceed.  Please refer to procedure notes for findings, recommendations and patient disposition/instructions.     Ole [name], MD [facility]

## 2024-02-23 LAB — SURGICAL PATHOLOGY

## 2024-03-03 ENCOUNTER — Ambulatory Visit: Attending: Surgery | Admitting: Occupational Therapy

## 2024-03-03 DIAGNOSIS — G5601 Carpal tunnel syndrome, right upper limb: Secondary | ICD-10-CM | POA: Diagnosis present

## 2024-03-03 DIAGNOSIS — M79642 Pain in left hand: Secondary | ICD-10-CM | POA: Diagnosis present

## 2024-03-03 DIAGNOSIS — M25532 Pain in left wrist: Secondary | ICD-10-CM | POA: Diagnosis present

## 2024-03-03 DIAGNOSIS — L905 Scar conditions and fibrosis of skin: Secondary | ICD-10-CM | POA: Insufficient documentation

## 2024-03-03 DIAGNOSIS — M6281 Muscle weakness (generalized): Secondary | ICD-10-CM | POA: Insufficient documentation

## 2024-03-03 NOTE — Therapy (Signed)
 OUTPATIENT OCCUPATIONAL THERAPY ORTHO TREATMENT/RECERT/ Discharge  Patient Name: Tonya Keith MRN: [id number] DOB:1961/02/13, 63 y.o., female Today's Date: 03/03/2024  PCP: Dr Idelia REFERRING PROVIDER: Dr Edie  END OF SESSION:  OT End of Session - 03/03/24 1039     Visit Number 9    Number of Visits 9    Date for Recertification  03/03/24    OT Start Time 1033    OT Stop Time 1110    OT Time Calculation (min) 37 min    Activity Tolerance Patient tolerated treatment well    Behavior During Therapy [facility] for tasks assessed/performed          Past Medical History:  Diagnosis Date   Anemia    Anginal pain    Aortic atherosclerosis    Arthritis    Bilateral carpal tunnel syndrome    Bradycardia    Breast cancer, left (HCC) 2008   a.) s/p surgical resection (mastectomy) + systemic chemotherapy   Cardiac murmur    Cerebral tonsillar ectopia    COPD (chronic obstructive pulmonary disease) (HCC)    Coronary artery disease    a.) PCI 08/09/2007: 100% mRCA (2.25 x 23 mm Mini Vision BMS)   Depression    H. pylori infection    Hemoglobin C trait    HLD (hyperlipidemia)    Hx of migraine headaches    Hypertension    Long-term use of aspirin  therapy    Marijuana use    Memory impairment    OSA (obstructive sleep apnea)    a.) does not require nocturnal PAP therapy   Osteopenia    a.) on oral bisphosphonate (alendronate)   Palpitations    Personal history of chemotherapy    Postural hypotension    Pulmonary granuloma (HCC)    Smoker    STEMI involving right coronary artery (HCC) 08/09/2007   a.) LHC/PCI 08/09/2007: 100% mRCA (2.5 x 23 mm Mini Vision BMS)   T2DM (type 2 diabetes mellitus) (HCC)    Trichomoniasis    Vitamin D  insufficiency    Wears dentures    full upper (currently unable to wear)   Past Surgical History:  Procedure Laterality Date   ABDOMINAL HYSTERECTOMY     ANKLE FRACTURE SURGERY Right    CARPAL TUNNEL RELEASE Left 01/27/2023   Procedure: CARPAL  TUNNEL RELEASE;  Surgeon: Kathlynn Sharper, MD;  Location: [facility];  Service: Orthopedics;  Laterality: Left;   CARPAL TUNNEL RELEASE Left 08/26/2023   Procedure: CARPAL TUNNEL RELEASE;  Surgeon: Edie Norleen PARAS, MD;  Location: [facility] ORS;  Service: Orthopedics;  Laterality: Left;   COLONOSCOPY N/A 11/30/2023   Procedure: COLONOSCOPY;  Surgeon: Maryruth Ole DASEN, MD;  Location: [facility] ENDOSCOPY;  Service: Endoscopy;  Laterality: N/A;   CORONARY ANGIOPLASTY WITH STENT PLACEMENT Left 08/09/2007   Procedure: CORONARY ANGIOPLASTY WITH STENT PLACEMENT; Location: [facility]; Surgeon: Margie Lovelace, MD   ESOPHAGOGASTRODUODENOSCOPY N/A 11/30/2023   Procedure: EGD (ESOPHAGOGASTRODUODENOSCOPY);  Surgeon: Maryruth Ole DASEN, MD;  Location: [facility] ENDOSCOPY;  Service: Endoscopy;  Laterality: N/A;   ESOPHAGOGASTRODUODENOSCOPY N/A 02/22/2024   Procedure: EGD (ESOPHAGOGASTRODUODENOSCOPY);  Surgeon: Maryruth Ole DASEN, MD;  Location: [facility] ENDOSCOPY;  Service: Endoscopy;  Laterality: N/A;   LEFT HEART CATH AND CORONARY ANGIOGRAPHY Left 09/07/2019   Procedure: LEFT HEART CATH AND CORONARY ANGIOGRAPHY;  Surgeon: Lovelace Cara BIRCH, MD;  Location: [facility] INVASIVE CV LAB;  Service: Cardiovascular;  Laterality: Left;   LEFT HEART CATH AND CORONARY ANGIOGRAPHY Left 06/12/2010   Procedure: LEFT HEART CATH AND CORONARY  ANGIOGRAPHY; Location: [facility]; Surgeon: Margie Lovelace, MD   LESION EXCISION WITH COMPLEX REPAIR Right 12/04/2020   Procedure: Excision right upper lip intraoral lesion;  Surgeon: Blair Mt, MD;  Location: [facility];  Service: ENT;  Laterality: Right;  Latex   MASTECTOMY Left 2008   MINOR EXCISION OF ORAL LESION N/A 10/02/2020   Procedure: excision of right upper lip mass, intraoral;  Surgeon: Blair Mt, MD;  Location: [facility];  Service: ENT;  Laterality: N/A;  Latex   WISDOM TOOTH EXTRACTION     Patient Active Problem List   Diagnosis Date Noted   Osteoarthritis of hips  (Bilateral) 11/03/2022   Lumbar radiculitis (L5, S1) (Bilateral) (L>R) 11/03/2022   Lumbosacral radiculopathy/radiculitis at S1 (Bilateral) (L>R) 11/03/2022   Lumbosacral radiculopathy/radiculitis at L5 (Bilateral) (L>R) 11/03/2022   Abnormal drug screen (09/22/2022) 09/25/2022   Chronic hip pain (Bilateral) (R>L) 09/22/2022   Decreased range of motion of hips (Bilateral) (R>L) 09/22/2022   Chronic low back pain (2ry area of Pain) (Bilateral) (R>L) w/o sciatica 09/22/2022   Decreased range of motion of lumbar spine 09/22/2022   Generalized osteoarthritis of multiple sites 09/22/2022   History of marijuana use 09/22/2022   Heart murmur 09/21/2022   Myocardial infarction (HCC) 09/21/2022   Chronic pain syndrome 09/21/2022   Pharmacologic therapy 09/21/2022   Disorder of skeletal system 09/21/2022   Problems influencing health status 09/21/2022   Memory impairment 08/15/2022   Type 2 diabetes mellitus (HCC) 02/04/2022   Other insomnia 08/28/2019   Hemoglobin C trait 03/22/2019   Chronic obstructive pulmonary disease (HCC) 03/06/2019   Coronary artery disease involving native coronary artery of native heart without angina pectoris 03/06/2019   Major depressive disorder, recurrent, moderate (HCC) 03/06/2019   Malignant neoplasm of left female breast (HCC) 03/06/2019   Osteopenia 03/06/2019   Vitamin D  insufficiency 03/06/2019   Hypothermia 12/03/2017   Chronic pain of lower extremity (1ry area of Pain) (Bilateral) 11/17/2017   Bilateral lower extremity edema 11/17/2017   Moderate tobacco use disorder 11/17/2017   Hypertension 11/17/2017   Hypercholesterolemia 11/17/2017    ONSET DATE: 08/26/23  REFERRING DIAG: L  CTR revision  THERAPY DIAG:  Carpal tunnel syndrome, right upper limb  Pain in left hand  Pain in left wrist  Scar condition and fibrosis of skin  Muscle weakness (generalized)  Rationale for Evaluation and Treatment: Rehabilitation  SUBJECTIVE:   SUBJECTIVE  STATEMENT: I am doing really well.  I am using my hand most everything except really like cleaning and gripping and pulling and pushing activities.  My scar is better my motion is better doing really good.  But this cold weather arthritis is not easy  pt accompanied by: self  PERTINENT HISTORY: Dr Edie appt 01/30/24 - refer to OT again - Denny Lave is a 63 y.o. female who presents for follow-up now 3 months status post a revision open left carpal tunnel release. The patient continues to note moderate to severe pain in her left wrist and hand with occasional radiation into the volar forearm region. She rates his pain as high as 8/10, and has been taking Tylenol  and soaking her hand in hot water with limited benefit. She did not attend formal occupational therapy as recommended at her last visit, stating that she called but they did not have any openings at the time. She has been trying to do some exercises on her own at home with limited benefit. She denies any reinjury to the wrist, and denies any fevers  or chills.  Regional left carpal tunnel release for September 24  PRECAUTIONS: None     WEIGHT BEARING RESTRICTIONS: No  PAIN:  Are you having pain? No pain   FALLS: Has patient fallen in last 6 months? No  LIVING ENVIRONMENT: Lives with: lives alone   PLOF: Independent in bathing and dressing as well as like cooking and laundry.  Some it takes her to get groceries.  Lives in the apartment.  1 step to get into  PATIENT GOALS: I want to get the pain better, my motion and strength so I can use it like my right hand -thank you help miliaris your  NEXT MD VISIT: ?  OBJECTIVE:  Note: Objective measures were completed at Evaluation unless otherwise noted.  HAND DOMINANCE: Right  ADLs: Unable to grip, squeeze, pick up, carry, push or pull with left hand  FUNCTIONAL OUTCOME MEASURES: PRWHE pain 42/50; function 26/50   UPPER EXTREMITY ROM:     Active ROM Right eval Left eval  12/25/23 L 8/5L/25  Shoulder flexion      Shoulder abduction      Shoulder adduction      Shoulder extension      Shoulder internal rotation      Shoulder external rotation      Elbow flexion      Elbow extension      Wrist flexion  68 80 100  Wrist extension  65 68 75  Wrist ulnar deviation  30    Wrist radial deviation  25    Wrist pronation  90    Wrist supination  90     (Blank rows = not tested) Pain with all wrist AROM - 7-8/10 pain ulnar wrist and hand into the forearm and hand at evaluation  Active ROM Right eval Left eval Left 12/25/23  Thumb MCP (0-60)     Thumb IP (0-80)     Thumb Radial abd/add (0-55)      Thumb Palmar abd/add (0-45)      Thumb Opposition to Small Finger      Index MCP (0-90)  80    Index PIP (0-100)  100    Index DIP (0-70)       Long MCP (0-90)   90    Long PIP (0-100)    100   Long DIP (0-70)       Ring MCP (0-90)    90   Ring PIP (0-100)   100    Ring DIP (0-70)       Little MCP (0-90)   90    Little PIP (0-100)   100    Little DIP (0-70)       (Blank rows = not tested)   HAND FUNCTION: Grip strength: Right: 56 lbs; Left: 19 lbs, Lateral pinch: Right: 14 lbs, Left: 8 lbs, and 3 point pinch: Right: 11 lbs, Left: 7 lbs Pain 7-8/10 on left hand with all grip and prehension 12/25/23 Grip strength: Right: 56 lbs; Left: 35 lbs, Lateral pinch: Right: 14 lbs, Left: 12 lbs, and 3 point pinch: Right: 11 lbs, Left: 8 lbs 01/12/24 Grip strength: Right: 56 lbs; Left: 45 lbs, Lateral pinch: Right: 14 lbs, Left: 12 lbs, and 3 point pinch: Right: 11 lbs, Left: 8 lbs 01/19/24 Grip strength: Right: 56 lbs; Left: 46 lbs, Lateral pinch: Right: 14 lbs, Left: 13 lbs, and 3 point pinch: Right: 11 lbs, Left: 11 lbs 03/03/24 Grip strength: Right: 60 lbs; Left: 52 lbs, Lateral pinch: Right:  14 lbs, Left: 15 lbs, and 3 point pinch: Right: 14 lbs, Left: 16 lbs  COORDINATION: Decreased because of increased pain and range of motion  SENSATION: Patient report numbness  in 2nd, 3rd and 4th digits will assess Semmes-Weinstein  Now patient reports normal sensation denies numbness  EDEMA: Over the ulnar wrist  COGNITION: Overall cognitive status: Within functional limits for tasks assessed     TREATMENT DATE: 03/03/24         Patient arrived after not being seen for about 4 weeks.  Patient was 4.  And was continued home with home program.   Patient arrived with reports of no pain and scar tissue improved.  Patient using it and bathing and dressing and like cooking and laundry with no issues. Continued to have some symptoms when really cleaning and doing heavy lifting and pushing and pulling. Recommend for patient to look into her health insurance and see if they have Silver sneakers that she can go to the [facility] and maybe workout at the pool Use the bicycle and do some classes. Patient agreed and motivated.   Modalities: Paraffin Time: 8 Location: Left hand and wrist To decrease stiffness  Recommended for patient to look into a paraffin bath for use at home during the winter to decrease stiffness and pain in bilateral hands and wrists. Information provided for patient.  And reviewed with her and educated her on the use of it.  As well as precautions.   Patient wrist active range of motion within normal limits Strength 5/5 for wrist flexion extension as well as ulnar radial deviation Supination pronation 4+/5 with some discomfort when using the thumb but?  Thumb [facility] arthritis Grip and prehension strength improved greatly.  See flowsheet.       PATIENT EDUCATION: Education details: findings of eval and HEP  Person educated: Patient Education method: Explanation, Demonstration, Tactile cues, Verbal cues, and Handouts Education comprehension: verbalized understanding, returned demonstration, verbal cues required, and needs further education     GOALS: Goals reviewed with patient? Yes  LONG TERM GOALS: Target date: 8 wks   Pain in left  hand decreased to less than 2/10 with composite fisting as well as using left hand in bathing dressing Baseline: Pain 7-8/10 at volar wrist ulnar hand and fifth digit.  With any attempts of flexion. Goal status: Met  2.  Left wrist and forearm AROM improve within normal limits symptom-free to use in bathing and dressing Baseline: AROM at the wrist left 65 extension 68 flexion forearm or radial ulnar deviation within normal limits but pain 7-8/10 at wrist ulnarly and hand and fifth digit Goal status: Met  3.  Left grip and prehension strength improved within normal range age for patient symptom-free to be able to squeeze a washcloth pick up a drink pull up pants perform hygiene Baseline:  Grip strength: Right: 56 lbs; Left: 19 lbs, Lateral pinch: Right: 14 lbs, Left: 8 lbs, and 3 point pinch: Right: 11 lbs, Left: 7 lbs Goal status: Met  4.  Left hand and wrist improved for patient to be able to push and pull door, turn a doorknob cut food open container symptom-free Baseline: Pain 7-8/10 with any attempts of making composite fist as well as wrist flexion extension decreased pain at volar and ulnar wrist as well as ulnar hand and fifth digit Goal status met   4.  Left hand and wrist improve on PRWHE by more than 30 points  Baseline: Pain on PRWHE at evaluation 42/50 ;  Now PRWHE 4/50 goal status met   4.  Patient function of using left hand on PRWHE improve with more than 15 points Baseline: PRWHE score for function at eval is 26/50  NOW 7/50 Goal status: Met    ASSESSMENT:  CLINICAL IMPRESSION: Patient seen for left carpal tunnel revision on 08/26/2023 by Dr.Poggi -patient present at OT evaluation with pain 7-8/10 at the ulnar and volar wrist as well as ulnar hand and fifth digit.  Patient very protective and guarding of left hand use and attempts of range of motion.  Patient limited in wrist flexion extension as well as grip and prehension strength.  NOW patient made great progress from  start of care and pain and increased motion and strength.  Patient returns today after doing home program for about 4 weeks.  Patient was sick also during that time.  Patient is active range of motion within normal limits and strength improved greatly.  Grip and pinch strength improved greatly.  Patient agreement to be discharged met all goals.  Recommend for patient to transition and look into her health insurance if they will cover Silver sneakers at the [facility] for her to participate in some pool exercise classes.  Patient in agreement and will check on it. PERFORMANCE DEFICITS: in functional skills including ADLs, IADLs, ROM, strength, pain, flexibility, decreased knowledge of use of DME, and UE functional use,   and psychosocial skills including environmental adaptation and routines and behaviors.   IMPAIRMENTS: are limiting patient from ADLs, IADLs, rest and sleep, play, leisure, and social participation.   COMORBIDITIES: has no other co-morbidities that affects occupational performance. Patient will benefit from skilled OT to address above impairments and improve overall function.  MODIFICATION OR ASSISTANCE TO COMPLETE EVALUATION: No modification of tasks or assist necessary to complete an evaluation.  OT OCCUPATIONAL PROFILE AND HISTORY: Problem focused assessment: Including review of records relating to presenting problem.  CLINICAL DECISION MAKING: LOW - limited treatment options, no task modification necessary  REHAB POTENTIAL: Good for goals  EVALUATION COMPLEXITY: Low      PLAN:  OT FREQUENCY: 1 visit  OT DURATION: 1 week  PLANNED INTERVENTIONS: 97168 OT Re-evaluation, 97535 self care/ADL training, 02889 therapeutic exercise, 97530 therapeutic activity, 97140 manual therapy, 97035 ultrasound, 97018 paraffin, 02960 fluidotherapy, 97034 contrast bath, scar mobilization, passive range of motion, patient/family education, and DME and/or AE instructions    CONSULTED AND AGREED  WITH PLAN OF CARE: Patient     Ancel Peters, OTR/L, CLT 03/03/2024, 12:49 PM

## 2024-03-24 ENCOUNTER — Other Ambulatory Visit: Payer: Self-pay | Admitting: Gastroenterology

## 2024-03-24 DIAGNOSIS — R1013 Epigastric pain: Secondary | ICD-10-CM

## 2024-03-31 ENCOUNTER — Encounter: Payer: Self-pay | Admitting: *Deleted

## 2024-04-11 HISTORY — DX: Age-related osteoporosis without current pathological fracture: M81.0

## 2024-04-11 HISTORY — DX: Malignant neoplasm of unspecified site of left female breast: C50.912

## 2024-04-11 HISTORY — DX: Peripheral vascular disease, unspecified: I73.9

## 2024-04-11 HISTORY — DX: Major depressive disorder, recurrent, moderate: F33.1

## 2024-04-11 HISTORY — DX: Radiculopathy, lumbar region: M54.16

## 2024-04-11 HISTORY — DX: Carpal tunnel syndrome, left upper limb: G56.02

## 2024-04-11 HISTORY — DX: Type 2 diabetes mellitus without complications: E11.9

## 2024-04-11 HISTORY — DX: Bilateral primary osteoarthritis of hip: M16.0

## 2024-04-15 ENCOUNTER — Ambulatory Visit
Admission: RE | Admit: 2024-04-15 | Discharge: 2024-04-15 | Disposition: A | Discharge status: Home / Self Care | Source: Ambulatory Visit | Attending: Gastroenterology | Admitting: Gastroenterology

## 2024-04-15 DIAGNOSIS — R1013 Epigastric pain: Secondary | ICD-10-CM | POA: Insufficient documentation

## 2024-04-15 MED ORDER — TECHNETIUM TC 99M SULFUR COLLOID
2.0000 | Freq: Once | INTRAVENOUS | Status: AC | PRN
  Administered 2024-04-15: 2 via INTRAVENOUS

## 2024-05-09 ENCOUNTER — Ambulatory Visit
Admission: RE | Admit: 2024-05-09 | Discharge: 2024-05-09 | Disposition: A | Discharge status: Home / Self Care | Source: Home / Self Care | Attending: Gastroenterology | Admitting: Gastroenterology

## 2024-05-09 ENCOUNTER — Ambulatory Visit

## 2024-05-09 ENCOUNTER — Encounter
Admission: RE | Disposition: A | Discharge status: Home / Self Care | Payer: Self-pay | Source: Home / Self Care | Attending: Gastroenterology

## 2024-05-09 DIAGNOSIS — E119 Type 2 diabetes mellitus without complications: Secondary | ICD-10-CM | POA: Insufficient documentation

## 2024-05-09 DIAGNOSIS — I10 Essential (primary) hypertension: Secondary | ICD-10-CM | POA: Insufficient documentation

## 2024-05-09 DIAGNOSIS — I2510 Atherosclerotic heart disease of native coronary artery without angina pectoris: Secondary | ICD-10-CM | POA: Insufficient documentation

## 2024-05-09 DIAGNOSIS — J449 Chronic obstructive pulmonary disease, unspecified: Secondary | ICD-10-CM | POA: Insufficient documentation

## 2024-05-09 DIAGNOSIS — G4733 Obstructive sleep apnea (adult) (pediatric): Secondary | ICD-10-CM | POA: Insufficient documentation

## 2024-05-09 DIAGNOSIS — K640 First degree hemorrhoids: Secondary | ICD-10-CM | POA: Diagnosis not present

## 2024-05-09 DIAGNOSIS — Z79899 Other long term (current) drug therapy: Secondary | ICD-10-CM | POA: Diagnosis not present

## 2024-05-09 DIAGNOSIS — F17200 Nicotine dependence, unspecified, uncomplicated: Secondary | ICD-10-CM | POA: Insufficient documentation

## 2024-05-09 DIAGNOSIS — I252 Old myocardial infarction: Secondary | ICD-10-CM | POA: Insufficient documentation

## 2024-05-09 DIAGNOSIS — Z1211 Encounter for screening for malignant neoplasm of colon: Secondary | ICD-10-CM | POA: Diagnosis present

## 2024-05-09 DIAGNOSIS — Z9851 Tubal ligation status: Secondary | ICD-10-CM | POA: Insufficient documentation

## 2024-05-09 DIAGNOSIS — F32A Depression, unspecified: Secondary | ICD-10-CM | POA: Diagnosis not present

## 2024-05-09 DIAGNOSIS — D128 Benign neoplasm of rectum: Secondary | ICD-10-CM | POA: Insufficient documentation

## 2024-05-09 HISTORY — PX: POLYPECTOMY: SHX149

## 2024-05-09 HISTORY — PX: COLONOSCOPY: SHX5424

## 2024-05-09 SURGERY — COLONOSCOPY
Anesthesia: General

## 2024-05-09 MED ORDER — SODIUM CHLORIDE 0.9 % IV SOLN
INTRAVENOUS | Status: DC
  Administered 2024-05-09: 09:00:00 20 mL/h via INTRAVENOUS

## 2024-05-09 MED ORDER — PROPOFOL 10 MG/ML IV BOLUS
INTRAVENOUS | Status: DC | PRN
  Administered 2024-05-09: 09:00:00 80 mg via INTRAVENOUS
  Administered 2024-05-09: 09:00:00 90 ug/kg/min via INTRAVENOUS

## 2024-05-09 NOTE — Transfer of Care (Signed)
 Immediate Anesthesia Transfer of Care Note  Patient: Tonya Keith  Procedure(s) Performed: COLONOSCOPY POLYPECTOMY, INTESTINE  Patient Location: PACU  Anesthesia Type:General  Level of Consciousness: awake and alert   Airway & Oxygen Therapy: Patient Spontanous Breathing  Post-op Assessment: Report given to RN  Post vital signs: Reviewed and stable  Last Vitals:  Vitals Value Taken Time  BP    Temp    Pulse    Resp    SpO2      Last Pain:  Vitals:   05/09/24 0856  TempSrc: Temporal  PainSc: 0-No pain         Complications: No notable events documented.

## 2024-05-09 NOTE — Anesthesia Postprocedure Evaluation (Signed)
 Anesthesia Post Note  Patient: Tonya Keith  Procedure(s) Performed: COLONOSCOPY POLYPECTOMY, INTESTINE  Patient location during evaluation: PACU Anesthesia Type: General Level of consciousness: awake and awake and alert Pain management: satisfactory to patient Vital Signs Assessment: post-procedure vital signs reviewed and stable Respiratory status: spontaneous breathing Cardiovascular status: stable Anesthetic complications: no   No notable events documented.   Last Vitals:  Vitals:   05/09/24 1012 05/09/24 1017  BP:  132/74  Pulse: 64 61  Resp: 13 19  Temp:    SpO2: 100% 100%    Last Pain:  Vitals:   05/09/24 1008  TempSrc:   PainSc: 0-No pain                 VAN STAVEREN,Salam Micucci

## 2024-05-09 NOTE — Interval H&P Note (Signed)
 History and Physical Interval Note:  05/09/2024 9:16 AM  Tonya Keith  has presented today for surgery, with the diagnosis of CCA SCREEN.  The various methods of treatment have been discussed with the patient and family. After consideration of risks, benefits and other options for treatment, the patient has consented to  Procedures: COLONOSCOPY (N/A) as a surgical intervention.  The patient's history has been reviewed, patient examined, no change in status, stable for surgery.  I have reviewed the patient's chart and labs.  Questions were answered to the patient's satisfaction.     Ole [name]  Ok to proceed with colonoscopy

## 2024-05-09 NOTE — H&P (Signed)
 Outpatient short stay form Pre-procedure 05/09/2024  Ole [name], MD  Primary Physician: Zachary Idelia LABOR, MD  Reason for visit:  Screening  History of present illness:    63 y/o lady with history of hypertension, CAD, and arthritis here for colonoscopy for screening. No blood thinners. No family history of GI malignancies. History of tubal ligation. Has had previous colonoscopy but unknown how long ago.   Current Medications[1]  Medications Prior to Admission  Medication Sig Dispense Refill Last Dose/Taking   alendronate (FOSAMAX) 70 MG tablet Take 70 mg by mouth once a week.   Past Week   amLODipine (NORVASC) 10 MG tablet Take 10 mg by mouth daily.    Past Week   amoxicillin  (AMOXIL ) 875 MG tablet Take 1 tablet (875 mg total) by mouth 2 (two) times daily. 14 tablet 0 Past Week   aspirin  EC 81 MG tablet Take 81 mg by mouth daily.    Past Week   azelastine (ASTELIN) 0.1 % nasal spray Place 2 sprays into both nostrils 2 (two) times daily as needed for allergies. Use in each nostril as directed   Past Week   cetirizine (ZYRTEC) 10 MG tablet Take 10 mg by mouth daily as needed for allergies.   Past Week   escitalopram (LEXAPRO) 20 MG tablet Take 20 mg by mouth daily.   Past Week   gabapentin (NEURONTIN) 300 MG capsule Take 300 mg by mouth daily as needed (pain).   Past Week   HYDROcodone -acetaminophen  (NORCO/VICODIN) 5-325 MG tablet Take 1-2 tablets by mouth every 4 (four) hours as needed for moderate pain (pain score 4-6) or severe pain (pain score 7-10). 30 tablet 0 Past Week   ipratropium (ATROVENT) 0.03 % nasal spray Place 2 sprays into both nostrils 2 (two) times daily as needed for rhinitis.   Past Week   isosorbide mononitrate (IMDUR) 30 MG 24 hr tablet Take 30 mg by mouth daily.   Past Week   lamoTRIgine (LAMICTAL) 100 MG tablet Take 100 mg by mouth 2 (two) times daily.   Past Week   lovastatin (MEVACOR) 40 MG tablet Take 40 mg by mouth daily.    Past Week   metoprolol  succinate (TOPROL-XL) 100 MG 24 hr tablet Take 50 mg by mouth daily.   Past Week   naproxen (NAPROSYN) 500 MG tablet Take 500 mg by mouth daily as needed for moderate pain (pain score 4-6).   Past Week   pregabalin (LYRICA) 75 MG capsule Take 75 mg by mouth daily.   Past Week   Tiotropium Bromide Monohydrate 2.5 MCG/ACT AERS Inhale 1 puff into the lungs daily as needed (shortness of breath).   Past Week   Vitamin D , Ergocalciferol , (DRISDOL) 1.25 MG (50000 UNIT) CAPS capsule Take 50,000 Units by mouth every Wednesday.   Past Week   albuterol  (VENTOLIN  HFA) 108 (90 Base) MCG/ACT inhaler Inhale 2 puffs into the lungs every 6 (six) hours as needed for wheezing or shortness of breath.   Evening   nitroGLYCERIN (NITROSTAT) 0.4 MG SL tablet Place 0.4 mg under the tongue every 5 (five) minutes as needed for chest pain.        Allergies[2]   Past Medical History:  Diagnosis Date   Anemia    Anginal pain    Aortic atherosclerosis    Arthritis    Bilateral carpal tunnel syndrome    Bradycardia    Breast cancer, left (HCC) 2008   a.) s/p surgical resection (mastectomy) + systemic chemotherapy  Cardiac murmur    Carpal tunnel syndrome on left    Cerebral tonsillar ectopia    COPD (chronic obstructive pulmonary disease) (HCC)    Coronary artery disease    a.) PCI 08/09/2007: 100% mRCA (2.25 x 23 mm Mini Vision BMS)   Depression    H. pylori infection    Hemoglobin C trait    HLD (hyperlipidemia)    Hx of migraine headaches    Hypertension    Intermittent claudication    Long-term use of aspirin  therapy    Lumbar radiculitis    Major depressive disorder, recurrent, moderate (HCC)    Malignant neoplasm of left female breast (HCC)    Marijuana use    Memory impairment    OSA (obstructive sleep apnea)    a.) does not require nocturnal PAP therapy   Osteopenia    a.) on oral bisphosphonate (alendronate)   Osteoporosis    Palpitations    Personal history of chemotherapy    Postural  hypotension    Primary osteoarthritis of both hips    Pulmonary granuloma (HCC)    Smoker    STEMI involving right coronary artery (HCC) 08/09/2007   a.) LHC/PCI 08/09/2007: 100% mRCA (2.5 x 23 mm Mini Vision BMS)   T2DM (type 2 diabetes mellitus) (HCC)    Trichomoniasis    Type 2 diabetes mellitus (HCC)    Vitamin D  insufficiency    Wears dentures    full upper (currently unable to wear)    Review of systems:  Otherwise negative.    Physical Exam  Gen: Alert, oriented. Appears stated age.  HEENT: PERRLA. Lungs: No respiratory distress CV: RRR Abd: soft, benign, no masses Ext: No edema    Planned procedures: Proceed with colonoscopy. The patient understands the nature of the planned procedure, indications, risks, alternatives and potential complications including but not limited to bleeding, infection, perforation, damage to internal organs and possible oversedation/side effects from anesthesia. The patient agrees and gives consent to proceed.  Please refer to procedure notes for findings, recommendations and patient disposition/instructions.     Ole [name], MD [facility]         [1]  Current Facility-Administered Medications:    0.9 %  sodium chloride  infusion, , Intravenous, Continuous, Oluwatosin Higginson, Ole ONEIDA, MD, Last Rate: 20 mL/hr at 05/09/24 0909, 20 mL/hr at 05/09/24 0909 [2]  Allergies Allergen Reactions   Ace Inhibitors Cough and Other (See Comments)   Pollen Extract    Latex Itching and Rash

## 2024-05-09 NOTE — Op Note (Signed)
 [facility] Gastroenterology Patient Name: Tonya Keith Procedure Date: 05/09/2024 9:13 AM MRN: [id number] Account #: [id number] Date of Birth: 1960-11-13 Admit Type: Outpatient Age: 63 Room: [facility] ENDO ROOM 3 Gender: Female Note Status: Finalized Instrument Name: Peds Colonoscope [id number] Procedure:             Colonoscopy Indications:           Screening for colorectal malignant neoplasm Providers:             Ole Schick MD, MD Referring MD:          Ole Schick MD, MD (Referring MD), Sionne A.                         Zachary MD, MD (Referring MD) Medicines:             Monitored Anesthesia Care Complications:         No immediate complications. Estimated blood loss:                         Minimal. Procedure:             Pre-Anesthesia Assessment:                        - Prior to the procedure, a History and Physical was                         performed, and patient medications and allergies were                         reviewed. The risks and benefits of the procedure and                         the sedation options and risks were discussed with the                         patient. All questions were answered and informed                         consent was obtained. Patient identification and                         proposed procedure were verified by the physician, the                         nurse, the anesthesiologist, the anesthetist and the                         technician in the endoscopy suite. Mental Status                         Examination: alert and oriented. Airway Examination:                         normal oropharyngeal airway and neck mobility.                         Respiratory Examination: clear to auscultation. CV  Examination: normal. Prophylactic Antibiotics: The                         patient does not require prophylactic antibiotics.                         Prior Anticoagulants: The patient has taken  no                         anticoagulant or antiplatelet agents. ASA Grade                         Assessment: III - A patient with severe systemic                         disease. After reviewing the risks and benefits, the                         patient was deemed in satisfactory condition to                         undergo the procedure. The anesthesia plan was to use                         monitored anesthesia care (MAC). Immediately prior to                         administration of medications, the patient was                         re-assessed for adequacy to receive sedatives. The                         heart rate, respiratory rate, oxygen saturations,                         blood pressure, adequacy of pulmonary ventilation, and                         response to care were monitored throughout the                         procedure. The physical status of the patient was                         re-assessed after the procedure.                        After obtaining informed consent, the colonoscope was                         passed under direct vision. Throughout the procedure,                         the patient's blood pressure, pulse, and oxygen                         saturations were monitored continuously. The  Colonoscope was introduced through the anus and                         advanced to the the cecum, identified by appendiceal                         orifice and ileocecal valve. The colonoscopy was                         performed without difficulty. The patient tolerated                         the procedure well. The quality of the bowel                         preparation was good. The ileocecal valve, appendiceal                         orifice, and rectum were photographed. Findings:      The perianal and digital rectal examinations were normal.      A 2 mm polyp was found in the rectum. The polyp was sessile. The polyp       was  removed with a cold snare. Resection and retrieval were complete.       Estimated blood loss was minimal.      Internal hemorrhoids were found during retroflexion. The hemorrhoids       were Grade I (internal hemorrhoids that do not prolapse).      The exam was otherwise without abnormality on direct and retroflexion       views. Impression:            - One 2 mm polyp in the rectum, removed with a cold                         snare. Resected and retrieved.                        - Internal hemorrhoids.                        - The examination was otherwise normal on direct and                         retroflexion views. Recommendation:        - Discharge patient to home.                        - Resume previous diet.                        - Continue present medications.                        - Await pathology results.                        - Repeat colonoscopy in 7 years for surveillance.                        - Return to referring physician as previously  scheduled. Procedure Code(s):     --- Professional ---                        [phone], Colonoscopy, flexible; with removal of                         tumor(s), polyp(s), or other lesion(s) by snare                         technique Diagnosis Code(s):     --- Professional ---                        Z12.11, Encounter for screening for malignant neoplasm                         of colon                        D12.8, Benign neoplasm of rectum                        K64.0, First degree hemorrhoids CPT copyright 2022 [employer]. All rights reserved. The codes documented in this report are preliminary and upon coder review may  be revised to meet current compliance requirements. Ole Schick MD, MD 05/09/2024 9:52:03 AM Number of Addenda: 0 Note Initiated On: 05/09/2024 9:13 AM Scope Withdrawal Time: 0 hours 10 minutes 2 seconds  Total Procedure Duration: 0 hours 14 minutes 8 seconds   Estimated Blood Loss:  Estimated blood loss was minimal.      [facility]

## 2024-05-09 NOTE — Anesthesia Preprocedure Evaluation (Signed)
 Anesthesia Evaluation  Patient identified by MRN, date of birth, ID band Patient awake    Reviewed: Allergy & Precautions, NPO status , Patient's Chart, lab work & pertinent test results, reviewed documented beta blocker date and time   Airway Mallampati: II  TM Distance: >3 FB Neck ROM: Full    Dental  (+) Edentulous Upper, Missing   Pulmonary neg pulmonary ROS, sleep apnea , COPD, Current Smoker and Patient abstained from smoking.   Pulmonary exam normal  + decreased breath sounds      Cardiovascular Exercise Tolerance: Good hypertension, Pt. on medications + CAD and + Past MI  negative cardio ROS Normal cardiovascular exam Rhythm:Regular Rate:Normal     Neuro/Psych    Depression    negative neurological ROS  negative psych ROS   GI/Hepatic negative GI ROS, Neg liver ROS,,,  Endo/Other  negative endocrine ROSdiabetes, Type 2    Renal/GU negative Renal ROS  negative genitourinary   Musculoskeletal   Abdominal  (+) + scaphoid   Peds negative pediatric ROS (+)  Hematology negative hematology ROS (+) Blood dyscrasia, anemia   Anesthesia Other Findings Past Medical History: No date: Anemia No date: Anginal pain No date: Aortic atherosclerosis No date: Arthritis No date: Bilateral carpal tunnel syndrome No date: Bradycardia 2008: Breast cancer, left (HCC)     Comment:  a.) s/p surgical resection (mastectomy) + systemic               chemotherapy No date: Cardiac murmur No date: Carpal tunnel syndrome on left No date: Cerebral tonsillar ectopia No date: COPD (chronic obstructive pulmonary disease) (HCC) No date: Coronary artery disease     Comment:  a.) PCI 08/09/2007: 100% mRCA (2.25 x 23 mm Mini Vision               BMS) No date: Depression No date: H. 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 No date: HLD (hyperlipidemia) No date: Hx of migraine headaches No date: Hypertension No date: Intermittent  claudication No date: Long-term use of aspirin  therapy No date: Lumbar radiculitis No date: Major depressive disorder, recurrent, moderate (HCC) No date: Malignant neoplasm of left female breast (HCC) No date: Marijuana use No date: Memory impairment No date: OSA (obstructive sleep apnea)     Comment:  a.) does not require nocturnal PAP therapy No date: Osteopenia     Comment:  a.) on oral bisphosphonate (alendronate) No date: Osteoporosis No date: Palpitations No date: Personal history of chemotherapy No date: Postural hypotension No date: Primary osteoarthritis of both hips No date: Pulmonary granuloma (HCC) No date: Smoker 08/09/2007: STEMI involving right coronary artery (HCC)     Comment:  a.) LHC/PCI 08/09/2007: 100% mRCA (2.5 x 23 mm Mini               Vision BMS) No date: T2DM (type 2 diabetes mellitus) (HCC) No date: Trichomoniasis No date: Type 2 diabetes mellitus (HCC) No date: Vitamin D  insufficiency No date: Wears dentures     Comment:  full upper (currently unable to wear)  Past Surgical History: No date: ABDOMINAL HYSTERECTOMY No date: ANKLE FRACTURE SURGERY; Right 01/27/2023: CARPAL TUNNEL RELEASE; Left     Comment:  Procedure: CARPAL TUNNEL RELEASE;  Surgeon: Kathlynn Sharper, MD;  Location: [facility];  Service:               Orthopedics;  Laterality: Left; 08/26/2023: CARPAL TUNNEL RELEASE; Left  Comment:  Procedure: CARPAL TUNNEL RELEASE;  Surgeon: Edie Norleen PARAS, MD;  Location: [facility] ORS;  Service: Orthopedics;                Laterality: Left; 11/30/2023: COLONOSCOPY; N/A     Comment:  Procedure: COLONOSCOPY;  Surgeon: Maryruth Ole DASEN,               MD;  Location: [facility] ENDOSCOPY;  Service: Endoscopy;                Laterality: N/A; 08/09/2007: CORONARY ANGIOPLASTY WITH STENT PLACEMENT; Left     Comment:  Procedure: CORONARY ANGIOPLASTY WITH STENT PLACEMENT;               Location: [facility]; Surgeon: Margie Lovelace,  MD 11/30/2023: ESOPHAGOGASTRODUODENOSCOPY; N/A     Comment:  Procedure: EGD (ESOPHAGOGASTRODUODENOSCOPY);  Surgeon:               Maryruth Ole DASEN, MD;  Location: [facility] ENDOSCOPY;                Service: Endoscopy;  Laterality: N/A; 02/22/2024: ESOPHAGOGASTRODUODENOSCOPY; N/A     Comment:  Procedure: EGD (ESOPHAGOGASTRODUODENOSCOPY);  Surgeon:               Maryruth Ole DASEN, MD;  Location: [facility] ENDOSCOPY;                Service: Endoscopy;  Laterality: N/A; No date: EYE SURGERY 09/07/2019: LEFT HEART CATH AND CORONARY ANGIOGRAPHY; Left     Comment:  Procedure: LEFT HEART CATH AND CORONARY ANGIOGRAPHY;                Surgeon: Lovelace Cara BIRCH, MD;  Location: [facility] INVASIVE              CV LAB;  Service: Cardiovascular;  Laterality: Left; 06/12/2010: LEFT HEART CATH AND CORONARY ANGIOGRAPHY; Left     Comment:  Procedure: LEFT HEART CATH AND CORONARY ANGIOGRAPHY;               Location: [facility]; Surgeon: Margie Lovelace, MD 12/04/2020: LESION EXCISION WITH COMPLEX REPAIR; Right     Comment:  Procedure: Excision right upper lip intraoral lesion;                Surgeon: Blair Mt, MD;  Location: [facility]               [facility];  Service: ENT;  Laterality: Right;  Latex 2008: MASTECTOMY; Left 10/02/2020: MINOR EXCISION OF ORAL LESION; N/A     Comment:  Procedure: excision of right upper lip mass, intraoral;               Surgeon: Blair Mt, MD;  Location: [facility]               [facility];  Service: ENT;  Laterality: N/A;  Latex No date: PCI and stent RCA; N/A No date: WISDOM TOOTH EXTRACTION  BMI    Body Mass Index: 18.18 kg/m      Reproductive/Obstetrics negative OB ROS                              Anesthesia Physical Anesthesia Plan  ASA: 3  Anesthesia Plan: General   Post-op Pain Management:    Induction: Intravenous  PONV Risk Score and Plan: Propofol  infusion and TIVA  Airway Management Planned: Natural Airway  and Nasal  Cannula  Additional Equipment:   Intra-op Plan:   Post-operative Plan:   Informed Consent: I have reviewed the patients History and Physical, chart, labs and discussed the procedure including the risks, benefits and alternatives for the proposed anesthesia with the patient or authorized representative who has indicated his/her understanding and acceptance.     Dental Advisory Given  Plan Discussed with: CRNA  Anesthesia Plan Comments:         Anesthesia Quick Evaluation

## 2024-05-10 ENCOUNTER — Encounter: Payer: Self-pay | Admitting: Gastroenterology

## 2024-05-10 LAB — SURGICAL PATHOLOGY

## 2024-05-13 ENCOUNTER — Other Ambulatory Visit

## 2024-06-03 ENCOUNTER — Ambulatory Visit
Admission: RE | Admit: 2024-06-03 | Discharge: 2024-06-03 | Disposition: A | Source: Ambulatory Visit | Attending: Gastroenterology | Admitting: Gastroenterology

## 2024-06-03 MED ORDER — TECHNETIUM TC 99M SULFUR COLLOID
2.0000 | Freq: Once | INTRAVENOUS | Status: AC | PRN
  Administered 2024-06-03: 0.5 via ORAL

## 2024-06-17 ENCOUNTER — Other Ambulatory Visit: Payer: Self-pay | Admitting: Specialist

## 2024-06-17 DIAGNOSIS — J439 Emphysema, unspecified: Secondary | ICD-10-CM

## 2024-06-17 DIAGNOSIS — F17210 Nicotine dependence, cigarettes, uncomplicated: Secondary | ICD-10-CM

## 2024-06-28 ENCOUNTER — Ambulatory Visit
Admission: RE | Admit: 2024-06-28 | Discharge: 2024-06-28 | Disposition: A | Source: Ambulatory Visit | Attending: Specialist | Admitting: Specialist

## 2024-06-28 DIAGNOSIS — J439 Emphysema, unspecified: Secondary | ICD-10-CM

## 2024-06-28 DIAGNOSIS — F17210 Nicotine dependence, cigarettes, uncomplicated: Secondary | ICD-10-CM
